# Patient Record
Sex: Female | Born: 1985 | Race: White | Hispanic: No | Marital: Single | Smoking: Current some day smoker
Health system: Southern US, Community
[De-identification: ages and names within clinical notes are randomized; demographics above are authoritative.]

## PROBLEM LIST (undated history)

## (undated) DIAGNOSIS — O09299 Supervision of pregnancy with other poor reproductive or obstetric history, unspecified trimester: Secondary | ICD-10-CM

## (undated) DIAGNOSIS — Z5189 Encounter for other specified aftercare: Secondary | ICD-10-CM

## (undated) DIAGNOSIS — F32A Depression, unspecified: Secondary | ICD-10-CM

## (undated) DIAGNOSIS — F319 Bipolar disorder, unspecified: Secondary | ICD-10-CM

## (undated) DIAGNOSIS — N200 Calculus of kidney: Secondary | ICD-10-CM

## (undated) DIAGNOSIS — T8859XA Other complications of anesthesia, initial encounter: Secondary | ICD-10-CM

## (undated) DIAGNOSIS — A609 Anogenital herpesviral infection, unspecified: Secondary | ICD-10-CM

## (undated) DIAGNOSIS — J45909 Unspecified asthma, uncomplicated: Secondary | ICD-10-CM

## (undated) DIAGNOSIS — F419 Anxiety disorder, unspecified: Secondary | ICD-10-CM

## (undated) DIAGNOSIS — Z87442 Personal history of urinary calculi: Secondary | ICD-10-CM

## (undated) HISTORY — PX: MULTIPLE TOOTH EXTRACTIONS: SHX2053

## (undated) HISTORY — PX: LITHOTRIPSY: SUR834

## (undated) HISTORY — PX: BREAST BIOPSY: SHX20

---

## 2007-06-13 DIAGNOSIS — O139 Gestational [pregnancy-induced] hypertension without significant proteinuria, unspecified trimester: Secondary | ICD-10-CM

## 2008-03-19 ENCOUNTER — Ambulatory Visit: Payer: Self-pay | Admitting: Internal Medicine

## 2008-03-24 ENCOUNTER — Ambulatory Visit (HOSPITAL_COMMUNITY): Admission: RE | Admit: 2008-03-24 | Discharge: 2008-03-24 | Payer: Self-pay | Admitting: Internal Medicine

## 2008-04-05 ENCOUNTER — Ambulatory Visit (HOSPITAL_COMMUNITY): Admission: RE | Admit: 2008-04-05 | Discharge: 2008-04-05 | Payer: Self-pay | Admitting: Internal Medicine

## 2008-04-05 ENCOUNTER — Ambulatory Visit: Payer: Self-pay | Admitting: Internal Medicine

## 2008-04-05 ENCOUNTER — Encounter: Payer: Self-pay | Admitting: Internal Medicine

## 2008-04-15 ENCOUNTER — Encounter (HOSPITAL_COMMUNITY): Admission: RE | Admit: 2008-04-15 | Discharge: 2008-05-15 | Payer: Self-pay | Admitting: Internal Medicine

## 2008-07-07 ENCOUNTER — Ambulatory Visit (HOSPITAL_COMMUNITY): Admission: RE | Admit: 2008-07-07 | Discharge: 2008-07-07 | Payer: Self-pay | Admitting: Obstetrics & Gynecology

## 2009-03-07 ENCOUNTER — Other Ambulatory Visit: Admission: RE | Admit: 2009-03-07 | Discharge: 2009-03-07 | Payer: Self-pay | Admitting: Obstetrics and Gynecology

## 2009-03-24 ENCOUNTER — Emergency Department (HOSPITAL_COMMUNITY): Admission: EM | Admit: 2009-03-24 | Discharge: 2009-03-25 | Payer: Self-pay | Admitting: Emergency Medicine

## 2009-03-29 ENCOUNTER — Emergency Department (HOSPITAL_COMMUNITY): Admission: EM | Admit: 2009-03-29 | Discharge: 2009-03-29 | Payer: Self-pay | Admitting: Emergency Medicine

## 2010-08-29 ENCOUNTER — Ambulatory Visit (HOSPITAL_COMMUNITY): Admission: RE | Admit: 2010-08-29 | Discharge: 2010-08-29 | Payer: Self-pay | Admitting: Orthopaedic Surgery

## 2010-11-05 ENCOUNTER — Encounter: Payer: Self-pay | Admitting: Internal Medicine

## 2011-01-22 LAB — URINALYSIS, ROUTINE W REFLEX MICROSCOPIC
Bilirubin Urine: NEGATIVE
Bilirubin Urine: NEGATIVE
Glucose, UA: NEGATIVE mg/dL
Hgb urine dipstick: NEGATIVE
Ketones, ur: NEGATIVE mg/dL
Ketones, ur: NEGATIVE mg/dL
Nitrite: NEGATIVE
Nitrite: POSITIVE — AB
Protein, ur: NEGATIVE mg/dL
Specific Gravity, Urine: <1.005 — ABNORMAL LOW (ref 1.005–1.030)
pH: 6 (ref 5.0–8.0)
pH: 6 (ref 5.0–8.0)

## 2011-01-22 LAB — URINE CULTURE
Colony Count: 45000
Colony Count: >=100000

## 2011-01-22 LAB — URINE MICROSCOPIC-ADD ON

## 2011-01-22 LAB — PREGNANCY, URINE: Preg Test, Ur: NEGATIVE

## 2011-02-27 NOTE — Op Note (Signed)
NAMELEAR, CARSTENS              ACCOUNT NO.:  [id number]   MEDICAL RECORD NO.:  [id number]          PATIENT TYPE:  AMB   LOCATION:  DAY                           FACILITY:  [facility]   PHYSICIAN:  Lazaro Arms, M.D.   DATE OF BIRTH:  1986/01/10   DATE OF PROCEDURE:  07/07/2008  DATE OF DISCHARGE:                               OPERATIVE REPORT   PREOPERATIVE DIAGNOSIS:  High-grade dysplasia of the cervix.   POSTOPERATIVE DIAGNOSIS:  High-grade dysplasia of the cervix.   SURGEON:  Lazaro Arms, MD   ANESTHESIA:  General endotracheal.   FINDINGS:  The patient had a colposcopy in the office, which biopsy came  back as being high-grade dysplasia.  There is an adequate colposcopy  lesion easily seen.   DESCRIPTION OF OPERATION:  The patient was taken to the operating room  and placed in the lithotomy position after undergoing general  endotracheal anesthesia.  A  Graves speculum was placed, and the cervix  was evaluated with acetic acid through the microscope and once again the  lesions identified.  I did a complete transformation zone ablation a  depth of 5-7 mm laterally taking it to 9-mm centrally in a conical  fashion.  There was a good hemostasis, Monsel was placed.  The patient  tolerated the procedure well.  There was no blood loss.  She was  awakened from anesthesia, taken to the recovery room in good and stable  condition.  All counts were correct.      Lazaro Arms, M.D.  Electronically Signed     LHE/MEDQ  D:  07/07/2008  T:  07/07/2008  Job:  [id number]

## 2011-02-27 NOTE — H&P (Signed)
Bonnie Weaver, Bonnie Weaver              ACCOUNT NO.:  [id number]   MEDICAL RECORD NO.:  [id number]          PATIENT TYPE:  AMB   LOCATION:  DAY                           FACILITY:  [facility]   PHYSICIAN:  R. Roetta Sessions, M.D. DATE OF BIRTH:  [birth year]   DATE OF ADMISSION:  DATE OF DISCHARGE:  LH                              HISTORY & PHYSICAL   REFERRING PHYSICIAN:  Ms. Toma Deiters, FNP, [facility]  Department.   REASON FOR CONSULTATION:  Abdominal pain, rectal bleeding, 40-pound  weight loss.   HISTORY OF PRESENT ILLNESS:  Bonnie Weaver is a pleasant 25-year-  old Caucasian female from [place], [place], who has bipolar  illness.  Is referred by Ms. Toma Deiters, FNP, at the [facility]  Department to evaluate at least a 40-pound weight loss since August of  last year,  intermittent rectal bleeding, epigastric pain and  postprandial nausea and vomiting.  Ms. Hayne gave birth to a healthy  infant back in August.  Postpartum she and her mother report her  weighing 160 pounds, and she has dropped down to 104 currently.  She has  had the above-mentioned symptoms since she delivered.  She denies  diarrhea, constipation.  She has one bowel movement daily and rarely  wipes blood but sees blood in the toilet water frequently.  Her  abdominal pain is really more epigastric and worsened with meals.  She  may not be able to finish a meal and then she has to vomit.  She really  does not have much in the way of reflux symptoms.  No odynophagia, no  dysphagia.  She took Prilosec for about a month and could not tell any  difference in her symptoms.  Her gallbladder remains in situ.  There is  no family history of inflammatory bowel disease, celiac disease or  colorectal neoplasia in any first or second degree relatives.  She has  not had any imaging studies.  She had some blood work done back in  April, which revealed a normal CBC, white count 9.6, H&H of 14.2 and  41.0.   Sedimentation rate was 1.  Vitamin B12 level of 467.   She has been upset since the death of her brother-in-law several months  ago to a drug overdose.  She does not use nonsteroidal agents.  She does  not drink alcohol, and she smokes a half a pack of cigarettes per day.  There is no history of illicit drug use.   PAST MEDICAL HISTORY:  Bipolar illness, asthma.   PAST SURGERIES:  C-section.  Ovary surgery.   CURRENT MEDICATIONS:  Prozac 20 mg daily, Seroquel 25 mg t.i.d., Zantac  150 mg once daily, albuterol inhaler, Tylenol p.r.n., birth control  pills.   ALLERGIES:  CODEINE AND SULFA.   FAMILY HISTORY:  Positive family history for colon cancer in a great  grandmother but no first or second degree relatives.  Mother is alive  with history hypertension, seizures.  Father is alive, in good health.   SOCIAL HISTORY:  The patient is single.  She has 1 child as stated  above.  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  Smokes 1/2 pack of cigarettes per day.  Rarely consumes  alcohol.   REVIEW OF SYSTEMS:  As in history of present illness.  No fever, chills.  No night sweats.  No urinary tract symptoms.   PHYSICAL EXAMINATION:  GENERAL:  Pleasant anxious 21-year lady  accompanied by her mother.  VITAL SIGNS:  Weight 104, height 5'3.  Temperature 97.7, blood pressure  90/68, pulse 72.  SKIN:  Warm and dry.  She has multiple tattoos.  No jaundice.  No  stigmata of chronic liver disease.  HEENT:  No scleral icterus.  Oral cavity no lesions.  No cervical  adenopathy.  CHEST:  Lungs are clear to auscultation.  CARDIAC:  Regular rate and rhythm without murmur, gallop or rub.  BREASTS:  Exam is deferred.  ABDOMEN:  Nondistended.  She has belly button piercing.  Positive bowel  sounds.  She has minimal epigastric tenderness to palpation.  No  appreciable mass or hepatosplenomegaly.  EXTREMITIES:  No edema.  RECTAL:  Good sphincter tone.  No mass in  the rectal vault.  No stool in  the rectal vault.  Minimal mucus, Hemoccult negative.   IMPRESSION:  Bonnie Weaver is a 25 year old lady with bipolar  illness who relates impressive symptoms with a 40-plus-pound weight  loss,  postprandial nausea, vomiting and hematochezia of 10 months'  duration, which started after the birth of her child.   Her symptoms are not likely due to a single diagnosis.  The differential  is broad.  Occult gallbladder disease, peptic ulcer disease and even  celiac disease remain in the differential.  She really does not have any  complaints of bowel function in the way of constipation or diarrhea with  her rectal bleeding.  She will need extensive further evaluation.   RECOMMENDATIONS:  We will go ahead and get a gallbladder ultrasound  initially.  Ultimately she is going to need an EGD and colonoscopy to  further evaluate her symptoms.  I have discussed this approach with her  and her mother.  The risks, benefits, alternatives, limitations have  been reviewed.  All questions answered.  All parties agreeable.  Given  her relatively young age, her psychiatric illness, and polypharmacy,  endoscopic evaluation will be best carried out in the operating room  under propofol sedation.  We will make further recommendations in the  very near future.   I would thank Ms. Toma Deiters, FNP, at the [facility] Department  for allowing me to see this nice lady today.  Further recommendations to  follow.      Jonathon Bellows, M.D.  Electronically Signed     RMR/MEDQ  D:  03/19/2008  T:  03/19/2008  Job:  [id number]

## 2011-02-27 NOTE — Op Note (Signed)
Bonnie Weaver, Bonnie Weaver              ACCOUNT NO.:  [id number]   MEDICAL RECORD NO.:  [id number]          PATIENT TYPE:  AMB   LOCATION:  DAY                           FACILITY:  [facility]   PHYSICIAN:  R. Roetta Sessions, M.D. DATE OF BIRTH:  April 21, 1986   DATE OF PROCEDURE:  04/05/2008  DATE OF DISCHARGE:                               OPERATIVE REPORT   INDICATIONS FOR PROCEDURE:  A 25 year old lady with bipolar illness.  From the CAT scan, we felt we needed to further evaluate recent 40-pound  weight loss, postprandial nausea, vomiting, intermittent hematochezia.  Since seen in the office, gallbladder ultrasound came back negative.  EGD and colonoscopy now being done.  This approach has been discussed  the patient and the patient's parents at length.  Risks, benefits,  alternatives, and limitations have been reviewed, all questions  answered, all parties agreeable.   PROCEDURE NOTE:  O2 saturation, blood pressure, pulse, respirations were  monitored throughout the entire procedure.  Propofol sedation  administered by Dr. Georgann Housekeeper associates.  Cetacaine spray for topical  pharyngeal anesthesia.   INSTRUMENT:  Pentax video chip system.   EGD FINDINGS:  Examination of tubular esophagus revealed normal mucosa.  EG junction easily traversed.   STOMACH:  Gastric cavity was emptied and insufflated well with air.  Thorough examination of the gastric mucosa including retroflexed view of  the proximal stomach esophagogastric junction demonstrated only a small  hiatal hernia.  Pylorus was patent, easily traversed.  Examination of  the bulb, second and third portion revealed no abnormalities.   THERAPEUTIC/DIAGNOSTIC MANEUVERS:  Biopsies of D2 and D3 were taken for  histologic study.  The patient tolerated the procedure well as planned  and was prepared for colonoscopy.  Digital rectal exam revealed no  abnormalities.   ENDOSCOPIC FINDINGS:  The prep was adequate.  Colon:  Colonic mucosa was  surveyed from the rectosigmoid junction through the left transverse,  right colon, appendiceal orifice, ileocecal valve, and cecum.  These  structures were well seen and photographed for the record.  Terminal  ileum was intubated at 15 cm from this level, scope was withdrawn, all  previous mentioned mucosal surfaces were again seen.  The terminal ileum  mucosa as well as the colonic mucosa appeared entirely normal.  Scope  was pulled down to the rectum with examination of rectal mucosa  including retroflexed view of the anal verge and on fos view of anal  canal demonstrated some minimally friable anal canal tissue.  The  patient tolerated the procedure well and was reacted in Endoscopy.   IMPRESSION:  Esophagogastroduodenoscopy, normal esophagus, small hiatal  hernia, otherwise normal stomach D1 through D3.  Assess with biopsy D2  and D3.   COLONOSCOPY FINDINGS:  Friable anal canal otherwise normal rectum,  colon, terminal ileum.   RECOMMENDATIONS:  1. We will start on a proton pump inhibitor empirically, i.e., Aciphex      20 mg orally daily.  We will proceed with a HIDA with fatty meal      challenge to evaluate further her biliary dyskinesia.  2. Suspect trivial anorectal bleeding.  A  10-day course of Anusol-HC      Suppository one per rectum at bedtime.  Further recommendations to      follow.      Jonathon Bellows, M.D.  Electronically Signed     RMR/MEDQ  D:  04/05/2008  T:  04/06/2008  Job:  [id number]

## 2011-04-03 ENCOUNTER — Ambulatory Visit (HOSPITAL_COMMUNITY)
Admission: RE | Admit: 2011-04-03 | Discharge: 2011-04-03 | Disposition: A | Discharge status: Home / Self Care | Payer: Medicaid Other | Source: Ambulatory Visit | Attending: Anesthesiology | Admitting: Anesthesiology

## 2011-04-03 DIAGNOSIS — M545 Low back pain, unspecified: Secondary | ICD-10-CM | POA: Insufficient documentation

## 2011-04-03 DIAGNOSIS — M6281 Muscle weakness (generalized): Secondary | ICD-10-CM | POA: Insufficient documentation

## 2011-04-03 DIAGNOSIS — IMO0001 Reserved for inherently not codable concepts without codable children: Secondary | ICD-10-CM | POA: Insufficient documentation

## 2011-04-17 ENCOUNTER — Ambulatory Visit (HOSPITAL_COMMUNITY): Payer: Medicaid Other | Admitting: Physical Therapy

## 2011-04-19 ENCOUNTER — Ambulatory Visit (HOSPITAL_COMMUNITY): Payer: Medicaid Other | Admitting: *Deleted

## 2011-04-24 ENCOUNTER — Ambulatory Visit (HOSPITAL_COMMUNITY): Payer: Medicaid Other | Admitting: *Deleted

## 2011-04-24 ENCOUNTER — Telehealth (HOSPITAL_COMMUNITY): Payer: Self-pay | Admitting: *Deleted

## 2011-04-26 ENCOUNTER — Inpatient Hospital Stay (HOSPITAL_COMMUNITY)
Admission: RE | Admit: 2011-04-26 | Discharge: 2011-04-26 | Payer: Medicaid Other | Source: Ambulatory Visit | Attending: Physical Therapy | Admitting: Physical Therapy

## 2011-07-12 LAB — HEMOGLOBIN AND HEMATOCRIT, BLOOD
HCT: 40
Hemoglobin: 13.9

## 2011-07-16 LAB — COMPREHENSIVE METABOLIC PANEL
ALT: 10
Alkaline Phosphatase: 76
BUN: 7
CO2: 26
Chloride: 109
GFR calc non Af Amer: >60
Glucose, Bld: 95
Potassium: 3.7
Sodium: 141
Total Bilirubin: 0.4

## 2011-07-16 LAB — URINALYSIS, ROUTINE W REFLEX MICROSCOPIC
Bilirubin Urine: NEGATIVE
Ketones, ur: NEGATIVE
Nitrite: NEGATIVE
Protein, ur: NEGATIVE
Urobilinogen, UA: 0.2
pH: 6

## 2011-07-16 LAB — HCG, QUANTITATIVE, PREGNANCY: hCG, Beta Chain, Quant, S: <2

## 2011-07-16 LAB — CBC
HCT: 40
Hemoglobin: 13.6
RBC: 4.53

## 2011-07-16 LAB — URINE MICROSCOPIC-ADD ON

## 2011-12-23 DIAGNOSIS — R300 Dysuria: Secondary | ICD-10-CM | POA: Insufficient documentation

## 2011-12-23 DIAGNOSIS — Z87442 Personal history of urinary calculi: Secondary | ICD-10-CM | POA: Insufficient documentation

## 2011-12-23 DIAGNOSIS — F319 Bipolar disorder, unspecified: Secondary | ICD-10-CM | POA: Insufficient documentation

## 2011-12-23 DIAGNOSIS — K089 Disorder of teeth and supporting structures, unspecified: Secondary | ICD-10-CM | POA: Insufficient documentation

## 2011-12-23 DIAGNOSIS — R109 Unspecified abdominal pain: Secondary | ICD-10-CM | POA: Insufficient documentation

## 2011-12-23 DIAGNOSIS — K047 Periapical abscess without sinus: Secondary | ICD-10-CM | POA: Insufficient documentation

## 2011-12-23 DIAGNOSIS — F17200 Nicotine dependence, unspecified, uncomplicated: Secondary | ICD-10-CM | POA: Insufficient documentation

## 2011-12-24 ENCOUNTER — Emergency Department (HOSPITAL_COMMUNITY)
Admission: EM | Admit: 2011-12-24 | Discharge: 2011-12-24 | Disposition: A | Discharge status: Home / Self Care | Payer: Medicaid Other | Attending: Emergency Medicine | Admitting: Emergency Medicine

## 2011-12-24 ENCOUNTER — Encounter (HOSPITAL_COMMUNITY): Payer: Self-pay

## 2011-12-24 DIAGNOSIS — K047 Periapical abscess without sinus: Secondary | ICD-10-CM

## 2011-12-24 DIAGNOSIS — R109 Unspecified abdominal pain: Secondary | ICD-10-CM

## 2011-12-24 HISTORY — DX: Calculus of kidney: N20.0

## 2011-12-24 HISTORY — DX: Bipolar disorder, unspecified: F31.9

## 2011-12-24 HISTORY — DX: Anxiety disorder, unspecified: F41.9

## 2011-12-24 LAB — PREGNANCY, URINE: Preg Test, Ur: NEGATIVE

## 2011-12-24 LAB — URINALYSIS, ROUTINE W REFLEX MICROSCOPIC
Hgb urine dipstick: NEGATIVE
Specific Gravity, Urine: 1.02 (ref 1.005–1.030)
Urobilinogen, UA: 1 mg/dL (ref 0.0–1.0)
pH: 7 (ref 5.0–8.0)

## 2011-12-24 MED ORDER — ACETAMINOPHEN 325 MG PO TABS
650.0000 mg | ORAL_TABLET | Freq: Once | ORAL | Status: AC
  Administered 2011-12-24: 650 mg via ORAL
  Filled 2011-12-24: qty 2

## 2011-12-24 MED ORDER — HYDROCODONE-ACETAMINOPHEN 5-325 MG PO TABS
1.0000 | ORAL_TABLET | Freq: Once | ORAL | Status: AC
  Administered 2011-12-24: 1 via ORAL
  Filled 2011-12-24: qty 1

## 2011-12-24 MED ORDER — PENICILLIN V POTASSIUM 500 MG PO TABS: 500.0000 mg | ORAL_TABLET | Freq: Four times a day (QID) | ORAL | Status: AC

## 2011-12-24 MED ORDER — KETOROLAC TROMETHAMINE 60 MG/2ML IM SOLN
60.0000 mg | Freq: Once | INTRAMUSCULAR | Status: AC
  Administered 2011-12-24: 60 mg via INTRAMUSCULAR
  Filled 2011-12-24: qty 2

## 2011-12-24 MED ORDER — IBUPROFEN 800 MG PO TABS: 800.0000 mg | ORAL_TABLET | Freq: Three times a day (TID) | ORAL | Status: AC

## 2011-12-24 MED ORDER — HYDROCODONE-ACETAMINOPHEN 7.5-500 MG/15ML PO SOLN: 7.5000 mL | Freq: Four times a day (QID) | ORAL | Status: AC | PRN

## 2011-12-24 NOTE — ED Notes (Signed)
Left flank pain with hx of kidney stones, also painful urination.  C/o right upper dental pain x 1 week.

## 2011-12-24 NOTE — ED Provider Notes (Signed)
History     CSN: [id number]  Arrival date & time 12/23/11  2349   First MD Initiated Contact with Patient 12/24/11 0054      Chief Complaint  Patient presents with  . Flank Pain  . Dental Pain    (Consider location/radiation/quality/duration/timing/severity/associated sxs/prior treatment) HPI Left flank pain started today with history of kidney stones. Patient feels like she has another kidney stone. She has not noticed any hematuria. She has had some dysuria but no urgency or frequency. No fevers or chills. No nausea vomiting or diarrhea. Patient also complaining of right upper dental pain for last week and unable to see her dentist until next week. Again no fevers. No difficulty swallowing and no difficulty breathing. It hurts to chew and she has no alleviating factors for this pain. Moderate in severity. Pain is sharp in quality and not radiating.  Past Medical History  Diagnosis Date  . Kidney stone   . Bipolar 1 disorder   . Anxiety     Past Surgical History  Procedure Date  . Cesarean section   . Lithotripsy     No family history on file.  History  Substance Use Topics  . Smoking status: Current Everyday Smoker -- 1.0 packs/day  . Smokeless tobacco: Not on file  . Alcohol Use: No    OB History    Grav Para Term Preterm Abortions TAB SAB Ect Mult Living                  Review of Systems  Constitutional: Negative for fever and chills.  HENT: Positive for dental problem. Negative for neck pain and neck stiffness.   Eyes: Negative for pain.  Respiratory: Negative for shortness of breath.   Cardiovascular: Negative for chest pain.  Gastrointestinal: Negative for abdominal pain.  Genitourinary: Positive for flank pain. Negative for dysuria.  Musculoskeletal: Negative for back pain.  Skin: Negative for rash.  Neurological: Negative for headaches.  All other systems reviewed and are negative.    Allergies  Sulfa antibiotics  Home Medications   Current  Outpatient Rx  Name Route Sig Dispense Refill  . CLONAZEPAM 0.5 MG PO TABS Oral Take 0.5 mg by mouth 4 (four) times daily as needed.    Marland Kitchen FLUOXETINE HCL 20 MG PO CAPS Oral Take 20 mg by mouth daily.    Marland Kitchen LAMOTRIGINE 200 MG PO TABS Oral Take 200 mg by mouth daily.      BP 126/74  Pulse 88  Temp(Src) 98.2 F (36.8 C) (Oral)  Resp 16  Ht 5\' 2"  (1.575 m)  Wt 115 lb (52.164 kg)  BMI 21.03 kg/m2  SpO2 100%  LMP 12/06/2011  Physical Exam  Constitutional: She is oriented to person, place, and time. She appears well-developed and well-nourished.  HENT:  Head: Normocephalic and atraumatic.       very poor dentition. Multiple caries. Right upper first molar tender to palpation with no surrounding fluctuance, swelling. No trismus. No facial swelling or erythema. Uvula midline.  Eyes: Conjunctivae and EOM are normal. Pupils are equal, round, and reactive to light.  Neck: Trachea normal. Neck supple. No thyromegaly present.  Cardiovascular: Normal rate, regular rhythm, S1 normal, S2 normal and normal pulses.     No systolic murmur is present   No diastolic murmur is present  Pulses:      Radial pulses are 2+ on the right side, and 2+ on the left side.  Pulmonary/Chest: Effort normal and breath sounds normal. She has no  wheezes. She has no rhonchi. She has no rales. She exhibits no tenderness.  Abdominal: Soft. Normal appearance and bowel sounds are normal. There is no tenderness. There is no CVA tenderness and negative Murphy's sign.       Localizes discomfort to left flank with no reproducible tenderness. No CVA tenderness. No peritonitis.  Musculoskeletal:       BLE:s Calves nontender, no cords or erythema, negative Homans sign  Neurological: She is alert and oriented to person, place, and time. She has normal strength. No cranial nerve deficit or sensory deficit. GCS eye subscore is 4. GCS verbal subscore is 5. GCS motor subscore is 6.  Skin: Skin is warm and dry. No rash noted. She is not  diaphoretic.  Psychiatric: Her speech is normal.       Cooperative and appropriate    ED Course  Dental Date/Time: 12/24/2011 2:50 AM Performed by: Sunnie Nielsen Authorized by: Sunnie Nielsen Consent: Verbal consent not obtained. Risks and benefits: risks, benefits and alternatives were discussed Consent given by: patient Patient understanding: patient states understanding of the procedure being performed Patient consent: the patient's understanding of the procedure matches consent given Procedure consent: procedure consent matches procedure scheduled Patient identity confirmed: verbally with patient Time out: Immediately prior to procedure a "time out" was called to verify the correct patient, procedure, equipment, support staff and site/side marked as required. Local anesthesia used: yes Local anesthetic: bupivacaine 0.5% without epinephrine Anesthetic total: 2 ml Patient tolerance: Patient tolerated the procedure well with no immediate complications. Comments: Pain improved.   (including critical care time)   Labs Reviewed  URINALYSIS, ROUTINE W REFLEX MICROSCOPIC  PREGNANCY, URINE      MDM   Patient here with 2 complaints. She has left flank pain and dysuria. No exam findings to suggest acute intra-abdominal process. No hematuria and no UTI. Plan close urology followup with referral provided. Pain medications provided. Her second complaint is dental pain. Presentation suggests possible dental abscess. Dental block as above improve symptoms. Prescription for antibiotics provided and plan dental followup - she agrees to keep her appointment a week with her dentist Dr. Bonnielee Haff, MD 12/24/11 [phone]

## 2011-12-29 ENCOUNTER — Emergency Department (HOSPITAL_COMMUNITY): Payer: Medicaid Other

## 2011-12-29 ENCOUNTER — Emergency Department (HOSPITAL_COMMUNITY)
Admission: EM | Admit: 2011-12-29 | Discharge: 2011-12-29 | Disposition: A | Discharge status: Home / Self Care | Payer: Medicaid Other | Attending: Emergency Medicine | Admitting: Emergency Medicine

## 2011-12-29 ENCOUNTER — Encounter (HOSPITAL_COMMUNITY): Payer: Self-pay | Admitting: *Deleted

## 2011-12-29 DIAGNOSIS — Z79899 Other long term (current) drug therapy: Secondary | ICD-10-CM | POA: Insufficient documentation

## 2011-12-29 DIAGNOSIS — S93409A Sprain of unspecified ligament of unspecified ankle, initial encounter: Secondary | ICD-10-CM | POA: Insufficient documentation

## 2011-12-29 DIAGNOSIS — F411 Generalized anxiety disorder: Secondary | ICD-10-CM | POA: Insufficient documentation

## 2011-12-29 DIAGNOSIS — S93402A Sprain of unspecified ligament of left ankle, initial encounter: Secondary | ICD-10-CM

## 2011-12-29 DIAGNOSIS — M25579 Pain in unspecified ankle and joints of unspecified foot: Secondary | ICD-10-CM | POA: Insufficient documentation

## 2011-12-29 DIAGNOSIS — W1789XA Other fall from one level to another, initial encounter: Secondary | ICD-10-CM | POA: Insufficient documentation

## 2011-12-29 DIAGNOSIS — F319 Bipolar disorder, unspecified: Secondary | ICD-10-CM | POA: Insufficient documentation

## 2011-12-29 MED ORDER — IBUPROFEN 800 MG PO TABS
800.0000 mg | ORAL_TABLET | Freq: Once | ORAL | Status: AC
  Administered 2011-12-29: 800 mg via ORAL
  Filled 2011-12-29: qty 1

## 2011-12-29 MED ORDER — METHOCARBAMOL 500 MG PO TABS: ORAL_TABLET | ORAL | Status: DC

## 2011-12-29 MED ORDER — IBUPROFEN 800 MG PO TABS: ORAL_TABLET | ORAL | Status: DC

## 2011-12-29 MED ORDER — METHOCARBAMOL 500 MG PO TABS
1000.0000 mg | ORAL_TABLET | Freq: Once | ORAL | Status: AC
  Administered 2011-12-29: 1000 mg via ORAL
  Filled 2011-12-29: qty 2

## 2011-12-29 NOTE — ED Notes (Signed)
Left ankle pain after jumping off porch to catch cat this morning. NAD.

## 2011-12-29 NOTE — ED Provider Notes (Signed)
History     CSN: [id number]  Arrival date & time 12/29/11  1416   First MD Initiated Contact with Patient 12/29/11 1606      Chief Complaint  Patient presents with  . Ankle Pain    (Consider location/radiation/quality/duration/timing/severity/associated sxs/prior treatment) Patient is a 26 y.o. female presenting with ankle pain. The history is provided by the patient.  Ankle Pain  The incident occurred yesterday. The incident occurred at home. The injury mechanism was a fall. The pain is present in the left ankle. The quality of the pain is described as aching and throbbing. The pain is severe. The pain has been constant since onset. Pertinent negatives include no numbness, no loss of motion and no tingling. She reports no foreign bodies present. The symptoms are aggravated by bearing weight. She has tried nothing for the symptoms. The treatment provided no relief.    Past Medical History  Diagnosis Date  . Kidney stone   . Bipolar 1 disorder   . Anxiety     Past Surgical History  Procedure Date  . Cesarean section   . Lithotripsy     No family history on file.  History  Substance Use Topics  . Smoking status: Current Everyday Smoker -- 1.0 packs/day  . Smokeless tobacco: Not on file  . Alcohol Use: No    OB History    Grav Para Term Preterm Abortions TAB SAB Ect Mult Living                  Review of Systems  Musculoskeletal:       Ankle pain  Neurological: Negative for tingling and numbness.    Allergies  Sulfa antibiotics  Home Medications   Current Outpatient Rx  Name Route Sig Dispense Refill  . CLONAZEPAM 0.5 MG PO TABS Oral Take 0.5 mg by mouth 4 (four) times daily as needed.    Marland Kitchen FLUOXETINE HCL 20 MG PO CAPS Oral Take 20 mg by mouth daily.    Marland Kitchen HYDROCODONE-ACETAMINOPHEN 7.5-500 MG/15ML PO SOLN Oral Take 7.5 mLs by mouth every 6 (six) hours as needed for pain (Take at bedtime as needed severe pain.. No driving while medicated.). 30 mL 0  .  IBUPROFEN 800 MG PO TABS Oral Take 1 tablet (800 mg total) by mouth 3 (three) times daily. 21 tablet 0  . LAMOTRIGINE 200 MG PO TABS Oral Take 200 mg by mouth daily.    Marland Kitchen PENICILLIN V POTASSIUM 500 MG PO TABS Oral Take 1 tablet (500 mg total) by mouth 4 (four) times daily. 40 tablet 0    BP 119/77  Pulse 111  Temp(Src) 98.1 F (36.7 C) (Oral)  Resp 16  Ht 5\' 2"  (1.575 m)  Wt 120 lb (54.432 kg)  BMI 21.95 kg/m2  SpO2 98%  LMP 12/06/2011  Physical Exam  Nursing note and vitals reviewed. Constitutional: She is oriented to person, place, and time. She appears well-developed and well-nourished.  Non-toxic appearance.  HENT:  Head: Normocephalic.  Right Ear: Tympanic membrane and external ear normal.  Left Ear: Tympanic membrane and external ear normal.  Eyes: EOM and lids are normal. Pupils are equal, round, and reactive to light.  Neck: Normal range of motion. Neck supple. Carotid bruit is not present.  Cardiovascular: Normal rate, regular rhythm, normal heart sounds, intact distal pulses and normal pulses.   Pulmonary/Chest: Breath sounds normal. No respiratory distress.  Abdominal: Soft. Bowel sounds are normal. There is no tenderness. There is no guarding.  Musculoskeletal:  Normal range of motion.       Pain with ROM of the left ankle. Pain with movement of the achilles. Soreness of the calf muscle area.  Lymphadenopathy:       Head (right side): No submandibular adenopathy present.       Head (left side): No submandibular adenopathy present.    She has no cervical adenopathy.  Neurological: She is alert and oriented to person, place, and time. She has normal strength. No cranial nerve deficit or sensory deficit. She exhibits normal muscle tone. Coordination normal.  Skin: Skin is warm and dry.  Psychiatric: Her speech is normal. Her mood appears anxious.    ED Course  Procedures (including critical care time)  Labs Reviewed - No data to display Dg Ankle Complete  Left  12/29/2011  *RADIOLOGY REPORT*  Clinical Data: Ankle pain post fall  LEFT ANKLE COMPLETE - 3+ VIEW  Comparison: None.  Findings: Three views of the left ankle submitted.  No acute fracture or subluxation.  Ankle mortise is preserved.  IMPRESSION: No acute fracture or subluxation.  Original Report Authenticated By: Natasha Mead, M.D.     Dx: Left ankle sprain   MDM  I have reviewed nursing notes, vital signs, and all appropriate lab and imaging results for this patient. Pt jumped off a porch last night. "landed wrong" on the left foot/ankle. Xray negative for fracture or dislocation. Sore at the gastroc.  Sore at the achilles, but intact. The plan at this time is for ASO splint, ice, and crutches. Rx for ibuprofen 800mg  and robaxin 500mg  given.       Kathie Dike, [place] 12/29/11 [phone]

## 2011-12-29 NOTE — Discharge Instructions (Signed)
Please keep the left ankle iced and elevated today and tomorrow 3/17. Use crutches until able to safely apply weight. Please use the ankle splint for the next 10 to 14 days. Ibuprofen three times daily after a meal. Robaxin three times daily for spasm/pain. Thanks.Ankle Sprain You have a sprained ankle. When you twist or sprain your ankle, the ligaments that hold the joint together are injured. This usually causes a lot of swelling and pain. Although these injuries can be quite severe, proper treatment will reduce your pain, shorten the period of disability, and help prevent re-injury. To treat a sprained ankle you should:  Elevate your ankle for the next 2 to 4 days.   During this period apply ice packs to the injury for 20 to 30 minutes every 2 to 3 hours.   Keep the ankle wrapped in a compression bandage or splint as long as it is painful or swollen.   Do not walk on your ankle if it still hurts. This can slow the healing. Gentle range of motion exercises, however, can help decrease disability.   Use crutches if necessary until weight bearing is painless.   Prescription pain medicine may be needed to relieve discomfort.  A plaster or fiberglass splint may be applied initially. As your sprain improves, air, foam or gel-lined braces can be used to protect the ankle from further injury until the joint is completely healed. Ankle rehabilitation exercises may also be used to speed your recovery and make the joint more stable. Most moderate ankle sprains will heal completely in 6 weeks. However, if the sprain is severe, a cast or even surgery may be needed. Restrict your activities and see your doctor for follow-up as advised. If you have persistent pain, further evaluation and x-rays may be needed. Document Released: 11/08/2004 Document Revised: 09/20/2011 Document Reviewed: 10/02/2008 [facility] Patient Information 2012 [place], [place].

## 2011-12-30 NOTE — ED Provider Notes (Signed)
Medical screening examination/treatment/procedure(s) were performed by non-physician practitioner and as supervising physician I was immediately available for consultation/collaboration.  Salinda Snedeker, MD 12/30/11 0050 

## 2012-03-17 ENCOUNTER — Emergency Department (HOSPITAL_COMMUNITY): Payer: Medicaid Other

## 2012-03-17 ENCOUNTER — Encounter (HOSPITAL_COMMUNITY): Payer: Self-pay

## 2012-03-17 ENCOUNTER — Emergency Department (HOSPITAL_COMMUNITY)
Admission: EM | Admit: 2012-03-17 | Discharge: 2012-03-17 | Disposition: A | Discharge status: Home / Self Care | Payer: Medicaid Other | Attending: Emergency Medicine | Admitting: Emergency Medicine

## 2012-03-17 DIAGNOSIS — M25531 Pain in right wrist: Secondary | ICD-10-CM

## 2012-03-17 DIAGNOSIS — F319 Bipolar disorder, unspecified: Secondary | ICD-10-CM | POA: Insufficient documentation

## 2012-03-17 DIAGNOSIS — F411 Generalized anxiety disorder: Secondary | ICD-10-CM | POA: Insufficient documentation

## 2012-03-17 DIAGNOSIS — M25519 Pain in unspecified shoulder: Secondary | ICD-10-CM | POA: Insufficient documentation

## 2012-03-17 DIAGNOSIS — F17200 Nicotine dependence, unspecified, uncomplicated: Secondary | ICD-10-CM | POA: Insufficient documentation

## 2012-03-17 DIAGNOSIS — M542 Cervicalgia: Secondary | ICD-10-CM | POA: Insufficient documentation

## 2012-03-17 DIAGNOSIS — M25539 Pain in unspecified wrist: Secondary | ICD-10-CM | POA: Insufficient documentation

## 2012-03-17 DIAGNOSIS — M25511 Pain in right shoulder: Secondary | ICD-10-CM

## 2012-03-17 DIAGNOSIS — Z79899 Other long term (current) drug therapy: Secondary | ICD-10-CM | POA: Insufficient documentation

## 2012-03-17 MED ORDER — IBUPROFEN 800 MG PO TABS
800.0000 mg | ORAL_TABLET | Freq: Once | ORAL | Status: AC
  Administered 2012-03-17: 800 mg via ORAL

## 2012-03-17 MED ORDER — IBUPROFEN 800 MG PO TABS
ORAL_TABLET | ORAL | Status: AC
  Administered 2012-03-17: 800 mg via ORAL
  Filled 2012-03-17: qty 1

## 2012-03-17 MED ORDER — IBUPROFEN 800 MG PO TABS: 800.0000 mg | ORAL_TABLET | Freq: Three times a day (TID) | ORAL | Status: AC

## 2012-03-17 MED ORDER — CYCLOBENZAPRINE HCL 10 MG PO TABS: 10.0000 mg | ORAL_TABLET | Freq: Two times a day (BID) | ORAL | Status: AC | PRN
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
## 2012-03-17 NOTE — ED Notes (Signed)
Patient request something more for pain, MD notified. No new orders given. Patient states that she should have went to [place] instead.

## 2012-03-17 NOTE — Discharge Instructions (Signed)
X-rays are normal. Will be sore for several days. Medications for pain and muscle relaxers. Ice pack
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
## 2018-06-02 ENCOUNTER — Encounter (INDEPENDENT_AMBULATORY_CARE_PROVIDER_SITE_OTHER): Payer: Self-pay

## 2018-06-02 ENCOUNTER — Encounter: Payer: Self-pay | Admitting: Obstetrics and Gynecology

## 2018-06-02 ENCOUNTER — Ambulatory Visit: Payer: Medicaid Other | Admitting: Obstetrics and Gynecology

## 2018-06-02 VITALS — BP 109/73 | HR 96 | Ht 62.0 in | Wt 182.2 lb

## 2018-06-02 DIAGNOSIS — Z3046 Encounter for surveillance of implantable subdermal contraceptive: Secondary | ICD-10-CM

## 2018-06-02 DIAGNOSIS — Z3049 Encounter for surveillance of other contraceptives: Secondary | ICD-10-CM | POA: Diagnosis not present

## 2018-06-02 NOTE — Progress Notes (Signed)
Patient ID: Bonnie Obershley K Frink, female   DOB: 21-Jul-1986, 32 y.o.   MRN: [id number]  [facility]   GYNECOLOGY CLINIC PROCEDURE NOTE  Bonnie Weaver is a 32 y.o. No obstetric history on file. here for Nexplanon removal. Bonnie Weaver gained a lot of weight on nexplanon. And would like to get off to see if she will lose weight. She is not sexually active and has just recently started having periods 2-3 months ago, but has no other gynecologic concerns.  Nexplanon Removal Patient identified, informed consent performed, consent signed.   Appropriate time out taken. Nexplanon site identified.  Area prepped in usual sterile fashon. One ml of 1% lidocaine was used to anesthetize the area at the distal end of the implant. A small stab incision was made right beside the implant on the distal portion.  The Nexplanon rod was grasped using hemostats and removed without difficulty.  There was minimal blood loss. There were no complications.  3 ml of 1% lidocaine was injected around the incision for post-procedure analgesia.  Steri-strips were applied over the small incision.  A pressure bandage was applied to reduce any bruising.  The patient tolerated the procedure well and was given post procedure instructions.  Patient is planning to use condom for contraception but is not sexually active at this time.   By signing my name below, I, Arnette NorrisMari Johnson, attest that this documentation has been prepared under the direction and in the presence of Tilda BurrowFerguson, Denelle Capurro V, MD. Electronically Signed: Arnette NorrisMari Johnson Medical Scribe. 06/02/18. 3:32 PM.  I personally performed the services described in this documentation, which was SCRIBED in my presence. The recorded information has been reviewed and considered accurate. It has been edited as necessary during review. Tilda BurrowJohn V Alayjah Boehringer, MD

## 2018-11-26 ENCOUNTER — Encounter: Payer: Self-pay | Admitting: Orthopaedic Surgery

## 2018-11-26 ENCOUNTER — Ambulatory Visit: Payer: Medicaid Other | Admitting: Orthopaedic Surgery

## 2018-11-26 VITALS — BP 113/75 | HR 90 | Ht 62.0 in | Wt 187.0 lb

## 2018-11-26 DIAGNOSIS — M79642 Pain in left hand: Secondary | ICD-10-CM | POA: Diagnosis not present

## 2018-11-26 DIAGNOSIS — M25531 Pain in right wrist: Secondary | ICD-10-CM | POA: Diagnosis not present

## 2018-11-26 DIAGNOSIS — M79641 Pain in right hand: Secondary | ICD-10-CM

## 2018-11-26 DIAGNOSIS — M25532 Pain in left wrist: Secondary | ICD-10-CM

## 2018-11-26 MED ORDER — NAPROXEN 500 MG PO TABS: 500.0000 mg | ORAL_TABLET | Freq: Two times a day (BID) | ORAL | 5 refills | Status: DC

## 2018-11-26 NOTE — Progress Notes (Signed)
Subjective:    Patient ID: Bonnie Weaver, female    DOB: 1986-02-01, 33 y.o.   MRN: [id number]  HPI She has had pain and swelling of both hands for about six to eight months.  She has numbness as well. She has no color changes.  Cold weather makes them worse.  She has more pain first thing in the mornings and also numbness.  She puts her hands in warm water to make them better.  She has no trauma.  She has tried Advil two tablets three times a day and they help some but not the numbness.  She has some pains in other joints, the elbows, knees and neck.  She has seen her family doctor at [facility] and is referred here.   Review of Systems  Constitutional: Positive for activity change.  Musculoskeletal: Positive for arthralgias, joint swelling and myalgias.  Psychiatric/Behavioral: The patient is nervous/anxious.   All other systems reviewed and are negative.  For Review of Systems, all other systems reviewed and are negative.  The following is a summary of the past history medically, past history surgically, known current medicines, social history and family history.  This information is gathered electronically by the computer from prior information and documentation.  I review this each visit and have found including this information at this point in the chart is beneficial and informative.   Past Medical History:  Diagnosis Date  . Anxiety   . Bipolar 1 disorder (HCC)   . Kidney stone     Past Surgical History:  Procedure Laterality Date  . CESAREAN SECTION    . LITHOTRIPSY      Current Outpatient Medications on File Prior to Visit  Medication Sig Dispense Refill  . clonazePAM (KLONOPIN) 0.5 MG tablet Take 0.5 mg by mouth 4 (four) times daily as needed.    Marland Kitchen FLUoxetine (PROZAC) 20 MG capsule Take 20 mg by mouth daily.    Marland Kitchen ibuprofen (ADVIL,MOTRIN) 800 MG tablet 1 po tid after a meal 21 tablet 0  . lamoTRIgine (LAMICTAL) 200 MG tablet Take 200 mg by mouth daily.      . methocarbamol (ROBAXIN) 500 MG tablet 2 po tid for spasm/pain (Patient not taking: Reported on 06/02/2018) 30 tablet 0   No current facility-administered medications on file prior to visit.     Social History   Socioeconomic History  . Marital status: Single    Spouse name: Not on file  . Number of children: Not on file  . Years of education: Not on file  . Highest education level: Not on file  Occupational History  . Not on file  Social Needs  . Financial resource strain: Not on file  . Food insecurity:    Worry: Not on file    Inability: Not on file  . Transportation needs:    Medical: Not on file    Non-medical: Not on file  Tobacco Use  . Smoking status: Current Every Day Smoker    Packs/day: 1.00    Types: Cigarettes  Substance and Sexual Activity  . Alcohol use: No  . Drug use: No  . Sexual activity: Not on file  Lifestyle  . Physical activity:    Days per week: Not on file    Minutes per session: Not on file  . Stress: Not on file  Relationships  . Social connections:    Talks on phone: Not on file    Gets together: Not on file    Attends  religious service: Not on file    Active member of club or organization: Not on file    Attends meetings of clubs or organizations: Not on file    Relationship status: Not on file  . Intimate partner violence:    Fear of current or ex partner: Not on file    Emotionally abused: Not on file    Physically abused: Not on file    Forced sexual activity: Not on file  Other Topics Concern  . Not on file  Social History Narrative  . Not on file    Family History  Problem Relation Age of Onset  . Diabetes Maternal Grandmother   . Lung cancer Maternal Grandfather   . Hypertension Father   . Other Mother        sepsis  . Asthma Sister     BP 113/75   Pulse 90   Ht 5\' 2"  (1.575 m)   Wt 187 lb (84.8 kg)   BMI 34.20 kg/m   Body mass index is 34.2 kg/m.      Objective:   Physical Exam Constitutional:       Appearance: She is well-developed.  HENT:     Head: Normocephalic and atraumatic.  Eyes:     Conjunctiva/sclera: Conjunctivae normal.     Pupils: Pupils are equal, round, and reactive to light.  Neck:     Musculoskeletal: Normal range of motion and neck supple.  Cardiovascular:     Rate and Rhythm: Normal rate and regular rhythm.  Pulmonary:     Effort: Pulmonary effort is normal.  Abdominal:     Palpations: Abdomen is soft.  Musculoskeletal:       Hands:  Skin:    General: Skin is warm and dry.  Neurological:     Mental Status: She is alert and oriented to person, place, and time.     Cranial Nerves: No cranial nerve deficit.     Motor: No abnormal muscle tone.     Coordination: Coordination normal.     Deep Tendon Reflexes: Reflexes are normal and symmetric. Reflexes normal.  Psychiatric:        Behavior: Behavior normal.        Thought Content: Thought content normal.        Judgment: Judgment normal.           Assessment & Plan:   Encounter Diagnoses  Name Primary?  . Pain in both wrists Yes  . Pain in both hands    I am concerned about arthritis of the hands and fingers.  I will begin Naprosyn 500 po bid pc.   I am also concerned about possible carpal tunnel, more by her history.  I will get EMGs.  Return after EMGs.  Call if any problem.  Precautions discussed.   Electronically Signed Darreld McleanWayne Rashidi Loh, MD 2/12/20202:51 PM

## 2018-12-23 ENCOUNTER — Encounter: Payer: Self-pay | Admitting: Orthopaedic Surgery

## 2018-12-23 ENCOUNTER — Ambulatory Visit (INDEPENDENT_AMBULATORY_CARE_PROVIDER_SITE_OTHER): Payer: Medicaid Other | Admitting: Orthopaedic Surgery

## 2018-12-23 VITALS — BP 115/72 | HR 72 | Ht 62.0 in | Wt 187.0 lb

## 2018-12-23 DIAGNOSIS — M79641 Pain in right hand: Secondary | ICD-10-CM | POA: Diagnosis not present

## 2018-12-23 DIAGNOSIS — M25532 Pain in left wrist: Secondary | ICD-10-CM

## 2018-12-23 DIAGNOSIS — M79642 Pain in left hand: Secondary | ICD-10-CM

## 2018-12-23 DIAGNOSIS — M25531 Pain in right wrist: Secondary | ICD-10-CM | POA: Diagnosis not present

## 2018-12-23 DIAGNOSIS — F17210 Nicotine dependence, cigarettes, uncomplicated: Secondary | ICD-10-CM

## 2018-12-23 NOTE — Patient Instructions (Signed)
Steps to Quit Smoking    Smoking tobacco can be bad for your health. It can also affect almost every organ in your body. Smoking puts you and people around you at risk for many serious long-lasting (chronic) diseases. Quitting smoking is hard, but it is one of the best things that you can do for your health. It is never too late to quit.  What are the benefits of quitting smoking?  When you quit smoking, you lower your risk for getting serious diseases and conditions. They can include:  · Lung cancer or lung disease.  · Heart disease.  · Stroke.  · Heart attack.  · Not being able to have children (infertility).  · Weak bones (osteoporosis) and broken bones (fractures).  If you have coughing, wheezing, and shortness of breath, those symptoms may get better when you quit. You may also get sick less often. If you are pregnant, quitting smoking can help to lower your chances of having a baby of low birth weight.  What can I do to help me quit smoking?  Talk with your doctor about what can help you quit smoking. Some things you can do (strategies) include:  · Quitting smoking totally, instead of slowly cutting back how much you smoke over a period of time.  · Going to in-person counseling. You are more likely to quit if you go to many counseling sessions.  · Using resources and support systems, such as:  ? Online chats with a counselor.  ? Phone quitlines.  ? Printed self-help materials.  ? Support groups or group counseling.  ? Text messaging programs.  ? Mobile phone apps or applications.  · Taking medicines. Some of these medicines may have nicotine in them. If you are pregnant or breastfeeding, do not take any medicines to quit smoking unless your doctor says it is okay. Talk with your doctor about counseling or other things that can help you.  Talk with your doctor about using more than one strategy at the same time, such as taking medicines while you are also going to in-person counseling. This can help make  quitting easier.  What things can I do to make it easier to quit?  Quitting smoking might feel very hard at first, but there is a lot that you can do to make it easier. Take these steps:  · Talk to your family and friends. Ask them to support and encourage you.  · Call phone quitlines, reach out to support groups, or work with a counselor.  · Ask people who smoke to not smoke around you.  · Avoid places that make you want (trigger) to smoke, such as:  ? Bars.  ? Parties.  ? Smoke-break areas at work.  · Spend time with people who do not smoke.  · Lower the stress in your life. Stress can make you want to smoke. Try these things to help your stress:  ? Getting regular exercise.  ? Deep-breathing exercises.  ? Yoga.  ? Meditating.  ? Doing a body scan. To do this, close your eyes, focus on one area of your body at a time from head to toe, and notice which parts of your body are tense. Try to relax the muscles in those areas.  · Download or buy apps on your mobile phone or tablet that can help you stick to your quit plan. There are many free apps, such as QuitGuide from the CDC (Centers for Disease Control and Prevention). You can find more   support from smokefree.gov and other websites.  This information is not intended to replace advice given to you by your health care provider. Make sure you discuss any questions you have with your health care provider.  Document Released: 07/28/2009 Document Revised: 05/29/2016 Document Reviewed: 02/15/2015  Elsevier Interactive Patient Education © 2019 [employer]vier Inc.

## 2018-12-23 NOTE — Progress Notes (Signed)
[name], female DOB:06/20/1986, 33 y.o. TMA:[id number]  Chief Complaint  Patient presents with  . Hand Pain    bilateral     HPI  JAQUESHA Weaver is a 33 y.o. female who has bilateral hand pain.  She had EMGs and these were negative.  I have informed her of the findings. She still has hand pain more early in the morning and it gets better. Rheumatoid arthritis runs in the family but she might have early signs of this.  She has no new trauma, no redness.   Body mass index is 34.2 kg/m.  ROS  Review of Systems  Constitutional: Positive for activity change.  Musculoskeletal: Positive for arthralgias, joint swelling and myalgias.  Psychiatric/Behavioral: The patient is nervous/anxious.   All other systems reviewed and are negative.   All other systems reviewed and are negative.  The following is a summary of the past history medically, past history surgically, known current medicines, social history and family history.  This information is gathered electronically by the computer from prior information and documentation.  I review this each visit and have found including this information at this point in the chart is beneficial and informative.    Past Medical History:  Diagnosis Date  . Anxiety   . Bipolar 1 disorder (HCC)   . Kidney stone     Past Surgical History:  Procedure Laterality Date  . CESAREAN SECTION    . LITHOTRIPSY      Family History  Problem Relation Age of Onset  . Diabetes Maternal Grandmother   . Lung cancer Maternal Grandfather   . Hypertension Father   . Other Mother        sepsis  . Asthma Sister     Social History Social History   Tobacco Use  . Smoking status: Current Every Day Smoker    Packs/day: 1.00    Types: Cigarettes  . Smokeless tobacco: Never Used  Substance Use Topics  . Alcohol use: No  . Drug use: No    Allergies  Allergen Reactions  . Sulfa Antibiotics     Current Outpatient Medications   Medication Sig Dispense Refill  . amphetamine-dextroamphetamine (ADDERALL) 20 MG tablet     . ARIPiprazole (ABILIFY) 15 MG tablet     . clonazePAM (KLONOPIN) 0.5 MG tablet Take 0.5 mg by mouth 4 (four) times daily as needed.    Marland Kitchen FLUoxetine (PROZAC) 20 MG capsule Take 20 mg by mouth daily.    Marland Kitchen gabapentin (NEURONTIN) 100 MG capsule     . ibuprofen (ADVIL,MOTRIN) 800 MG tablet 1 po tid after a meal 21 tablet 0  . lamoTRIgine (LAMICTAL) 100 MG tablet     . lamoTRIgine (LAMICTAL) 200 MG tablet Take 200 mg by mouth daily.    . naproxen (NAPROSYN) 500 MG tablet Take 1 tablet (500 mg total) by mouth 2 (two) times daily with a meal. 60 tablet 5  . methocarbamol (ROBAXIN) 500 MG tablet 2 po tid for spasm/pain (Patient not taking: Reported on 06/02/2018) 30 tablet 0   No current facility-administered medications for this visit.      Physical Exam  Blood pressure 115/72, pulse 72, height 5\' 2"  (1.575 m), weight 187 lb (84.8 kg).  Constitutional: overall normal hygiene, normal nutrition, well developed, normal grooming, normal body habitus. Assistive device:none  Musculoskeletal: gait and station Limp none, muscle tone and strength are normal, no tremors or atrophy is present.  .  Neurological: coordination overall normal.  Deep tendon  reflex/nerve stretch intact.  Sensation normal.  Cranial nerves II-XII intact.   Skin:   Normal overall no scars, lesions, ulcers or rashes. No psoriasis.  Psychiatric: Alert and oriented x 3.  Recent memory intact, remote memory unclear.  Normal mood and affect. Well groomed.  Good eye contact.  Cardiovascular: overall no swelling, no varicosities, no edema bilaterally, normal temperatures of the legs and arms, no clubbing, cyanosis and good capillary refill.  Lymphatic: palpation is normal.  Both hands have some diffuse tenderness.  She has no swelling.  NV intact. She has no redness.  All other systems reviewed and are negative   The patient has been  educated about the nature of the problem(s) and counseled on treatment options.  The patient appeared to understand what I have discussed and is in agreement with it.  Encounter Diagnoses  Name Primary?  . Pain in both wrists Yes  . Pain in both hands   . Cigarette nicotine dependence without complication     PLAN Call if any problems.  Precautions discussed.  Continue current medications.   Return to clinic 1 month   Electronically Signed Darreld Mclean, MD 3/10/20203:31 PM

## 2019-01-21 ENCOUNTER — Ambulatory Visit: Payer: Medicaid Other | Admitting: Orthopaedic Surgery

## 2019-02-17 ENCOUNTER — Ambulatory Visit: Payer: Self-pay | Admitting: Orthopaedic Surgery

## 2019-02-19 ENCOUNTER — Other Ambulatory Visit: Payer: Self-pay

## 2019-02-19 ENCOUNTER — Encounter: Payer: Self-pay | Admitting: Orthopaedic Surgery

## 2019-02-19 ENCOUNTER — Ambulatory Visit: Payer: Medicaid Other | Admitting: Orthopaedic Surgery

## 2019-02-19 ENCOUNTER — Ambulatory Visit (INDEPENDENT_AMBULATORY_CARE_PROVIDER_SITE_OTHER): Payer: Medicaid Other

## 2019-02-19 VITALS — BP 138/86 | HR 100 | Temp 98.2°F | Ht 61.0 in | Wt 187.0 lb

## 2019-02-19 DIAGNOSIS — M79641 Pain in right hand: Secondary | ICD-10-CM

## 2019-02-19 DIAGNOSIS — M79642 Pain in left hand: Secondary | ICD-10-CM

## 2019-02-19 DIAGNOSIS — F17210 Nicotine dependence, cigarettes, uncomplicated: Secondary | ICD-10-CM

## 2019-02-19 DIAGNOSIS — M79671 Pain in right foot: Secondary | ICD-10-CM

## 2019-02-19 DIAGNOSIS — M79672 Pain in left foot: Secondary | ICD-10-CM

## 2019-02-19 MED ORDER — PREDNISONE 5 MG (21) PO TBPK: ORAL_TABLET | ORAL | 0 refills | Status: DC

## 2019-02-19 NOTE — Progress Notes (Signed)
[name]:EXHBZJ K January, female DOB:15-Dec-1985, 33 y.o. IRC:[id number]  Chief Complaint  Patient presents with  . Hand Pain    bilateral   . Foot Pain    bilateral     HPI  Bonnie Weaver is a 33 y.o. female who has chronic pain of both hands with morning pain, swelling and stiffness.  She has no trauma. She has been on Naprosyn and in past, ibuprofen.  She has more swelling recently.  She has no redness, no numbness. She had negative EMGs in the past.  Rheumatoid arthritis runs in the family.  She has not had blood work for this.  She has also developed pain in both feet, more on the left that is diffuse.  She has no trauma, no redness. She has some swelling at times.  It hurts more in the early morning and later if she does a lot of walking. She has not changed shoe wear.  She has no numbness, no spasm, no redness.   Body mass index is 35.33 kg/m.  ROS  Review of Systems  Constitutional: Positive for activity change.  Musculoskeletal: Positive for arthralgias, joint swelling and myalgias.  Psychiatric/Behavioral: The patient is nervous/anxious.   All other systems reviewed and are negative.   All other systems reviewed and are negative.  The following is a summary of the past history medically, past history surgically, known current medicines, social history and family history.  This information is gathered electronically by the computer from prior information and documentation.  I review this each visit and have found including this information at this point in the chart is beneficial and informative.    Past Medical History:  Diagnosis Date  . Anxiety   . Bipolar 1 disorder (HCC)   . Kidney stone     Past Surgical History:  Procedure Laterality Date  . CESAREAN SECTION    . LITHOTRIPSY      Family History  Problem Relation Age of Onset  . Diabetes Maternal Grandmother   . Lung cancer Maternal Grandfather   . Hypertension Father   . Other Mother    sepsis  . Asthma Sister     Social History Social History   Tobacco Use  . Smoking status: Current Every Day Smoker    Packs/day: 1.00    Types: Cigarettes  . Smokeless tobacco: Never Used  Substance Use Topics  . Alcohol use: No  . Drug use: No    Allergies  Allergen Reactions  . Sulfa Antibiotics     Current Outpatient Medications  Medication Sig Dispense Refill  . amphetamine-dextroamphetamine (ADDERALL) 20 MG tablet     . ARIPiprazole (ABILIFY) 15 MG tablet     . clonazePAM (KLONOPIN) 0.5 MG tablet Take 0.5 mg by mouth 4 (four) times daily as needed.    . desvenlafaxine (PRISTIQ) 100 MG 24 hr tablet     . FLUoxetine (PROZAC) 20 MG capsule Take 20 mg by mouth daily.    Marland Kitchen gabapentin (NEURONTIN) 100 MG capsule     . ibuprofen (ADVIL,MOTRIN) 800 MG tablet 1 po tid after a meal 21 tablet 0  . lamoTRIgine (LAMICTAL) 100 MG tablet     . lamoTRIgine (LAMICTAL) 200 MG tablet Take 200 mg by mouth daily.    . methocarbamol (ROBAXIN) 500 MG tablet 2 po tid for spasm/pain 30 tablet 0  . naproxen (NAPROSYN) 500 MG tablet Take 1 tablet (500 mg total) by mouth 2 (two) times daily with a meal. 60 tablet 5  No current facility-administered medications for this visit.      Physical Exam  Blood pressure 138/86, pulse 100, temperature 98.2 F (36.8 C), height 5\' 1"  (1.549 m), weight 187 lb (84.8 kg), last menstrual period 02/03/2019.  Constitutional: overall normal hygiene, normal nutrition, well developed, normal grooming, normal body habitus. Assistive device:none  Musculoskeletal: gait and station Limp none, muscle tone and strength are normal, no tremors or atrophy is present.  .  Neurological: coordination overall normal.  Deep tendon reflex/nerve stretch intact.  Sensation normal.  Cranial nerves II-XII intact.   Skin:   Normal overall no scars, lesions, ulcers or rashes. No psoriasis.  Psychiatric: Alert and oriented x 3.  Recent memory intact, remote memory unclear.   Normal mood and affect. Well groomed.  Good eye contact.  Cardiovascular: overall no swelling, no varicosities, no edema bilaterally, normal temperatures of the legs and arms, no clubbing, cyanosis and good capillary refill.  Lymphatic: palpation is normal.  Both hands have some tenderness of the metacarpal phalangeal joints and of the PIP joints with slight swelling.  There is no redness. ROM is full but tender.  NV intact.  The left foot is more tender diffusely than the right foot.  It has very slight swelling.  She has normal gait.  NV intact.  ROM is full bilaterally.  All other systems reviewed and are negative   The patient has been educated about the nature of the problem(s) and counseled on treatment options.  The patient appeared to understand what I have discussed and is in agreement with it.  Encounter Diagnoses  Name Primary?  . Right foot pain Yes  . Left foot pain   . Bilateral hand pain   . Cigarette nicotine dependence without complication    X-rays were done of both feet, reported separately.  I am concerned about rheumatoid arthritis.  I will get blood work done.  I will call in prednisone dose pack.  PLAN Call if any problems.  Precautions discussed.  Continue current medications. (adding prednisone as stated above)  Return to clinic 3 weeks   Get the labs.  Electronically Signed Darreld McleanWayne Clearnce Leja, MD 5/7/202011:03 AM

## 2019-02-19 NOTE — Patient Instructions (Signed)
Steps to Quit Smoking    Smoking tobacco can be bad for your health. It can also affect almost every organ in your body. Smoking puts you and people around you at risk for many serious long-lasting (chronic) diseases. Quitting smoking is hard, but it is one of the best things that you can do for your health. It is never too late to quit.  What are the benefits of quitting smoking?  When you quit smoking, you lower your risk for getting serious diseases and conditions. They can include:  · Lung cancer or lung disease.  · Heart disease.  · Stroke.  · Heart attack.  · Not being able to have children (infertility).  · Weak bones (osteoporosis) and broken bones (fractures).  If you have coughing, wheezing, and shortness of breath, those symptoms may get better when you quit. You may also get sick less often. If you are pregnant, quitting smoking can help to lower your chances of having a baby of low birth weight.  What can I do to help me quit smoking?  Talk with your doctor about what can help you quit smoking. Some things you can do (strategies) include:  · Quitting smoking totally, instead of slowly cutting back how much you smoke over a period of time.  · Going to in-person counseling. You are more likely to quit if you go to many counseling sessions.  · Using resources and support systems, such as:  ? Online chats with a counselor.  ? Phone quitlines.  ? Printed self-help materials.  ? Support groups or group counseling.  ? Text messaging programs.  ? Mobile phone apps or applications.  · Taking medicines. Some of these medicines may have nicotine in them. If you are pregnant or breastfeeding, do not take any medicines to quit smoking unless your doctor says it is okay. Talk with your doctor about counseling or other things that can help you.  Talk with your doctor about using more than one strategy at the same time, such as taking medicines while you are also going to in-person counseling. This can help make  quitting easier.  What things can I do to make it easier to quit?  Quitting smoking might feel very hard at first, but there is a lot that you can do to make it easier. Take these steps:  · Talk to your family and friends. Ask them to support and encourage you.  · Call phone quitlines, reach out to support groups, or work with a counselor.  · Ask people who smoke to not smoke around you.  · Avoid places that make you want (trigger) to smoke, such as:  ? Bars.  ? Parties.  ? Smoke-break areas at work.  · Spend time with people who do not smoke.  · Lower the stress in your life. Stress can make you want to smoke. Try these things to help your stress:  ? Getting regular exercise.  ? Deep-breathing exercises.  ? Yoga.  ? Meditating.  ? Doing a body scan. To do this, close your eyes, focus on one area of your body at a time from head to toe, and notice which parts of your body are tense. Try to relax the muscles in those areas.  · Download or buy apps on your mobile phone or tablet that can help you stick to your quit plan. There are many free apps, such as QuitGuide from the CDC (Centers for Disease Control and Prevention). You can find more   support from smokefree.gov and other websites.  This information is not intended to replace advice given to you by your health care provider. Make sure you discuss any questions you have with your health care provider.  Document Released: 07/28/2009 Document Revised: 05/29/2016 Document Reviewed: 02/15/2015  Elsevier Interactive Patient Education © 2019 [employer]vier Inc.

## 2019-02-20 LAB — CBC WITH DIFFERENTIAL/PLATELET
Absolute Monocytes: 744 cells/uL (ref 200–950)
Basophils Absolute: 49 cells/uL (ref 0–200)
Basophils Relative: 0.4 %
Eosinophils Absolute: 586 cells/uL — ABNORMAL HIGH (ref 15–500)
Eosinophils Relative: 4.8 %
HCT: 37.9 % (ref 35.0–45.0)
Hemoglobin: 13.2 g/dL (ref 11.7–15.5)
Lymphs Abs: 5539 cells/uL — ABNORMAL HIGH (ref 850–3900)
MCH: 30.3 pg (ref 27.0–33.0)
MCHC: 34.8 g/dL (ref 32.0–36.0)
MCV: 86.9 fL (ref 80.0–100.0)
MPV: 11.2 fL (ref 7.5–12.5)
Monocytes Relative: 6.1 %
Neutro Abs: 5283 cells/uL (ref 1500–7800)
Neutrophils Relative %: 43.3 %
Platelets: 258 10*3/uL (ref 140–400)
RBC: 4.36 10*6/uL (ref 3.80–5.10)
RDW: 12.4 % (ref 11.0–15.0)
Total Lymphocyte: 45.4 %
WBC: 12.2 10*3/uL — ABNORMAL HIGH (ref 3.8–10.8)

## 2019-02-20 LAB — ANA: Anti Nuclear Antibody (ANA): NEGATIVE

## 2019-02-20 LAB — RHEUMATOID FACTOR: Rhuematoid fact SerPl-aCnc: <14 IU/mL (ref <14)

## 2019-02-20 LAB — SEDIMENTATION RATE: Sed Rate: 28 mm/h — ABNORMAL HIGH (ref 0–20)

## 2019-03-12 ENCOUNTER — Encounter: Payer: Self-pay | Admitting: Orthopaedic Surgery

## 2019-03-12 ENCOUNTER — Other Ambulatory Visit: Payer: Self-pay

## 2019-03-12 ENCOUNTER — Ambulatory Visit: Payer: Medicaid Other | Admitting: Orthopaedic Surgery

## 2019-03-12 VITALS — BP 103/67 | HR 96 | Temp 97.5°F | Ht 61.0 in | Wt 179.0 lb

## 2019-03-12 DIAGNOSIS — M79641 Pain in right hand: Secondary | ICD-10-CM | POA: Diagnosis not present

## 2019-03-12 DIAGNOSIS — F17210 Nicotine dependence, cigarettes, uncomplicated: Secondary | ICD-10-CM

## 2019-03-12 DIAGNOSIS — M79642 Pain in left hand: Secondary | ICD-10-CM | POA: Diagnosis not present

## 2019-03-12 DIAGNOSIS — M25531 Pain in right wrist: Secondary | ICD-10-CM | POA: Diagnosis not present

## 2019-03-12 DIAGNOSIS — M25532 Pain in left wrist: Secondary | ICD-10-CM

## 2019-03-12 MED ORDER — HYDROCODONE-ACETAMINOPHEN 5-325 MG PO TABS: ORAL_TABLET | ORAL | 0 refills | Status: DC

## 2019-03-12 MED ORDER — DICLOFENAC SODIUM 75 MG PO TBEC: 75.0000 mg | DELAYED_RELEASE_TABLET | Freq: Two times a day (BID) | ORAL | 2 refills | Status: DC

## 2019-03-12 NOTE — Progress Notes (Signed)
Patient ZO:XWRUEA:Bonnie Weaver, female DOB:05/22/1986, 33 y.o. VWU:[id number]  Chief Complaint  Patient presents with  . Hand Pain    Bilat    HPI  Bonnie Weaver is a 33 y.o. female who has hand pain and swelling in the mornings.  Her lab work was negative for RA latex and ANA but she had elevated sed rate and WBC.  She has more swelling in the mornings but no redness.  She has tried ibuprofen and Naprosyn.  I will change to diclofenac.  She is using Aspercreme.  I will have her see rheumatologist but they are not seeing patients in the office yet secondary to COVID-19 .     Body mass index is 33.82 kg/m.  ROS  Review of Systems  Constitutional: Positive for activity change.  Musculoskeletal: Positive for arthralgias, joint swelling and myalgias.  Psychiatric/Behavioral: The patient is nervous/anxious.   All other systems reviewed and are negative.   All other systems reviewed and are negative.  The following is a summary of the past history medically, past history surgically, known current medicines, social history and family history.  This information is gathered electronically by the computer from prior information and documentation.  I review this each visit and have found including this information at this point in the chart is beneficial and informative.    Past Medical History:  Diagnosis Date  . Anxiety   . Bipolar 1 disorder (HCC)   . Kidney stone     Past Surgical History:  Procedure Laterality Date  . CESAREAN SECTION    . LITHOTRIPSY      Family History  Problem Relation Age of Onset  . Diabetes Maternal Grandmother   . Lung cancer Maternal Grandfather   . Hypertension Father   . Other Mother        sepsis  . Asthma Sister     Social History Social History   Tobacco Use  . Smoking status: Current Every Day Smoker    Packs/day: 1.00    Types: Cigarettes  . Smokeless tobacco: Never Used  Substance Use Topics  . Alcohol use: No  . Drug use: No     Allergies  Allergen Reactions  . Sulfa Antibiotics     Current Outpatient Medications  Medication Sig Dispense Refill  . amphetamine-dextroamphetamine (ADDERALL) 20 MG tablet     . ARIPiprazole (ABILIFY) 15 MG tablet     . clonazePAM (KLONOPIN) 0.5 MG tablet Take 0.5 mg by mouth 4 (four) times daily as needed.    . desvenlafaxine (PRISTIQ) 100 MG 24 hr tablet     . diclofenac (VOLTAREN) 75 MG EC tablet Take 1 tablet (75 mg total) by mouth 2 (two) times daily with a meal. 60 tablet 2  . FLUoxetine (PROZAC) 20 MG capsule Take 20 mg by mouth daily.    Marland Kitchen. gabapentin (NEURONTIN) 100 MG capsule     . HYDROcodone-acetaminophen (NORCO/VICODIN) 5-325 MG tablet One tablet every four hours as needed for acute pain.  Limit of five days per [place]. 30 tablet 0  . lamoTRIgine (LAMICTAL) 100 MG tablet     . lamoTRIgine (LAMICTAL) 200 MG tablet Take 200 mg by mouth daily.    . methocarbamol (ROBAXIN) 500 MG tablet 2 po tid for spasm/pain 30 tablet 0  . predniSONE (STERAPRED UNI-PAK 21 TAB) 5 MG (21) TBPK tablet Take 6 pills first day; 5 pills second day; 4 pills third day; 3 pills fourth day; 2 pills next day and 1  pill last day. 21 tablet 0   No current facility-administered medications for this visit.      Physical Exam  Blood pressure 103/67, pulse 96, temperature (!) 97.5 F (36.4 C), height 5\' 1"  (1.549 m), weight 179 lb (81.2 kg).  Constitutional: overall normal hygiene, normal nutrition, well developed, normal grooming, normal body habitus. Assistive device:none  Musculoskeletal: gait and station Limp none, muscle tone and strength are normal, no tremors or atrophy is present.  .  Neurological: coordination overall normal.  Deep tendon reflex/nerve stretch intact.  Sensation normal.  Cranial nerves II-XII intact.   Skin:   Normal overall no scars, lesions, ulcers or rashes. No psoriasis.  Psychiatric: Alert and oriented x 3.  Recent memory intact, remote memory unclear.   Normal mood and affect. Well groomed.  Good eye contact.  Cardiovascular: overall no swelling, no varicosities, no edema bilaterally, normal temperatures of the legs and arms, no clubbing, cyanosis and good capillary refill.  Lymphatic: palpation is normal.  Both hand have swelling of the MCP and PIP joints but no redness.  Grips are decreased.  She has no deformity. She has normal NV status.  All other systems reviewed and are negative   The patient has been educated about the nature of the problem(s) and counseled on treatment options.  The patient appeared to understand what I have discussed and is in agreement with it.  Encounter Diagnoses  Name Primary?  . Bilateral hand pain Yes  . Cigarette nicotine dependence without complication   . Pain in both wrists     PLAN Call if any problems.  Precautions discussed.  Continue current medications and stop Naprosyn and begin diclofenac.  Return to clinic 1 month   Electronically Signed Darreld Mclean, MD 5/28/202010:27 AM

## 2019-04-02 ENCOUNTER — Encounter: Payer: Self-pay | Admitting: Orthopaedic Surgery

## 2019-04-02 ENCOUNTER — Ambulatory Visit: Payer: Medicaid Other | Admitting: Orthopaedic Surgery

## 2019-04-02 ENCOUNTER — Other Ambulatory Visit: Payer: Self-pay

## 2019-04-02 VITALS — BP 104/68 | HR 91 | Temp 98.1°F | Ht 61.0 in | Wt 180.5 lb

## 2019-04-02 DIAGNOSIS — M25531 Pain in right wrist: Secondary | ICD-10-CM

## 2019-04-02 DIAGNOSIS — M79641 Pain in right hand: Secondary | ICD-10-CM

## 2019-04-02 DIAGNOSIS — M79671 Pain in right foot: Secondary | ICD-10-CM | POA: Diagnosis not present

## 2019-04-02 DIAGNOSIS — F17210 Nicotine dependence, cigarettes, uncomplicated: Secondary | ICD-10-CM

## 2019-04-02 DIAGNOSIS — M79672 Pain in left foot: Secondary | ICD-10-CM

## 2019-04-02 DIAGNOSIS — M79642 Pain in left hand: Secondary | ICD-10-CM

## 2019-04-02 DIAGNOSIS — M25532 Pain in left wrist: Secondary | ICD-10-CM

## 2019-04-02 MED ORDER — HYDROCODONE-ACETAMINOPHEN 5-325 MG PO TABS: ORAL_TABLET | ORAL | 0 refills | Status: DC

## 2019-04-02 NOTE — Progress Notes (Signed)
Patient WG:NFAOZH:Bonnie Weaver, female DOB:09-Dec-1985, 33 y.o. YQM:[id number]  Chief Complaint  Patient presents with  . Hand Pain    Bilateral    HPI  Bonnie Weaver is a 33 y.o. female who continues to have pain in both hands and wrists, more in the early morning. She has bilateral foot pain as well. She has no new trauma. We have been unable to get appointment to rheumatology so far.  We will try again.   Body mass index is 34.11 kg/m.  ROS  Review of Systems  Constitutional: Positive for activity change.  Musculoskeletal: Positive for arthralgias, joint swelling and myalgias.  Psychiatric/Behavioral: The patient is nervous/anxious.   All other systems reviewed and are negative.   All other systems reviewed and are negative.  The following is a summary of the past history medically, past history surgically, known current medicines, social history and family history.  This information is gathered electronically by the computer from prior information and documentation.  I review this each visit and have found including this information at this point in the chart is beneficial and informative.    Past Medical History:  Diagnosis Date  . Anxiety   . Bipolar 1 disorder (HCC)   . Kidney stone     Past Surgical History:  Procedure Laterality Date  . CESAREAN SECTION    . LITHOTRIPSY      Family History  Problem Relation Age of Onset  . Diabetes Maternal Grandmother   . Lung cancer Maternal Grandfather   . Hypertension Father   . Other Mother        sepsis  . Asthma Sister     Social History Social History   Tobacco Use  . Smoking status: Current Every Day Smoker    Packs/day: 1.00    Types: Cigarettes  . Smokeless tobacco: Never Used  Substance Use Topics  . Alcohol use: No  . Drug use: No    Allergies  Allergen Reactions  . Sulfa Antibiotics     Current Outpatient Medications  Medication Sig Dispense Refill  . amphetamine-dextroamphetamine  (ADDERALL) 20 MG tablet     . ARIPiprazole (ABILIFY) 15 MG tablet     . clonazePAM (KLONOPIN) 0.5 MG tablet Take 0.5 mg by mouth 4 (four) times daily as needed.    . desvenlafaxine (PRISTIQ) 100 MG 24 hr tablet     . diclofenac (VOLTAREN) 75 MG EC tablet Take 1 tablet (75 mg total) by mouth 2 (two) times daily with a meal. 60 tablet 2  . FLUoxetine (PROZAC) 20 MG capsule Take 20 mg by mouth daily.    Marland Kitchen. gabapentin (NEURONTIN) 100 MG capsule     . HYDROcodone-acetaminophen (NORCO/VICODIN) 5-325 MG tablet One tablet every six hours for pain.  Limit 7 days. 28 tablet 0  . lamoTRIgine (LAMICTAL) 100 MG tablet     . lamoTRIgine (LAMICTAL) 200 MG tablet Take 200 mg by mouth daily.    . methocarbamol (ROBAXIN) 500 MG tablet 2 po tid for spasm/pain 30 tablet 0  . predniSONE (STERAPRED UNI-PAK 21 TAB) 5 MG (21) TBPK tablet Take 6 pills first day; 5 pills second day; 4 pills third day; 3 pills fourth day; 2 pills next day and 1 pill last day. 21 tablet 0   No current facility-administered medications for this visit.      Physical Exam  Blood pressure 104/68, pulse 91, temperature 98.1 F (36.7 C), height 5\' 1"  (1.549 m), weight 180 lb 8 oz (81.9 kg).  Constitutional: overall normal hygiene, normal nutrition, well developed, normal grooming, normal body habitus. Assistive device:none  Musculoskeletal: gait and station Limp none, muscle tone and strength are normal, no tremors or atrophy is present.  .  Neurological: coordination overall normal.  Deep tendon reflex/nerve stretch intact.  Sensation normal.  Cranial nerves II-XII intact.   Skin:   Normal overall no scars, lesions, ulcers or rashes. No psoriasis.  Psychiatric: Alert and oriented x 3.  Recent memory intact, remote memory unclear.  Normal mood and affect. Well groomed.  Good eye contact.  Cardiovascular: overall no swelling, no varicosities, no edema bilaterally, normal temperatures of the legs and arms, no clubbing, cyanosis and good  capillary refill.  Both hands are tender over the MCP joints and some over the PIP joints and slight swelling but no redness.  NV intact.  Wrists are tender but have full motion.  Lymphatic: palpation is normal.  All other systems reviewed and are negative   The patient has been educated about the nature of the problem(s) and counseled on treatment options.  The patient appeared to understand what I have discussed and is in agreement with it.  Encounter Diagnoses  Name Primary?  . Bilateral hand pain Yes  . Cigarette nicotine dependence without complication   . Pain in both wrists   . Right foot pain   . Left foot pain     PLAN Call if any problems.  Precautions discussed.  Continue current medications.   Return to clinic 1 month   I have reviewed the [place] web site prior to prescribing narcotic medicine for this patient.   Electronically Signed Sanjuana Kava, MD 6/18/202010:10 AM

## 2019-04-02 NOTE — Patient Instructions (Signed)

## 2019-04-27 ENCOUNTER — Telehealth: Payer: Self-pay

## 2019-04-27 NOTE — Telephone Encounter (Signed)
Hydrocodone-Acetaminophen  5/325 mg  Qty  28 Tablets  PATIENT USES [facility] IN [place]

## 2019-04-28 MED ORDER — HYDROCODONE-ACETAMINOPHEN 5-325 MG PO TABS: ORAL_TABLET | ORAL | 0 refills | Status: DC

## 2019-04-30 ENCOUNTER — Encounter: Payer: Self-pay | Admitting: Orthopaedic Surgery

## 2019-04-30 ENCOUNTER — Other Ambulatory Visit: Payer: Self-pay

## 2019-04-30 ENCOUNTER — Ambulatory Visit: Payer: Medicaid Other | Admitting: Orthopaedic Surgery

## 2019-04-30 VITALS — BP 122/84 | HR 85 | Temp 97.4°F | Ht 62.0 in | Wt 175.0 lb

## 2019-04-30 DIAGNOSIS — M79641 Pain in right hand: Secondary | ICD-10-CM | POA: Diagnosis not present

## 2019-04-30 DIAGNOSIS — F17210 Nicotine dependence, cigarettes, uncomplicated: Secondary | ICD-10-CM

## 2019-04-30 DIAGNOSIS — M79642 Pain in left hand: Secondary | ICD-10-CM

## 2019-04-30 DIAGNOSIS — M25531 Pain in right wrist: Secondary | ICD-10-CM

## 2019-04-30 DIAGNOSIS — M25532 Pain in left wrist: Secondary | ICD-10-CM

## 2019-04-30 NOTE — Progress Notes (Signed)
[name]:EXBMWU K Trauger, female DOB:July 15, 1986, 33 y.o. [id number]  Chief Complaint  Patient presents with  . Hand Pain    Bilateral hand pain.    HPI  Bonnie Weaver is a 33 y.o. female who has bilateral hand pain and wrist pain and swelling.  She has appointment to rheumatology finally but it is December of this year. She continues to have morning pain.  Nothing seems to help. She has no redness, no numbness, no new trauma.  I have recommended she try Voltaren Gel as it is over the counter now.   Body mass index is 32.01 kg/m.  ROS  Review of Systems  Constitutional: Positive for activity change.  Musculoskeletal: Positive for arthralgias, joint swelling and myalgias.  Psychiatric/Behavioral: The patient is nervous/anxious.   All other systems reviewed and are negative.   All other systems reviewed and are negative.  The following is a summary of the past history medically, past history surgically, known current medicines, social history and family history.  This information is gathered electronically by the computer from prior information and documentation.  I review this each visit and have found including this information at this point in the chart is beneficial and informative.    Past Medical History:  Diagnosis Date  . Anxiety   . Bipolar 1 disorder ([facility])   . Kidney stone     Past Surgical History:  Procedure Laterality Date  . CESAREAN SECTION    . LITHOTRIPSY      Family History  Problem Relation Age of Onset  . Diabetes Maternal Grandmother   . Lung cancer Maternal Grandfather   . Hypertension Father   . Other Mother        sepsis  . Asthma Sister     Social History Social History   Tobacco Use  . Smoking status: Current Every Day Smoker    Packs/day: 1.00    Types: Cigarettes  . Smokeless tobacco: Never Used  Substance Use Topics  . Alcohol use: No  . Drug use: No    Allergies  Allergen Reactions  . Sulfa Antibiotics      Current Outpatient Medications  Medication Sig Dispense Refill  . amphetamine-dextroamphetamine (ADDERALL) 20 MG tablet     . ARIPiprazole (ABILIFY) 15 MG tablet     . clonazePAM (KLONOPIN) 0.5 MG tablet Take 0.5 mg by mouth 4 (four) times daily as needed.    . desvenlafaxine (PRISTIQ) 100 MG 24 hr tablet     . diclofenac (VOLTAREN) 75 MG EC tablet Take 1 tablet (75 mg total) by mouth 2 (two) times daily with a meal. 60 tablet 2  . FLUoxetine (PROZAC) 20 MG capsule Take 20 mg by mouth daily.    Marland Kitchen gabapentin (NEURONTIN) 100 MG capsule     . HYDROcodone-acetaminophen (NORCO/VICODIN) 5-325 MG tablet One tablet every six hours for pain.  Limit 7 days. 25 tablet 0  . lamoTRIgine (LAMICTAL) 100 MG tablet     . lamoTRIgine (LAMICTAL) 200 MG tablet Take 200 mg by mouth daily.    . methocarbamol (ROBAXIN) 500 MG tablet 2 po tid for spasm/pain 30 tablet 0  . predniSONE (STERAPRED UNI-PAK 21 TAB) 5 MG (21) TBPK tablet Take 6 pills first day; 5 pills second day; 4 pills third day; 3 pills fourth day; 2 pills next day and 1 pill last day. 21 tablet 0   No current facility-administered medications for this visit.      Physical Exam  Blood pressure 122/84, pulse  85, height 5\' 2"  (1.575 m), weight 175 lb (79.4 kg).  Constitutional: overall normal hygiene, normal nutrition, well developed, normal grooming, normal body habitus. Assistive device:none  Musculoskeletal: gait and station Limp none, muscle tone and strength are normal, no tremors or atrophy is present.  .  Neurological: coordination overall normal.  Deep tendon reflex/nerve stretch intact.  Sensation normal.  Cranial nerves II-XII intact.   Skin:   Normal overall no scars, lesions, ulcers or rashes. No psoriasis.  Psychiatric: Alert and oriented x 3.  Recent memory intact, remote memory unclear.  Normal mood and affect. Well groomed.  Good eye contact.  Cardiovascular: overall no swelling, no varicosities, no edema bilaterally,  normal temperatures of the legs and arms, no clubbing, cyanosis and good capillary refill.  Both hands are tender as are the wrists.  She has very slight swelling of the fingers but full motion.  NV intact.  She has no redness.  Lymphatic: palpation is normal.  All other systems reviewed and are negative   The patient has been educated about the nature of the problem(s) and counseled on treatment options.  The patient appeared to understand what I have discussed and is in agreement with it.  Encounter Diagnoses  Name Primary?  . Bilateral hand pain Yes  . Cigarette nicotine dependence without complication   . Pain in both wrists   . Pain in both hands     PLAN Call if any problems.  Precautions discussed.  Continue current medications.   Return to clinic 3 months   Electronically Signed Darreld McleanWayne Rhandi Despain, MD 7/16/20209:55 AM

## 2019-05-14 ENCOUNTER — Telehealth: Payer: Self-pay | Admitting: Orthopedic Surgery

## 2019-05-14 NOTE — Telephone Encounter (Signed)
Patient called to request.

## 2019-05-26 NOTE — Telephone Encounter (Signed)
(  continued)  patient requests refill: HYDROcodone-acetaminophen (NORCO/VICODIN) 5-325 MG tablet 25 tablet   -[facility], [place], [place]

## 2019-05-26 NOTE — Telephone Encounter (Signed)
No more narcotics.  She can contact her family doctor if desired.

## 2019-05-27 NOTE — Telephone Encounter (Signed)
Called patient to notify - left message to return call. 

## 2019-05-28 ENCOUNTER — Telehealth: Payer: Self-pay

## 2019-05-28 NOTE — Telephone Encounter (Signed)
Patient called to ask about her refill request. I read to her what Dr. Luna Glasgow had replied to [facility]. Patient stated ok.

## 2019-05-28 NOTE — Telephone Encounter (Signed)
No return call from patient.

## 2019-07-30 ENCOUNTER — Ambulatory Visit: Payer: Medicaid Other | Admitting: Orthopaedic Surgery

## 2019-08-04 ENCOUNTER — Encounter: Payer: Self-pay | Admitting: Orthopaedic Surgery

## 2019-08-04 ENCOUNTER — Ambulatory Visit (INDEPENDENT_AMBULATORY_CARE_PROVIDER_SITE_OTHER): Payer: Medicaid Other | Admitting: Orthopaedic Surgery

## 2019-08-04 ENCOUNTER — Other Ambulatory Visit: Payer: Self-pay

## 2019-08-04 VITALS — BP 114/76 | HR 78 | Ht 62.0 in | Wt 172.0 lb

## 2019-08-04 DIAGNOSIS — M79642 Pain in left hand: Secondary | ICD-10-CM | POA: Diagnosis not present

## 2019-08-04 DIAGNOSIS — M25531 Pain in right wrist: Secondary | ICD-10-CM

## 2019-08-04 DIAGNOSIS — M79641 Pain in right hand: Secondary | ICD-10-CM

## 2019-08-04 DIAGNOSIS — F17210 Nicotine dependence, cigarettes, uncomplicated: Secondary | ICD-10-CM | POA: Diagnosis not present

## 2019-08-04 DIAGNOSIS — M25532 Pain in left wrist: Secondary | ICD-10-CM

## 2019-08-04 NOTE — Progress Notes (Signed)
Patient OX:BDZHGD Bonnie Weaver, female DOB:1986/08/01, 33 y.o. JME:[id number]  Chief Complaint  Patient presents with  . Hand Pain    bilateral   . Knee Pain    bilateral     HPI  Bonnie Weaver is a 33 y.o. female who has morning hand and wrist pain and swelling and now having some morning foot pain. She is scheduled to see rheumatologist in December.  She is taking her diclofenac and using warm water on her hands in the morning. She is not getting any better.  She has no trauma.  There is a strong family history of arthritis.   Body mass index is 31.46 kg/m.  ROS  Review of Systems  Constitutional: Positive for activity change.  Musculoskeletal: Positive for arthralgias, joint swelling and myalgias.  Psychiatric/Behavioral: The patient is nervous/anxious.   All other systems reviewed and are negative.   All other systems reviewed and are negative.  The following is a summary of the past history medically, past history surgically, known current medicines, social history and family history.  This information is gathered electronically by the computer from prior information and documentation.  I review this each visit and have found including this information at this point in the chart is beneficial and informative.    Past Medical History:  Diagnosis Date  . Anxiety   . Bipolar 1 disorder ([place])   . Kidney stone     Past Surgical History:  Procedure Laterality Date  . CESAREAN SECTION    . LITHOTRIPSY      Family History  Problem Relation Age of Onset  . Rheum arthritis Paternal Grandmother   . Diabetes Maternal Grandmother   . Lung cancer Maternal Grandfather   . Hypertension Father   . Autoimmune disease Father   . Other Mother        sepsis  . Asthma Sister     Social History Social History   Tobacco Use  . Smoking status: Current Every Day Smoker    Packs/day: 1.00    Types: Cigarettes  . Smokeless tobacco: Never Used  Substance Use Topics  . Alcohol  use: No  . Drug use: No    Allergies  Allergen Reactions  . Sulfa Antibiotics     Current Outpatient Medications  Medication Sig Dispense Refill  . amphetamine-dextroamphetamine (ADDERALL) 20 MG tablet     . ARIPiprazole (ABILIFY) 15 MG tablet     . clonazePAM (KLONOPIN) 0.5 MG tablet Take 0.5 mg by mouth 4 (four) times daily as needed.    . desvenlafaxine (PRISTIQ) 100 MG 24 hr tablet     . diclofenac (VOLTAREN) 75 MG EC tablet Take 1 tablet (75 mg total) by mouth 2 (two) times daily with a meal. 60 tablet 2  . FLUoxetine (PROZAC) 20 MG capsule Take 20 mg by mouth daily.    Marland Kitchen gabapentin (NEURONTIN) 100 MG capsule     . HYDROcodone-acetaminophen (NORCO/VICODIN) 5-325 MG tablet One tablet every six hours for pain.  Limit 7 days. 25 tablet 0  . lamoTRIgine (LAMICTAL) 100 MG tablet     . lamoTRIgine (LAMICTAL) 200 MG tablet Take 200 mg by mouth daily.    . methocarbamol (ROBAXIN) 500 MG tablet 2 po tid for spasm/pain 30 tablet 0  . predniSONE (STERAPRED UNI-PAK 21 TAB) 5 MG (21) TBPK tablet Take 6 pills first day; 5 pills second day; 4 pills third day; 3 pills fourth day; 2 pills next day and 1 pill last day. 21 tablet  0  . acyclovir (ZOVIRAX) 800 MG tablet     . albuterol (VENTOLIN HFA) 108 (90 Base) MCG/ACT inhaler      No current facility-administered medications for this visit.      Physical Exam  Blood pressure 114/76, pulse 78, height 5\' 2"  (1.575 m), weight 172 lb (78 kg).  Constitutional: overall normal hygiene, normal nutrition, well developed, normal grooming, normal body habitus. Assistive device:none  Musculoskeletal: gait and station Limp none, muscle tone and strength are normal, no tremors or atrophy is present.  .  Neurological: coordination overall normal.  Deep tendon reflex/nerve stretch intact.  Sensation normal.  Cranial nerves II-XII intact.   Skin:   Normal overall no scars, lesions, ulcers or rashes. No psoriasis.  Psychiatric: Alert and oriented x 3.   Recent memory intact, remote memory unclear.  Normal mood and affect. Well groomed.  Good eye contact.  Cardiovascular: overall no swelling, no varicosities, no edema bilaterally, normal temperatures of the legs and arms, no clubbing, cyanosis and good capillary refill.  Lymphatic: palpation is normal.  All other systems reviewed and are negative   The patient has been educated about the nature of the problem(s) and counseled on treatment options.  The patient appeared to understand what I have discussed and is in agreement with it.  Encounter Diagnoses  Name Primary?  . Bilateral hand pain Yes  . Cigarette nicotine dependence without complication   . Pain in both wrists     PLAN Call if any problems.  Precautions discussed.  Continue current medications.   Return to clinic 3 months   To see rheumatologist.  Electronically Signed , MD 10/20/202011:46 AM

## 2019-08-04 NOTE — Patient Instructions (Signed)
Steps to Quit Smoking Smoking tobacco is the leading cause of preventable death. It can affect almost every organ in the body. Smoking puts you and people around you at risk for many serious, long-lasting (chronic) diseases. Quitting smoking can be hard, but it is one of the best things that you can do for your health. It is never too late to quit. How do I get ready to quit? When you decide to quit smoking, make a plan to help you succeed. Before you quit:  Pick a date to quit. Set a date within the next 2 weeks to give you time to prepare.  Write down the reasons why you are quitting. Keep this list in places where you will see it often.  Tell your family, friends, and co-workers that you are quitting. Their support is important.  Talk with your doctor about the choices that may help you quit.  Find out if your health insurance will pay for these treatments.  Know the people, places, things, and activities that make you want to smoke (triggers). Avoid them. What first steps can I take to quit smoking?  Throw away all cigarettes at home, at work, and in your car.  Throw away the things that you use when you smoke, such as ashtrays and lighters.  Clean your car. Make sure to empty the ashtray.  Clean your home, including curtains and carpets. What can I do to help me quit smoking? Talk with your doctor about taking medicines and seeing a counselor at the same time. You are more likely to succeed when you do both.  If you are pregnant or breastfeeding, talk with your doctor about counseling or other ways to quit smoking. Do not take medicine to help you quit smoking unless your doctor tells you to do so. To quit smoking: Quit right away  Quit smoking totally, instead of slowly cutting back on how much you smoke over a period of time.  Go to counseling. You are more likely to quit if you go to counseling sessions regularly. Take medicine You may take medicines to help you quit. Some  medicines need a prescription, and some you can buy over-the-counter. Some medicines may contain a drug called nicotine to replace the nicotine in cigarettes. Medicines may:  Help you to stop having the desire to smoke (cravings).  Help to stop the problems that come when you stop smoking (withdrawal symptoms). Your doctor may ask you to use:  Nicotine patches, gum, or lozenges.  Nicotine inhalers or sprays.  Non-nicotine medicine that is taken by mouth. Find resources Find resources and other ways to help you quit smoking and remain smoke-free after you quit. These resources are most helpful when you use them often. They include:  Online chats with a counselor.  Phone quitlines.  Printed self-help materials.  Support groups or group counseling.  Text messaging programs.  Mobile phone apps. Use apps on your mobile phone or tablet that can help you stick to your quit plan. There are many free apps for mobile phones and tablets as well as websites. Examples include Quit Guide from the CDC and smokefree.gov  What things can I do to make it easier to quit?   Talk to your family and friends. Ask them to support and encourage you.  Call a phone quitline (1-800-QUIT-NOW), reach out to support groups, or work with a counselor.  Ask people who smoke to not smoke around you.  Avoid places that make you want to smoke,   such as: ? Bars. ? Parties. ? Smoke-break areas at work.  Spend time with people who do not smoke.  Lower the stress in your life. Stress can make you want to smoke. Try these things to help your stress: ? Getting regular exercise. ? Doing deep-breathing exercises. ? Doing yoga. ? Meditating. ? Doing a body scan. To do this, close your eyes, focus on one area of your body at a time from head to toe. Notice which parts of your body are tense. Try to relax the muscles in those areas. How will I feel when I quit smoking? Day 1 to 3 weeks Within the first 24 hours,  you may start to have some problems that come from quitting tobacco. These problems are very bad 2-3 days after you quit, but they do not often last for more than 2-3 weeks. You may get these symptoms:  Mood swings.  Feeling restless, nervous, angry, or annoyed.  Trouble concentrating.  Dizziness.  Strong desire for high-sugar foods and nicotine.  Weight gain.  Trouble pooping (constipation).  Feeling like you may vomit (nausea).  Coughing or a sore throat.  Changes in how the medicines that you take for other issues work in your body.  Depression.  Trouble sleeping (insomnia). Week 3 and afterward After the first 2-3 weeks of quitting, you may start to notice more positive results, such as:  Better sense of smell and taste.  Less coughing and sore throat.  Slower heart rate.  Lower blood pressure.  Clearer skin.  Better breathing.  Fewer sick days. Quitting smoking can be hard. Do not give up if you fail the first time. Some people need to try a few times before they succeed. Do your best to stick to your quit plan, and talk with your doctor if you have any questions or concerns. Summary  Smoking tobacco is the leading cause of preventable death. Quitting smoking can be hard, but it is one of the best things that you can do for your health.  When you decide to quit smoking, make a plan to help you succeed.  Quit smoking right away, not slowly over a period of time.  When you start quitting, seek help from your doctor, family, or friends. This information is not intended to replace advice given to you by your health care provider. Make sure you discuss any questions you have with your health care provider. Document Released: 07/28/2009 Document Revised: 12/19/2018 Document Reviewed: 12/20/2018 Elsevier Patient Education  2020 [employer]  

## 2019-08-10 ENCOUNTER — Other Ambulatory Visit: Payer: Self-pay | Admitting: Orthopaedic Surgery

## 2019-09-21 ENCOUNTER — Encounter: Payer: Self-pay | Admitting: Rheumatology

## 2019-09-21 ENCOUNTER — Telehealth: Payer: Self-pay | Admitting: Rheumatology

## 2019-09-21 ENCOUNTER — Telehealth (INDEPENDENT_AMBULATORY_CARE_PROVIDER_SITE_OTHER): Payer: Medicaid Other | Admitting: Rheumatology

## 2019-09-21 ENCOUNTER — Other Ambulatory Visit: Payer: Self-pay

## 2019-09-21 DIAGNOSIS — Z8659 Personal history of other mental and behavioral disorders: Secondary | ICD-10-CM | POA: Insufficient documentation

## 2019-09-21 DIAGNOSIS — A6000 Herpesviral infection of urogenital system, unspecified: Secondary | ICD-10-CM

## 2019-09-21 DIAGNOSIS — R5383 Other fatigue: Secondary | ICD-10-CM

## 2019-09-21 DIAGNOSIS — M79671 Pain in right foot: Secondary | ICD-10-CM

## 2019-09-21 DIAGNOSIS — M25562 Pain in left knee: Secondary | ICD-10-CM

## 2019-09-21 DIAGNOSIS — M79641 Pain in right hand: Secondary | ICD-10-CM

## 2019-09-21 DIAGNOSIS — Z8709 Personal history of other diseases of the respiratory system: Secondary | ICD-10-CM

## 2019-09-21 DIAGNOSIS — M25531 Pain in right wrist: Secondary | ICD-10-CM | POA: Diagnosis not present

## 2019-09-21 DIAGNOSIS — F329 Major depressive disorder, single episode, unspecified: Secondary | ICD-10-CM

## 2019-09-21 DIAGNOSIS — F419 Anxiety disorder, unspecified: Secondary | ICD-10-CM | POA: Insufficient documentation

## 2019-09-21 DIAGNOSIS — M25532 Pain in left wrist: Secondary | ICD-10-CM

## 2019-09-21 DIAGNOSIS — M25561 Pain in right knee: Secondary | ICD-10-CM | POA: Diagnosis not present

## 2019-09-21 DIAGNOSIS — F32A Depression, unspecified: Secondary | ICD-10-CM

## 2019-09-21 DIAGNOSIS — Z79899 Other long term (current) drug therapy: Secondary | ICD-10-CM

## 2019-09-21 DIAGNOSIS — I7300 Raynaud's syndrome without gangrene: Secondary | ICD-10-CM

## 2019-09-21 DIAGNOSIS — M79642 Pain in left hand: Secondary | ICD-10-CM

## 2019-09-21 DIAGNOSIS — G8929 Other chronic pain: Secondary | ICD-10-CM

## 2019-09-21 DIAGNOSIS — M79672 Pain in left foot: Secondary | ICD-10-CM

## 2019-09-21 NOTE — Progress Notes (Signed)
Virtual Visit via Video Note  I connected with Bonnie Weaver on 09/21/19 at  9:45 AM EST by a video enabled telemedicine application and verified that I am speaking with the correct person using two identifiers.  Location: Patient: Home  Provider: Clinic  This service was conducted via virtual visit.  Both audio and visual tools were used.  The patient was located at home. I was located in my office.  Consent was obtained prior to the virtual visit and is aware of possible charges through their insurance for this visit.  The patient is an established patient.  Dr. Estanislado Pandy, MD conducted the virtual visit and Hazel Sams, PA-C acted as scribe during the service.  Office staff helped with scheduling follow up visits after the service was conducted.   I discussed the limitations of evaluation and management by telemedicine and the availability of in person appointments. The patient expressed understanding and agreed to proceed.  CC: History of Present Illness: Patient is a 33 year old female with history of joint pain and joint swelling.  She has been seen in consultation per request of her PCP.  According to patient her symptoms started about almost a year ago with pain and stiffness in her hands and swelling.  She states it got to the point that she would started having difficulty lifting objects.  She also had been having pain and swelling in her feet.  She states she has discomfort in her shoulders hips and knees as well.  She has chronic discomfort in her neck.  She was seen by an orthopedic surgeon in May who did blood work which was normal.  Patient states she had some swelling that she was placed on prednisone taper.  She had good response to the prednisone taper.  She has been taking Voltaren tablets since then.  Which helps her to some extent.  She reports swelling in her hands and feet.  She also gives history of autoimmune disease in her mother and paternal grandmother.  Review of Systems   Constitutional: Positive for malaise/fatigue. Negative for fever.  HENT:       Denies oral ulcerations Reports recurrent nasal ulcerations  Denies dry mouth  Eyes: Negative for photophobia, pain, discharge and redness.       +Dry eyes  Respiratory: Negative for cough, shortness of breath and wheezing.   Cardiovascular: Negative for chest pain and palpitations.  Gastrointestinal: Negative for blood in stool, constipation and diarrhea.  Genitourinary: Negative for dysuria.  Musculoskeletal: Positive for joint pain. Negative for back pain, myalgias and neck pain.       +Morning stiffness   Skin: Positive for rash.       Raynaud's  Neurological: Negative for dizziness and headaches.  Psychiatric/Behavioral: Positive for depression. The patient is nervous/anxious and has insomnia.       Observations/Objective: Physical Exam  Constitutional: She is oriented to person, place, and time and well-developed, well-nourished, and in no distress.  HENT:  Head: Normocephalic and atraumatic.  Eyes: Conjunctivae are normal.  Pulmonary/Chest: Effort normal.  Neurological: She is alert and oriented to person, place, and time.  Psychiatric: Mood, memory, affect and judgment normal.    Patient reports morning stiffness for 2  hours.   Patient reports nocturnal pain.  Difficulty dressing/grooming: Denies Difficulty climbing stairs: Reports Difficulty getting out of chair: Denies Difficulty using hands for taps, buttons, cutlery, and/or writing: Reports  Assessment and Plan: Diagnoses and all orders for this visit:  Bilateral hand pain-  Patient complains of pain in bilateral hands with swelling.  She states she has difficulty holding objects.  Her autoimmune work-up in the past was negative.  I will obtain following labs today.  I will schedule an office visit after the lab results are available.  I plan to obtain x-ray of her hands. Comments: 02/19/19: ESR 28, ANA negative, RF<14 Orders: -      RF -     CCP -     14-3-3 eta Protein  Pain in both wrists  Pain in both feet-patient gives history of pain and swelling in her bilateral feet.  She states she has difficulty walking due to discomfort in her feet.  I plan to obtain x-ray of her bilateral feet at the follow-up visit.  Chronic pain of both knees-she complains of pain in her bilateral knee joints.  She states she has difficulty climbing stairs.  She has been taking Voltaren tablet every day.   High risk medication use-in anticipation to treat her with immunosuppressive therapies I will be obtaining following labs today. -     CBC with diff -     CMP with GFR -     G6PD -     Hepatitis B core antibody, IgM -     Hepatitis B surface antigen -     Hepatitis C antibody -     HIV antibody -     QuantiFERON-TB Gold Plus -     Serum protein electrophoresis with reflex -     Immunoglobulins  Other fatigue-she has been experiencing a lot of fatigue.  I will obtain following labs today. -     UA w reflex microscopic -     ESR -     CK (Creatine Kinase) -     TSH  Raynaud's phenomenon without gangrene-she complains of Raynaud's phenomenon.  She also gives history of positive lupus in her mother and paternal grandmother.  I will obtain following panel. -     ANA -     Scl-70 -     RNP Antibody -     Anti-Smith antibody -     Ro -     La -     Ds DNA -     C3 and C4 -     Beta-2 GP1 -     acl -     Lupus Anticoagulant Eval w/Reflex  History of asthma  Anxiety and depression  History of ADHD  Genital herpes simplex, unspecified site    Follow Up Instructions: She will follow up on   I discussed the assessment and treatment plan with the patient. The patient was provided an opportunity to ask questions and all were answered. The patient agreed with the plan and demonstrated an understanding of the instructions.   The patient was advised to call back or seek an in-person evaluation if the symptoms worsen or  if the condition fails to improve as anticipated.  I provided 45 minutes of non-face-to-face time during this encounter.   Bo Merino, MD

## 2019-09-21 NOTE — Telephone Encounter (Signed)
Labs were released this morning during patient's virtual visit.

## 2019-09-21 NOTE — Telephone Encounter (Signed)
Patient going to [facility] in [place] for labs. Please release orders.  

## 2019-09-22 NOTE — Progress Notes (Signed)
I called patient regarding abnormal UA.  Patient states she was on her periods.

## 2019-09-25 LAB — COMPLETE METABOLIC PANEL WITH GFR
AG Ratio: 1.4 (calc) (ref 1.0–2.5)
Albumin: 3.9 g/dL (ref 3.6–5.1)
Glucose, Bld: 85 mg/dL (ref 65–139)
Total Protein: 6.6 g/dL (ref 6.1–8.1)

## 2019-09-25 LAB — URINALYSIS, ROUTINE W REFLEX MICROSCOPIC
Bilirubin Urine: NEGATIVE
Nitrite: NEGATIVE
Specific Gravity, Urine: 1.022 (ref 1.001–1.03)

## 2019-09-25 LAB — CBC WITH DIFFERENTIAL/PLATELET
Basophils Relative: 0.6 %
Eosinophils Relative: 3.5 %
Hemoglobin: 13.6 g/dL (ref 11.7–15.5)
MPV: 10.5 fL (ref 7.5–12.5)
Neutrophils Relative %: 49.5 %
RBC: 4.53 10*6/uL (ref 3.80–5.10)

## 2019-09-29 LAB — PROTEIN ELECTROPHORESIS, SERUM, WITH REFLEX
Albumin ELP: 3.9 g/dL (ref 3.8–4.8)
Alpha 1: 0.3 g/dL (ref 0.2–0.3)
Alpha 2: 0.8 g/dL (ref 0.5–0.9)
Beta 2: 0.4 g/dL (ref 0.2–0.5)
Beta Globulin: 0.4 g/dL (ref 0.4–0.6)
Gamma Globulin: 1 g/dL (ref 0.8–1.7)
Total Protein: 6.8 g/dL (ref 6.1–8.1)

## 2019-09-29 LAB — CBC WITH DIFFERENTIAL/PLATELET
Absolute Monocytes: 610 cells/uL (ref 200–950)
Basophils Absolute: 64 cells/uL (ref 0–200)
Eosinophils Absolute: 375 cells/uL (ref 15–500)
HCT: 40.1 % (ref 35.0–45.0)
Lymphs Abs: 4355 cells/uL — ABNORMAL HIGH (ref 850–3900)
MCH: 30 pg (ref 27.0–33.0)
MCHC: 33.9 g/dL (ref 32.0–36.0)
MCV: 88.5 fL (ref 80.0–100.0)
Monocytes Relative: 5.7 %
Neutro Abs: 5297 cells/uL (ref 1500–7800)
Platelets: 303 10*3/uL (ref 140–400)
RDW: 13.2 % (ref 11.0–15.0)
Total Lymphocyte: 40.7 %
WBC: 10.7 10*3/uL (ref 3.8–10.8)

## 2019-09-29 LAB — URINALYSIS, ROUTINE W REFLEX MICROSCOPIC
Glucose, UA: NEGATIVE
Ketones, ur: NEGATIVE
pH: 6 (ref 5.0–8.0)

## 2019-09-29 LAB — LUPUS ANTICOAGULANT EVAL W/ REFLEX
PTT-LA Screen: 34 s (ref <=40)
dRVVT: 49 s — ABNORMAL HIGH (ref <=45)

## 2019-09-29 LAB — TSH: TSH: 1.05 mIU/L

## 2019-09-29 LAB — HCV RNA,QUANTITATIVE REAL TIME PCR
HCV Quantitative Log: <1.18 Log IU/mL
HCV RNA, PCR, QN: <15 IU/mL

## 2019-09-29 LAB — 14-3-3 ETA PROTEIN: 14-3-3 eta Protein: <0.2 ng/mL (ref <0.2)

## 2019-09-29 LAB — CYCLIC CITRUL PEPTIDE ANTIBODY, IGG: Cyclic Citrullin Peptide Ab: <16 UNITS

## 2019-09-29 LAB — COMPLETE METABOLIC PANEL WITH GFR
ALT: 7 U/L (ref 6–29)
AST: 11 U/L (ref 10–30)
Alkaline phosphatase (APISO): 86 U/L (ref 31–125)
BUN: 12 mg/dL (ref 7–25)
CO2: 23 mmol/L (ref 20–32)
Calcium: 8.9 mg/dL (ref 8.6–10.2)
Chloride: 105 mmol/L (ref 98–110)
Creat: 0.62 mg/dL (ref 0.50–1.10)
GFR, Est African American: 137 mL/min/{1.73_m2} (ref >=60)
GFR, Est Non African American: 118 mL/min/{1.73_m2} (ref >=60)
Globulin: 2.7 g/dL (calc) (ref 1.9–3.7)
Potassium: 4.1 mmol/L (ref 3.5–5.3)
Sodium: 141 mmol/L (ref 135–146)
Total Bilirubin: 0.3 mg/dL (ref 0.2–1.2)

## 2019-09-29 LAB — HIV ANTIBODY (ROUTINE TESTING W REFLEX): HIV 1&2 Ab, 4th Generation: NONREACTIVE

## 2019-09-29 LAB — SEDIMENTATION RATE: Sed Rate: 29 mm/h — ABNORMAL HIGH (ref 0–20)

## 2019-09-29 LAB — QUANTIFERON-TB GOLD PLUS
Mitogen-NIL: >10 IU/mL
NIL: 0.03 IU/mL
QuantiFERON-TB Gold Plus: NEGATIVE
TB1-NIL: <0 IU/mL
TB2-NIL: <0 IU/mL

## 2019-09-29 LAB — BETA-2 GLYCOPROTEIN ANTIBODIES
Beta-2 Glyco 1 IgA: <9 SAU (ref <=20)
Beta-2 Glyco 1 IgM: 9 SMU (ref <=20)
Beta-2 Glyco I IgG: <9 SGU (ref <=20)

## 2019-09-29 LAB — IGG, IGA, IGM
IgG (Immunoglobin G), Serum: 1092 mg/dL (ref 600–1640)
IgM, Serum: 93 mg/dL (ref 50–300)
Immunoglobulin A: 216 mg/dL (ref 47–310)

## 2019-09-29 LAB — GLUCOSE 6 PHOSPHATE DEHYDROGENASE: G-6PDH: 15.9 U/g Hgb (ref 7.0–20.5)

## 2019-09-29 LAB — HEPATITIS B SURFACE ANTIGEN: Hepatitis B Surface Ag: NONREACTIVE

## 2019-09-29 LAB — CARDIOLIPIN ANTIBODIES, IGG, IGM, IGA
Anticardiolipin IgA: <11 [APL'U]
Anticardiolipin IgG: <14 [GPL'U]
Anticardiolipin IgM: <12 [MPL'U]

## 2019-09-29 LAB — ANTI-SMITH ANTIBODY: ENA SM Ab Ser-aCnc: <1 AI

## 2019-09-29 LAB — HEPATITIS B CORE ANTIBODY, IGM: Hep B C IgM: NONREACTIVE

## 2019-09-29 LAB — SJOGRENS SYNDROME-A EXTRACTABLE NUCLEAR ANTIBODY: SSA (Ro) (ENA) Antibody, IgG: <1 AI

## 2019-09-29 LAB — ANTI-DNA ANTIBODY, DOUBLE-STRANDED: ds DNA Ab: 1 IU/mL

## 2019-09-29 LAB — SJOGRENS SYNDROME-B EXTRACTABLE NUCLEAR ANTIBODY: SSB (La) (ENA) Antibody, IgG: <1 AI

## 2019-09-29 LAB — RNP ANTIBODY: Ribonucleic Protein(ENA) Antibody, IgG: <1 AI

## 2019-09-29 LAB — HEPATITIS C ANTIBODY
Hepatitis C Ab: REACTIVE — AB
SIGNAL TO CUT-OFF: 15.5 — ABNORMAL HIGH (ref <1.00)

## 2019-09-29 LAB — CK: Total CK: 58 U/L (ref 29–143)

## 2019-09-29 LAB — RFLX DRVVT CONFRIM: DRVVT CONFIRM: POSITIVE — AB

## 2019-09-29 LAB — RFX DRVVT 1:1 MIX

## 2019-09-29 LAB — C3 AND C4
C3 Complement: 130 mg/dL (ref 83–193)
C4 Complement: 16 mg/dL (ref 15–57)

## 2019-09-29 LAB — RHEUMATOID FACTOR: Rheumatoid fact SerPl-aCnc: <14 IU/mL (ref <14)

## 2019-09-29 LAB — ANA: Anti Nuclear Antibody (ANA): NEGATIVE

## 2019-09-29 LAB — ANTI-SCLERODERMA ANTIBODY: Scleroderma (Scl-70) (ENA) Antibody, IgG: <1 AI

## 2019-09-29 NOTE — Progress Notes (Signed)
I will discuss results at the follow-up visit.

## 2019-10-02 NOTE — Progress Notes (Deleted)
Virtual Visit via Video Note  I connected with Bonnie Weaver on 10/02/19 at  1:30 PM EST by a video enabled telemedicine application and verified that I am speaking with the correct person using two identifiers.  Location: Patient: *** Provider: ***   I discussed the limitations of evaluation and management by telemedicine and the availability of in person appointments. The patient expressed understanding and agreed to proceed.  CC: History of Present Illness: Patient is a 33 year old female with a past medical history of   Review of Systems  Constitutional: Negative for fever and malaise/fatigue.  Eyes: Negative for photophobia, pain, discharge and redness.  Respiratory: Negative for cough, shortness of breath and wheezing.   Cardiovascular: Negative for chest pain and palpitations.  Gastrointestinal: Negative for blood in stool, constipation and diarrhea.  Genitourinary: Negative for dysuria.  Musculoskeletal: Negative for back pain, joint pain, myalgias and neck pain.  Skin: Negative for rash.  Neurological: Negative for dizziness and headaches.  Psychiatric/Behavioral: Negative for depression. The patient is not nervous/anxious and does not have insomnia.       Observations/Objective: Physical Exam  Constitutional: She is oriented to person, place, and time and well-developed, well-nourished, and in no distress.  HENT:  Head: Normocephalic and atraumatic.  Eyes: Conjunctivae are normal.  Pulmonary/Chest: Effort normal.  Neurological: She is alert and oriented to person, place, and time.  Psychiatric: Mood, memory, affect and judgment normal.   Patient reports morning stiffness for  {minute/hour:19697}.   Patient {Actions; denies-reports:120008} nocturnal pain.  Difficulty dressing/grooming: {ACTIONS;DENIES/REPORTS:21021675::"Denies"} Difficulty climbing stairs: {ACTIONS;DENIES/REPORTS:21021675::"Denies"} Difficulty getting out of chair:  {ACTIONS;DENIES/REPORTS:21021675::"Denies"} Difficulty using hands for taps, buttons, cutlery, and/or writing: {ACTIONS;DENIES/REPORTS:21021675::"Denies"}   Assessment and Plan:   Follow Up Instructions: She will follow up in    I discussed the assessment and treatment plan with the patient. The patient was provided an opportunity to ask questions and all were answered. The patient agreed with the plan and demonstrated an understanding of the instructions.   The patient was advised to call back or seek an in-person evaluation if the symptoms worsen or if the condition fails to improve as anticipated.  I provided *** minutes of non-face-to-face time during this encounter.   Ofilia Neas, PA-C

## 2019-10-06 ENCOUNTER — Telehealth: Payer: Medicaid Other | Admitting: Rheumatology

## 2019-10-26 ENCOUNTER — Ambulatory Visit (INDEPENDENT_AMBULATORY_CARE_PROVIDER_SITE_OTHER): Payer: Medicaid Other

## 2019-10-26 ENCOUNTER — Ambulatory Visit: Payer: Medicaid Other | Admitting: Rheumatology

## 2019-10-26 ENCOUNTER — Other Ambulatory Visit: Payer: Self-pay

## 2019-10-26 ENCOUNTER — Ambulatory Visit: Payer: Self-pay

## 2019-10-26 ENCOUNTER — Encounter: Payer: Self-pay | Admitting: Rheumatology

## 2019-10-26 VITALS — BP 116/75 | HR 90 | Resp 15 | Ht 62.0 in | Wt 177.0 lb

## 2019-10-26 DIAGNOSIS — M79671 Pain in right foot: Secondary | ICD-10-CM

## 2019-10-26 DIAGNOSIS — F419 Anxiety disorder, unspecified: Secondary | ICD-10-CM

## 2019-10-26 DIAGNOSIS — M25562 Pain in left knee: Secondary | ICD-10-CM | POA: Diagnosis not present

## 2019-10-26 DIAGNOSIS — M79642 Pain in left hand: Secondary | ICD-10-CM | POA: Diagnosis not present

## 2019-10-26 DIAGNOSIS — M25531 Pain in right wrist: Secondary | ICD-10-CM

## 2019-10-26 DIAGNOSIS — M79641 Pain in right hand: Secondary | ICD-10-CM

## 2019-10-26 DIAGNOSIS — G8929 Other chronic pain: Secondary | ICD-10-CM

## 2019-10-26 DIAGNOSIS — I7300 Raynaud's syndrome without gangrene: Secondary | ICD-10-CM

## 2019-10-26 DIAGNOSIS — M79672 Pain in left foot: Secondary | ICD-10-CM

## 2019-10-26 DIAGNOSIS — M3500 Sicca syndrome, unspecified: Secondary | ICD-10-CM

## 2019-10-26 DIAGNOSIS — M25532 Pain in left wrist: Secondary | ICD-10-CM

## 2019-10-26 DIAGNOSIS — F32A Depression, unspecified: Secondary | ICD-10-CM

## 2019-10-26 DIAGNOSIS — R5383 Other fatigue: Secondary | ICD-10-CM

## 2019-10-26 DIAGNOSIS — Z8709 Personal history of other diseases of the respiratory system: Secondary | ICD-10-CM

## 2019-10-26 DIAGNOSIS — M25561 Pain in right knee: Secondary | ICD-10-CM

## 2019-10-26 DIAGNOSIS — A6000 Herpesviral infection of urogenital system, unspecified: Secondary | ICD-10-CM

## 2019-10-26 DIAGNOSIS — Z8261 Family history of arthritis: Secondary | ICD-10-CM

## 2019-10-26 DIAGNOSIS — F329 Major depressive disorder, single episode, unspecified: Secondary | ICD-10-CM

## 2019-10-26 DIAGNOSIS — M7918 Myalgia, other site: Secondary | ICD-10-CM

## 2019-10-26 DIAGNOSIS — Z8659 Personal history of other mental and behavioral disorders: Secondary | ICD-10-CM

## 2019-10-26 NOTE — Progress Notes (Signed)
Office Visit Note  Patient: Bonnie Weaver             Date of Birth: Sep 08, 1986           MRN: [id number]             PCP: Alfonse Flavors, MD Referring: Alfonse Flavors,* Visit Date: 10/26/2019 Occupation: @GUAROCC @  Subjective:  Pain in all the joints and muscles.   History of Present Illness: Bonnie Weaver is a 34 y.o. female with history of joint pain.  She states she continues to have pain and discomfort in her both hands,, both knees and her feet.  She is also noticed intermittent swelling in her hands and feet.  Patient states that all of her joints and muscles are very painful.  Activities of Daily Living:  Patient reports morning stiffness for 2 hours.   Patient Reports nocturnal pain.  Difficulty dressing/grooming: Reports Difficulty climbing stairs: Reports Difficulty getting out of chair: Reports Difficulty using hands for taps, buttons, cutlery, and/or writing: Reports  Review of Systems  Constitutional: Positive for fatigue. Negative for night sweats, weight gain and weight loss.  HENT: Positive for nose dryness. Negative for mouth sores, trouble swallowing, trouble swallowing and mouth dryness.        Nose sores  Eyes: Positive for pain, itching and dryness. Negative for redness and visual disturbance.  Respiratory: Negative for cough, shortness of breath, wheezing and difficulty breathing.   Cardiovascular: Negative for chest pain, palpitations, hypertension, irregular heartbeat and swelling in legs/feet.  Gastrointestinal: Negative for blood in stool, constipation and diarrhea.  Endocrine: Negative for increased urination.  Genitourinary: Negative for difficulty urinating, painful urination and vaginal dryness.  Musculoskeletal: Positive for arthralgias, joint pain, joint swelling and morning stiffness. Negative for myalgias, muscle weakness, muscle tenderness and myalgias.  Skin: Positive for color change and hair loss. Negative for rash,  skin tightness, ulcers and sensitivity to sunlight.  Allergic/Immunologic: Negative for susceptible to infections.  Neurological: Negative for dizziness, numbness, headaches, memory loss, night sweats and weakness.  Hematological: Negative for bruising/bleeding tendency and swollen glands.  Psychiatric/Behavioral: Negative for depressed mood, confusion and sleep disturbance. The patient is not nervous/anxious.     PMFS History:  Patient Active Problem List   Diagnosis Date Noted  . History of asthma 09/21/2019  . Anxiety and depression 09/21/2019  . History of ADHD 09/21/2019  . Genital herpes simplex 09/21/2019    Past Medical History:  Diagnosis Date  . Anxiety   . Bipolar 1 disorder ([place])   . Kidney stone     Family History  Problem Relation Age of Onset  . Rheum arthritis Paternal Grandmother   . Diabetes Maternal Grandmother   . Lung cancer Maternal Grandfather   . Hypertension Father   . Autoimmune disease Father   . Other Mother        sepsis  . Asthma Sister   . Diabetes Sister   . Anxiety disorder Daughter   . Healthy Daughter    Past Surgical History:  Procedure Laterality Date  . CESAREAN SECTION    . LITHOTRIPSY     Social History   Social History Narrative  . Not on file    There is no immunization history on file for this patient.   Objective: Vital Signs: BP 116/75 (BP Location: Left Arm, Patient Position: Sitting, Cuff Size: Normal)   Pulse 90   Resp 15   Ht 5' 2"  (1.575 m)   Wt 177 lb (  80.3 kg)   BMI 32.37 kg/m    Physical Exam Vitals and nursing note reviewed.  Constitutional:      Appearance: She is well-developed.  HENT:     Head: Normocephalic and atraumatic.  Eyes:     Conjunctiva/sclera: Conjunctivae normal.  Cardiovascular:     Rate and Rhythm: Normal rate and regular rhythm.     Heart sounds: Normal heart sounds.  Pulmonary:     Effort: Pulmonary effort is normal.     Breath sounds: Normal breath sounds.  Abdominal:      General: Bowel sounds are normal.     Palpations: Abdomen is soft.  Musculoskeletal:     Cervical back: Normal range of motion.  Lymphadenopathy:     Cervical: No cervical adenopathy.  Skin:    General: Skin is warm and dry.     Capillary Refill: Capillary refill takes less than 2 seconds.  Neurological:     Mental Status: She is alert and oriented to person, place, and time.  Psychiatric:        Behavior: Behavior normal.      Musculoskeletal Exam: Cervical spine, thoracic and lumbar spine with good range of motion.  She had generalized hyperalgesia and multiple positives noted.  Shoulder joints, elbow joints, wrist joints, MCPs PIPs and DIPs in good range of motion.  She has tenderness on palpation over all of her joints.  No synovitis was noted.  Hip joints, knee joints, ankles, MTPs and PIPs been good range of motion with no synovitis.  CDAI Exam: CDAI Score: -- Patient Global: --; Provider Global: -- Swollen: --; Tender: -- Joint Exam 10/26/2019   No joint exam has been documented for this visit   There is currently no information documented on the homunculus. Go to the Rheumatology activity and complete the homunculus joint exam.  Investigation: No additional findings.  Imaging: No results found.  Recent Labs: Lab Results  Component Value Date   WBC 10.7 09/21/2019   HGB 13.6 09/21/2019   PLT 303 09/21/2019   NA 141 09/21/2019   K 4.1 09/21/2019   CL 105 09/21/2019   CO2 23 09/21/2019   GLUCOSE 85 09/21/2019   BUN 12 09/21/2019   CREATININE 0.62 09/21/2019   BILITOT 0.3 09/21/2019   ALKPHOS 76 07/06/2008   AST 11 09/21/2019   ALT 7 09/21/2019   PROT 6.8 09/21/2019   PROT 6.6 09/21/2019   ALBUMIN 3.3 (L) 07/06/2008   CALCIUM 8.9 09/21/2019   GFRAA 137 09/21/2019   QFTBGOLDPLUS NEGATIVE 09/21/2019  September 21, 2019 TB Gold negative, SPEP negative, hepatitis B negative hepatitis C RNA quant negative, G6PD normal, HIV negative CK 58, TSH 1.05, UA showed 3+  hemoglobin and trace protein RF negative, anti-CCP negative, 14 3 3  eta negative, ANA negative, (double-stranded DNA SSA, SSB, Smith, RNP, SCL 70, anticardiolipin, beta-2, lupus anticoagulant negative), C3-C4 normal Feb 20, 2019 ESR 28  Speciality Comments: No specialty comments available.  Procedures:  No procedures performed Allergies: Sulfa antibiotics   Assessment / Plan:     Visit Diagnoses: Bilateral hand pain - Patient gives history of joint pain and joint swelling.  All autoimmune work-up is negative.  No synovitis was noted on the examination.- Plan: XR Hand 2 View Right, XR Hand 2 View Left.  X-ray of bilateral hands were within normal limits.  Pain in both wrists-she had tenderness on palpation of bilateral wrists with no synovitis was noted.  Pain in both feet - History of joint pain and  joint swelling.  No synovitis was noted.  Chronic pain of both knees -no warmth, swelling or effusion.  Plan: XR KNEE 3 VIEW RIGHT, XR KNEE 3 VIEW LEFT.  X-ray of bilateral knee joints were unremarkable.  Myofascial pain-patient has generalized hyperalgesia and positive tender points. She may have a component of fibromyalgia myofascial pain syndrome.  Other fatigue-she complains of generalized fatigue.  Sicca syndrome, unspecified (HCC)-he complains of dry mouth and dry eyes which could be related to the medications.  All autoimmune work-up is negative.  Raynaud's phenomenon without gangrene - According to patient there is history of autoimmune disease in her mother and paternal grandmother.  She had good capillary refill on examination.  Raynaud's is not unusual to be seen in 15% of females.  Anxiety and depression-patient is on medications.  History of ADHD  History of asthma  Genital herpes simplex, unspecified site  Orders: Orders Placed This Encounter  Procedures  . XR Hand 2 View Right  . XR Hand 2 View Left  . XR KNEE 3 VIEW RIGHT  . XR KNEE 3 VIEW LEFT   No orders of the  defined types were placed in this encounter.   Face-to-face time spent with patient was 30 minutes. Greater than 50% of time was spent in counseling and coordination of care.  Follow-Up Instructions: Return if symptoms worsen or fail to improve, for Myofascial pain.   Bo Merino, MD  Note - This record has been created using Editor, commissioning.  Chart creation errors have been sought, but may not always  have been located. Such creation errors do not reflect on  the standard of medical care.

## 2019-11-05 ENCOUNTER — Encounter: Payer: Self-pay | Admitting: Orthopaedic Surgery

## 2019-11-05 ENCOUNTER — Other Ambulatory Visit: Payer: Self-pay

## 2019-11-05 ENCOUNTER — Ambulatory Visit: Payer: Medicaid Other | Admitting: Orthopaedic Surgery

## 2019-11-05 VITALS — BP 108/70 | HR 80 | Temp 98.4°F | Ht 62.0 in | Wt 177.0 lb

## 2019-11-05 DIAGNOSIS — M79641 Pain in right hand: Secondary | ICD-10-CM

## 2019-11-05 DIAGNOSIS — M79642 Pain in left hand: Secondary | ICD-10-CM

## 2019-11-05 DIAGNOSIS — F17210 Nicotine dependence, cigarettes, uncomplicated: Secondary | ICD-10-CM

## 2019-11-05 MED ORDER — DICLOFENAC SODIUM 75 MG PO TBEC: DELAYED_RELEASE_TABLET | ORAL | 5 refills | Status: DC

## 2019-11-05 NOTE — Patient Instructions (Signed)
Steps to Quit Smoking Smoking tobacco is the leading cause of preventable death. It can affect almost every organ in the body. Smoking puts you and people around you at risk for many serious, long-lasting (chronic) diseases. Quitting smoking can be hard, but it is one of the best things that you can do for your health. It is never too late to quit. How do I get ready to quit? When you decide to quit smoking, make a plan to help you succeed. Before you quit:  Pick a date to quit. Set a date within the next 2 weeks to give you time to prepare.  Write down the reasons why you are quitting. Keep this list in places where you will see it often.  Tell your family, friends, and co-workers that you are quitting. Their support is important.  Talk with your doctor about the choices that may help you quit.  Find out if your health insurance will pay for these treatments.  Know the people, places, things, and activities that make you want to smoke (triggers). Avoid them. What first steps can I take to quit smoking?  Throw away all cigarettes at home, at work, and in your car.  Throw away the things that you use when you smoke, such as ashtrays and lighters.  Clean your car. Make sure to empty the ashtray.  Clean your home, including curtains and carpets. What can I do to help me quit smoking? Talk with your doctor about taking medicines and seeing a counselor at the same time. You are more likely to succeed when you do both.  If you are pregnant or breastfeeding, talk with your doctor about counseling or other ways to quit smoking. Do not take medicine to help you quit smoking unless your doctor tells you to do so. To quit smoking: Quit right away  Quit smoking totally, instead of slowly cutting back on how much you smoke over a period of time.  Go to counseling. You are more likely to quit if you go to counseling sessions regularly. Take medicine You may take medicines to help you quit. Some  medicines need a prescription, and some you can buy over-the-counter. Some medicines may contain a drug called nicotine to replace the nicotine in cigarettes. Medicines may:  Help you to stop having the desire to smoke (cravings).  Help to stop the problems that come when you stop smoking (withdrawal symptoms). Your doctor may ask you to use:  Nicotine patches, gum, or lozenges.  Nicotine inhalers or sprays.  Non-nicotine medicine that is taken by mouth. Find resources Find resources and other ways to help you quit smoking and remain smoke-free after you quit. These resources are most helpful when you use them often. They include:  Online chats with a counselor.  Phone quitlines.  Printed self-help materials.  Support groups or group counseling.  Text messaging programs.  Mobile phone apps. Use apps on your mobile phone or tablet that can help you stick to your quit plan. There are many free apps for mobile phones and tablets as well as websites. Examples include Quit Guide from the CDC and smokefree.gov  What things can I do to make it easier to quit?   Talk to your family and friends. Ask them to support and encourage you.  Call a phone quitline (1-800-QUIT-NOW), reach out to support groups, or work with a counselor.  Ask people who smoke to not smoke around you.  Avoid places that make you want to smoke,   such as: ? Bars. ? Parties. ? Smoke-break areas at work.  Spend time with people who do not smoke.  Lower the stress in your life. Stress can make you want to smoke. Try these things to help your stress: ? Getting regular exercise. ? Doing deep-breathing exercises. ? Doing yoga. ? Meditating. ? Doing a body scan. To do this, close your eyes, focus on one area of your body at a time from head to toe. Notice which parts of your body are tense. Try to relax the muscles in those areas. How will I feel when I quit smoking? Day 1 to 3 weeks Within the first 24 hours,  you may start to have some problems that come from quitting tobacco. These problems are very bad 2-3 days after you quit, but they do not often last for more than 2-3 weeks. You may get these symptoms:  Mood swings.  Feeling restless, nervous, angry, or annoyed.  Trouble concentrating.  Dizziness.  Strong desire for high-sugar foods and nicotine.  Weight gain.  Trouble pooping (constipation).  Feeling like you may vomit (nausea).  Coughing or a sore throat.  Changes in how the medicines that you take for other issues work in your body.  Depression.  Trouble sleeping (insomnia). Week 3 and afterward After the first 2-3 weeks of quitting, you may start to notice more positive results, such as:  Better sense of smell and taste.  Less coughing and sore throat.  Slower heart rate.  Lower blood pressure.  Clearer skin.  Better breathing.  Fewer sick days. Quitting smoking can be hard. Do not give up if you fail the first time. Some people need to try a few times before they succeed. Do your best to stick to your quit plan, and talk with your doctor if you have any questions or concerns. Summary  Smoking tobacco is the leading cause of preventable death. Quitting smoking can be hard, but it is one of the best things that you can do for your health.  When you decide to quit smoking, make a plan to help you succeed.  Quit smoking right away, not slowly over a period of time.  When you start quitting, seek help from your doctor, family, or friends. This information is not intended to replace advice given to you by your health care provider. Make sure you discuss any questions you have with your health care provider. Document Revised: 06/26/2019 Document Reviewed: 12/20/2018 Elsevier Patient Education  2020 Elsevier Inc.  

## 2019-11-05 NOTE — Progress Notes (Signed)
[name], female DOB:January 13, 1986, 34 y.o. KDT:[id number]  Chief Complaint  Patient presents with  . Hand Pain    Bilateral hand pain.    HPI  Bonnie Weaver is a 34 y.o. female who has chronic hand pain and swelling.  She was seen by Rheumatology.  I have read the notes.  It was thought the patient has more of a myofascial type pain than arthritis.  The patient is back on her diclofenac which helps. She has more swelling in the morning of the hands.  She has gloves.  She has no new trauma.   Body mass index is 32.37 kg/m.  ROS  Review of Systems  Constitutional: Positive for activity change.  Musculoskeletal: Positive for arthralgias, joint swelling and myalgias.  Psychiatric/Behavioral: The patient is nervous/anxious.   All other systems reviewed and are negative.   All other systems reviewed and are negative.  The following is a summary of the past history medically, past history surgically, known current medicines, social history and family history.  This information is gathered electronically by the computer from prior information and documentation.  I review this each visit and have found including this information at this point in the chart is beneficial and informative.    Past Medical History:  Diagnosis Date  . Anxiety   . Bipolar 1 disorder ([place])   . Kidney stone     Past Surgical History:  Procedure Laterality Date  . CESAREAN SECTION    . LITHOTRIPSY      Family History  Problem Relation Age of Onset  . Rheum arthritis Paternal Grandmother   . Diabetes Maternal Grandmother   . Lung cancer Maternal Grandfather   . Hypertension Father   . Autoimmune disease Father   . Other Mother        sepsis  . Asthma Sister   . Diabetes Sister   . Anxiety disorder Daughter   . Healthy Daughter     Social History Social History   Tobacco Use  . Smoking status: Current Every Day Smoker    Packs/day: 1.00    Types: Cigarettes  . Smokeless  tobacco: Never Used  Substance Use Topics  . Alcohol use: No  . Drug use: No    Allergies  Allergen Reactions  . Sulfa Antibiotics     Current Outpatient Medications  Medication Sig Dispense Refill  . acyclovir (ZOVIRAX) 800 MG tablet as needed.     Marland Kitchen albuterol (VENTOLIN HFA) 108 (90 Base) MCG/ACT inhaler as needed.     Marland Kitchen amphetamine-dextroamphetamine (ADDERALL) 20 MG tablet as needed.     . ARIPiprazole (ABILIFY) 15 MG tablet Take 15 mg by mouth daily.     Marland Kitchen desvenlafaxine (PRISTIQ) 100 MG 24 hr tablet Take 100 mg by mouth daily.     . diclofenac (VOLTAREN) 75 MG EC tablet TAKE 1 TABLET BY MOUTH TWICE A DAY WITH A MEAL. 60 tablet 5  . gabapentin (NEURONTIN) 100 MG capsule     . lamoTRIgine (LAMICTAL) 100 MG tablet     . lamoTRIgine (LAMICTAL) 200 MG tablet Take 200 mg by mouth daily.    . LamoTRIgine 300 MG TB24 24 hour tablet Take by mouth daily.    . predniSONE (STERAPRED UNI-PAK 21 TAB) 5 MG (21) TBPK tablet Take 6 pills first day; 5 pills second day; 4 pills third day; 3 pills fourth day; 2 pills next day and 1 pill last day. (Patient not taking: Reported on 10/26/2019) 21 tablet 0  No current facility-administered medications for this visit.     Physical Exam  Blood pressure 108/70, pulse 80, temperature 98.4 F (36.9 C), height 5\' 2"  (1.575 m), weight 177 lb (80.3 kg).  Constitutional: overall normal hygiene, normal nutrition, well developed, normal grooming, normal body habitus. Assistive device:none  Musculoskeletal: gait and station Limp none, muscle tone and strength are normal, no tremors or atrophy is present.  .  Neurological: coordination overall normal.  Deep tendon reflex/nerve stretch intact.  Sensation normal.  Cranial nerves II-XII intact.   Skin:   Normal overall no scars, lesions, ulcers or rashes. No psoriasis.  Psychiatric: Alert and oriented x 3.  Recent memory intact, remote memory unclear.  Normal mood and affect. Well groomed.  Good eye  contact.  Cardiovascular: overall no swelling, no varicosities, no edema bilaterally, normal temperatures of the legs and arms, no clubbing, cyanosis and good capillary refill.  Lymphatic: palpation is normal.  Her right hand has swelling of the fingers today, not the left.  She has tenderness but no redness.  ROM is decreased and painful.  NV intact.  All other systems reviewed and are negative   The patient has been educated about the nature of the problem(s) and counseled on treatment options.  The patient appeared to understand what I have discussed and is in agreement with it.  Encounter Diagnoses  Name Primary?  . Bilateral hand pain Yes  . Cigarette nicotine dependence without complication     PLAN Call if any problems.  Precautions discussed.  Continue current medications.   Return to clinic 3 months   I refilled her diclofenac. Electronically Signed , MD 1/21/202111:12 AM

## 2019-11-06 ENCOUNTER — Telehealth: Payer: Self-pay | Admitting: Rheumatology

## 2019-11-06 NOTE — Telephone Encounter (Signed)
Labs faxed as requested

## 2019-11-06 NOTE — Telephone Encounter (Signed)
Patient at [facility] in [place] now, and wants to know if you can fax over lab results over for Dr. To review. Fax# [phone] Attn. Illene Bolus

## 2020-02-01 ENCOUNTER — Encounter: Payer: Self-pay | Admitting: Rheumatology

## 2020-02-01 ENCOUNTER — Ambulatory Visit: Payer: Medicaid Other | Admitting: Rheumatology

## 2020-02-01 ENCOUNTER — Other Ambulatory Visit: Payer: Self-pay

## 2020-02-01 VITALS — BP 105/68 | HR 71 | Resp 15 | Ht 62.0 in | Wt 173.2 lb

## 2020-02-01 DIAGNOSIS — M79641 Pain in right hand: Secondary | ICD-10-CM | POA: Diagnosis not present

## 2020-02-01 DIAGNOSIS — I7300 Raynaud's syndrome without gangrene: Secondary | ICD-10-CM

## 2020-02-01 DIAGNOSIS — A6000 Herpesviral infection of urogenital system, unspecified: Secondary | ICD-10-CM

## 2020-02-01 DIAGNOSIS — Z8659 Personal history of other mental and behavioral disorders: Secondary | ICD-10-CM

## 2020-02-01 DIAGNOSIS — R21 Rash and other nonspecific skin eruption: Secondary | ICD-10-CM

## 2020-02-01 DIAGNOSIS — M25561 Pain in right knee: Secondary | ICD-10-CM | POA: Diagnosis not present

## 2020-02-01 DIAGNOSIS — M25562 Pain in left knee: Secondary | ICD-10-CM

## 2020-02-01 DIAGNOSIS — M3500 Sicca syndrome, unspecified: Secondary | ICD-10-CM

## 2020-02-01 DIAGNOSIS — M7918 Myalgia, other site: Secondary | ICD-10-CM

## 2020-02-01 DIAGNOSIS — Z8709 Personal history of other diseases of the respiratory system: Secondary | ICD-10-CM

## 2020-02-01 DIAGNOSIS — M79642 Pain in left hand: Secondary | ICD-10-CM

## 2020-02-01 DIAGNOSIS — F419 Anxiety disorder, unspecified: Secondary | ICD-10-CM

## 2020-02-01 DIAGNOSIS — F329 Major depressive disorder, single episode, unspecified: Secondary | ICD-10-CM

## 2020-02-01 DIAGNOSIS — R5383 Other fatigue: Secondary | ICD-10-CM

## 2020-02-01 DIAGNOSIS — G8929 Other chronic pain: Secondary | ICD-10-CM

## 2020-02-01 NOTE — Progress Notes (Signed)
Office Visit Note  Patient: Bonnie Weaver             Date of Birth: Jul 14, 1986           MRN: [id number]             PCP: Alfonse Flavors, MD Referring: Alfonse Flavors,* Visit Date: 02/01/2020 Occupation: @GUAROCC @  Subjective:  Other (rash on face)   History of Present Illness: Bonnie Weaver is a 34 y.o. female with history of Raynaud's.  She was seen on an urgent basis today.  She states last Wednesday which was about 5 days ago she developed a rash on her face.  She just took some pictures and showed me on her cell phone.  She states she also had rash on her forehead and some on her neck.  She has been out in the sun.  She states she was not using sunscreen.  She has been experiencing pain in her hands and her knees.  She experiences Raynaud's when exposed to the cold weather.  Activities of Daily Living:  Patient reports morning stiffness for 3-4 hours.   Patient Reports nocturnal pain.  Difficulty dressing/grooming: Denies Difficulty climbing stairs: Reports Difficulty getting out of chair: Denies Difficulty using hands for taps, buttons, cutlery, and/or writing: Reports  Review of Systems  Constitutional: Positive for fatigue. Negative for night sweats, weight gain and weight loss.  HENT: Negative for mouth sores, trouble swallowing, trouble swallowing, mouth dryness and nose dryness.   Eyes: Positive for dryness. Negative for pain, redness, itching and visual disturbance.  Respiratory: Negative for cough, shortness of breath and difficulty breathing.   Cardiovascular: Negative for chest pain, palpitations, hypertension, irregular heartbeat and swelling in legs/feet.  Gastrointestinal: Negative for blood in stool, constipation and diarrhea.  Endocrine: Negative for increased urination.  Genitourinary: Negative for difficulty urinating, painful urination and vaginal dryness.  Musculoskeletal: Positive for arthralgias, joint pain, joint swelling and  morning stiffness. Negative for myalgias, muscle weakness, muscle tenderness and myalgias.  Skin: Positive for color change, rash and redness. Negative for hair loss, skin tightness, ulcers and sensitivity to sunlight.  Allergic/Immunologic: Negative for susceptible to infections.  Neurological: Positive for numbness. Negative for dizziness, headaches, memory loss, night sweats and weakness.  Hematological: Negative for bruising/bleeding tendency and swollen glands.  Psychiatric/Behavioral: Negative for depressed mood, confusion and sleep disturbance. The patient is not nervous/anxious.     PMFS History:  Patient Active Problem List   Diagnosis Date Noted  . History of asthma 09/21/2019  . Anxiety and depression 09/21/2019  . History of ADHD 09/21/2019  . Genital herpes simplex 09/21/2019    Past Medical History:  Diagnosis Date  . Anxiety   . Bipolar 1 disorder ([place])   . Kidney stone     Family History  Problem Relation Age of Onset  . Rheum arthritis Paternal Grandmother   . Diabetes Maternal Grandmother   . Lung cancer Maternal Grandfather   . Hypertension Father   . Autoimmune disease Father   . Sleep apnea Father   . Other Mother        sepsis  . Asthma Sister   . Diabetes Sister   . Anxiety disorder Daughter   . Healthy Daughter    Past Surgical History:  Procedure Laterality Date  . CESAREAN SECTION    . LITHOTRIPSY     Social History   Social History Narrative  . Not on file    There is no immunization history  on file for this patient.   Objective: Vital Signs: BP 105/68 (BP Location: Left Arm, Patient Position: Sitting, Cuff Size: Normal)   Pulse 71   Resp 15   Ht 5\' 2"  (1.575 m)   Wt 173 lb 3.2 oz (78.6 kg)   BMI 31.68 kg/m    Physical Exam Vitals and nursing note reviewed.  Constitutional:      Appearance: She is well-developed.  HENT:     Head: Normocephalic and atraumatic.  Eyes:     Conjunctiva/sclera: Conjunctivae normal.    Cardiovascular:     Rate and Rhythm: Normal rate and regular rhythm.     Heart sounds: Normal heart sounds.  Pulmonary:     Effort: Pulmonary effort is normal.     Breath sounds: Normal breath sounds.  Abdominal:     General: Bowel sounds are normal.     Palpations: Abdomen is soft.  Musculoskeletal:     Cervical back: Normal range of motion.  Lymphadenopathy:     Cervical: No cervical adenopathy.  Skin:    General: Skin is warm and dry.     Capillary Refill: Capillary refill takes less than 2 seconds.     Findings: Erythema present.     Comments: Erythema noted on her face, forehead and her neck.  Neurological:     Mental Status: She is alert and oriented to person, place, and time.  Psychiatric:        Behavior: Behavior normal.      Musculoskeletal Exam: C-spine thoracic and lumbar spine with good range of motion.  Shoulder joints, elbow joints, wrist joints, MCPs and PIPs with good range of motion.  She has tenderness across all of her MCPs PIPs and DIPs but no synovitis was noted.  Hip joints in good range of motion.  She has painful range of motion of bilateral knee joints without any warmth swelling or effusion.  There was no tenderness across MTPs.  CDAI Exam: CDAI Score: -- Patient Global: --; Provider Global: -- Swollen: --; Tender: -- Joint Exam 02/01/2020   No joint exam has been documented for this visit   There is currently no information documented on the homunculus. Go to the Rheumatology activity and complete the homunculus joint exam.  Investigation: No additional findings.  Imaging: No results found.  Recent Labs: Lab Results  Component Value Date   WBC 10.7 09/21/2019   HGB 13.6 09/21/2019   PLT 303 09/21/2019   NA 141 09/21/2019   K 4.1 09/21/2019   CL 105 09/21/2019   CO2 23 09/21/2019   GLUCOSE 85 09/21/2019   BUN 12 09/21/2019   CREATININE 0.62 09/21/2019   BILITOT 0.3 09/21/2019   ALKPHOS 76 07/06/2008   AST 11 09/21/2019   ALT 7  09/21/2019   PROT 6.8 09/21/2019   PROT 6.6 09/21/2019   ALBUMIN 3.3 (L) 07/06/2008   CALCIUM 8.9 09/21/2019   GFRAA 137 09/21/2019   QFTBGOLDPLUS NEGATIVE 09/21/2019    Speciality Comments: No specialty comments available.  Procedures:  No procedures performed Allergies: Sulfa antibiotics   Assessment / Plan:     Visit Diagnoses: Rash-patient states the rash started on her face and her chest about 5 days ago.  She brought pictures on her cell phone.  She still has some erythema over her face and her forehead.  She has some redness on her chest as well.  I will obtain AVISE labs today.  All autoimmune work-up done in December was normal.  I will refer  her to dermatology for evaluation as well.  Have advised to use sunscreen on a regular basis.  Bilateral hand pain -she complains of pain and swelling in bilateral hands.  I do not see any visible swelling.  She has tenderness across MCPs PIPs and DIPs but no synovitis was noted.  X-ray of bilateral hands obtained at the last visit were within normal limits.All autoimmune work-up is negative.  Chronic pain of both knees - X-ray of bilateral knee joints were unremarkable at the last visit.  She continues to have pain and discomfort in her knee joints.  Raynaud's phenomenon without gangrene - According to patient there is history of autoimmune disease in her mother and paternal grandmother.  She states her hands turn cold in the colder weather.  She had good capillary refill.  Sicca syndrome, unspecified (HCC)-she complains of dry mouth and dry eyes.  Myofascial pain-she has generalized hyperalgesia and positive tender points.  Other fatigue  Genital herpes simplex, unspecified site  Anxiety and depression  History of asthma  History of ADHD  Orders: Orders Placed This Encounter  Procedures  . Ambulatory referral to Dermatology   No orders of the defined types were placed in this encounter.    Follow-Up Instructions: Return  in about 2 weeks (around 02/15/2020) for Rash and joint pain.   Pollyann Savoy, MD  Note - This record has been created using Animal nutritionist.  Chart creation errors have been sought, but may not always  have been located. Such creation errors do not reflect on  the standard of medical care.

## 2020-02-02 ENCOUNTER — Encounter: Payer: Self-pay | Admitting: Orthopaedic Surgery

## 2020-02-02 ENCOUNTER — Ambulatory Visit: Payer: Medicaid Other | Admitting: Orthopaedic Surgery

## 2020-02-02 ENCOUNTER — Other Ambulatory Visit: Payer: Self-pay

## 2020-02-02 VITALS — BP 111/74 | HR 71 | Ht 62.0 in | Wt 171.0 lb

## 2020-02-02 DIAGNOSIS — M79642 Pain in left hand: Secondary | ICD-10-CM | POA: Diagnosis not present

## 2020-02-02 DIAGNOSIS — M79641 Pain in right hand: Secondary | ICD-10-CM

## 2020-02-02 DIAGNOSIS — M542 Cervicalgia: Secondary | ICD-10-CM

## 2020-02-02 DIAGNOSIS — F17210 Nicotine dependence, cigarettes, uncomplicated: Secondary | ICD-10-CM | POA: Diagnosis not present

## 2020-02-02 NOTE — Progress Notes (Signed)
[name], female DOB:Jun 01, 1986, 34 y.o. ZJI:[id number]  Chief Complaint  Patient presents with  . Hand Pain    HPI  Bonnie Weaver is a 34 y.o. female who has chronic hand pain and has developed neck pain in the mornings.  She has no new trauma. She is taking her diclofenac which helps.  She saw rheumatologist yesterday because her hand pain was worse and she has developed facial rash that will not go away.  She has been referred to dermatology.  She may have arthritis that affects the skin.  We will see.   Body mass index is 31.28 kg/m.  ROS  Review of Systems  Constitutional: Positive for activity change.  Musculoskeletal: Positive for arthralgias, joint swelling and myalgias.  Psychiatric/Behavioral: The patient is nervous/anxious.   All other systems reviewed and are negative.   All other systems reviewed and are negative.  The following is a summary of the past history medically, past history surgically, known current medicines, social history and family history.  This information is gathered electronically by the computer from prior information and documentation.  I review this each visit and have found including this information at this point in the chart is beneficial and informative.    Past Medical History:  Diagnosis Date  . Anxiety   . Bipolar 1 disorder ([place])   . Kidney stone     Past Surgical History:  Procedure Laterality Date  . CESAREAN SECTION    . LITHOTRIPSY      Family History  Problem Relation Age of Onset  . Rheum arthritis Paternal Grandmother   . Diabetes Maternal Grandmother   . Lung cancer Maternal Grandfather   . Hypertension Father   . Autoimmune disease Father   . Sleep apnea Father   . Other Mother        sepsis  . Asthma Sister   . Diabetes Sister   . Anxiety disorder Daughter   . Healthy Daughter     Social History Social History   Tobacco Use  . Smoking status: Current Every Day Smoker   Packs/day: 0.50    Types: Cigarettes  . Smokeless tobacco: Never Used  Substance Use Topics  . Alcohol use: No  . Drug use: No    Allergies  Allergen Reactions  . Sulfa Antibiotics     Current Outpatient Medications  Medication Sig Dispense Refill  . acyclovir (ZOVIRAX) 800 MG tablet as needed.     Marland Kitchen albuterol (VENTOLIN HFA) 108 (90 Base) MCG/ACT inhaler as needed.     Marland Kitchen amphetamine-dextroamphetamine (ADDERALL) 20 MG tablet as needed.     . ARIPiprazole (ABILIFY) 15 MG tablet Take 15 mg by mouth daily.     Marland Kitchen desvenlafaxine (PRISTIQ) 100 MG 24 hr tablet Take 100 mg by mouth daily.     . diclofenac (VOLTAREN) 75 MG EC tablet TAKE 1 TABLET BY MOUTH TWICE A DAY WITH A MEAL. 60 tablet 5  . gabapentin (NEURONTIN) 100 MG capsule     . lamoTRIgine (LAMICTAL) 200 MG tablet Take 200 mg by mouth daily.     No current facility-administered medications for this visit.     Physical Exam  Blood pressure 111/74, pulse 71, height 5\' 2"  (1.575 m), weight 171 lb (77.6 kg).  Constitutional: overall normal hygiene, normal nutrition, well developed, normal grooming, normal body habitus. Assistive device:none  Musculoskeletal: gait and station Limp none, muscle tone and strength are normal, no tremors or atrophy is present.  .  Neurological:  coordination overall normal.  Deep tendon reflex/nerve stretch intact.  Sensation normal.  Cranial nerves II-XII intact.   Skin:   Normal overall no scars, lesions, ulcers or rashes. No psoriasis.  Psychiatric: Alert and oriented x 3.  Recent memory intact, remote memory unclear.  Normal mood and affect. Well groomed.  Good eye contact.  Cardiovascular: overall no swelling, no varicosities, no edema bilaterally, normal temperatures of the legs and arms, no clubbing, cyanosis and good capillary refill.  Lymphatic: palpation is normal.  Neck with full ROM.  NV intact.  Hands have slight swelling of fingers but full motion and NV intact.  All other  systems reviewed and are negative   The patient has been educated about the nature of the problem(s) and counseled on treatment options.  The patient appeared to understand what I have discussed and is in agreement with it.  Encounter Diagnoses  Name Primary?  . Bilateral hand pain Yes  . Cigarette nicotine dependence without complication   . Neck pain     PLAN Call if any problems.  Precautions discussed.  Continue current medications.   Return to clinic 3 months   Electronically Signed Darreld Mclean, MD 4/20/20211:56 PM

## 2020-02-03 ENCOUNTER — Encounter: Payer: Self-pay | Admitting: Rheumatology

## 2020-02-12 NOTE — Progress Notes (Signed)
Office Visit Note  Patient: Bonnie Weaver             Date of Birth: 05-02-1986           MRN: [id number]             PCP: Alfonse Flavors, MD Referring: Alfonse Flavors,* Visit Date: 02/17/2020 Occupation: @GUAROCC @  Subjective:  Pain in multiple joints   History of Present Illness: Bonnie Weaver is a 34 y.o. female initially evaluated for rash and polyarthralgia.  She states she continues to have intermittent rash on her face and her arms.  She also continues to have discomfort in her hands and knee joints.  She denies any joint swelling.  She complains of dry mouth and dry eyes.  Activities of Daily Living:  Patient reports morning stiffness for 3 hours.   Patient Reports nocturnal pain.  Difficulty dressing/grooming: Denies Difficulty climbing stairs: Reports Difficulty getting out of chair: Denies Difficulty using hands for taps, buttons, cutlery, and/or writing: Reports  Review of Systems  Constitutional: Positive for fatigue. Negative for night sweats, weight gain and weight loss.  HENT: Positive for mouth dryness. Negative for mouth sores, trouble swallowing, trouble swallowing and nose dryness.   Eyes: Positive for dryness. Negative for pain, redness and visual disturbance.  Respiratory: Negative for cough, shortness of breath and difficulty breathing.   Cardiovascular: Negative for chest pain, palpitations, hypertension, irregular heartbeat and swelling in legs/feet.  Gastrointestinal: Negative for blood in stool, constipation and diarrhea.  Endocrine: Negative for increased urination.  Genitourinary: Negative for vaginal dryness.  Musculoskeletal: Positive for arthralgias and joint pain. Negative for joint swelling, myalgias, muscle weakness, morning stiffness, muscle tenderness and myalgias.  Skin: Positive for rash. Negative for color change, hair loss, skin tightness, ulcers and sensitivity to sunlight.  Allergic/Immunologic: Negative for  susceptible to infections.  Neurological: Negative for dizziness, memory loss, night sweats and weakness.  Hematological: Negative for swollen glands.  Psychiatric/Behavioral: Positive for depressed mood and sleep disturbance. The patient is nervous/anxious.     PMFS History:  Patient Active Problem List   Diagnosis Date Noted  . History of asthma 09/21/2019  . Anxiety and depression 09/21/2019  . History of ADHD 09/21/2019  . Genital herpes simplex 09/21/2019    Past Medical History:  Diagnosis Date  . Anxiety   . Bipolar 1 disorder ([place])   . Kidney stone     Family History  Problem Relation Age of Onset  . Rheum arthritis Paternal Grandmother   . Diabetes Maternal Grandmother   . Lung cancer Maternal Grandfather   . Hypertension Father   . Autoimmune disease Father   . Sleep apnea Father   . Other Mother        sepsis  . Asthma Sister   . Diabetes Sister   . Anxiety disorder Daughter   . Healthy Daughter    Past Surgical History:  Procedure Laterality Date  . CESAREAN SECTION    . LITHOTRIPSY     Social History   Social History Narrative  . Not on file    There is no immunization history on file for this patient.   Objective: Vital Signs: BP 105/72 (BP Location: Right Arm, Patient Position: Sitting, Cuff Size: Normal)   Pulse 76   Resp 14   Ht 5\' 2"  (1.575 m)   Wt 173 lb 6.4 oz (78.7 kg)   BMI 31.72 kg/m    Physical Exam Vitals and nursing note reviewed.  Constitutional:  Appearance: She is well-developed.  HENT:     Head: Normocephalic and atraumatic.  Eyes:     Conjunctiva/sclera: Conjunctivae normal.  Cardiovascular:     Rate and Rhythm: Normal rate and regular rhythm.     Heart sounds: Normal heart sounds.  Pulmonary:     Effort: Pulmonary effort is normal.     Breath sounds: Normal breath sounds.  Abdominal:     General: Bowel sounds are normal.     Palpations: Abdomen is soft.  Musculoskeletal:     Cervical back: Normal range of  motion.  Lymphadenopathy:     Cervical: No cervical adenopathy.  Skin:    General: Skin is warm and dry.     Capillary Refill: Capillary refill takes less than 2 seconds.  Neurological:     Mental Status: She is alert and oriented to person, place, and time.  Psychiatric:        Behavior: Behavior normal.      Musculoskeletal Exam: C-spine thoracic and lumbar spine were in good range of motion.  Shoulder joints, elbow joints, wrist joints, MCPs, PIPs and DIPs with good range of motion with no synovitis.  Hip joints, knee joints, ankles, MTPs and PIPs with good range of motion with no synovitis.  She has generalized hyperalgesia and positive tender points.  CDAI Exam: CDAI Score: -- Patient Global: --; Provider Global: -- Swollen: --; Tender: -- Joint Exam 02/17/2020   No joint exam has been documented for this visit   There is currently no information documented on the homunculus. Go to the Rheumatology activity and complete the homunculus joint exam.  Investigation: No additional findings.  Imaging: No results found.  Recent Labs: Lab Results  Component Value Date   WBC 10.7 09/21/2019   HGB 13.6 09/21/2019   PLT 303 09/21/2019   NA 141 09/21/2019   K 4.1 09/21/2019   CL 105 09/21/2019   CO2 23 09/21/2019   GLUCOSE 85 09/21/2019   BUN 12 09/21/2019   CREATININE 0.62 09/21/2019   BILITOT 0.3 09/21/2019   ALKPHOS 76 07/06/2008   AST 11 09/21/2019   ALT 7 09/21/2019   PROT 6.8 09/21/2019   PROT 6.6 09/21/2019   ALBUMIN 3.3 (L) 07/06/2008   CALCIUM 8.9 09/21/2019   GFRAA 137 09/21/2019   QFTBGOLDPLUS NEGATIVE 09/21/2019   February 03, 2020 AVISE lupus index -1.7, ANA negative, ENA negative, CB CAP negative, Jo 1 antibody negative, anticardiolipin negative, beta-2 GP 1 -, phosphatidylserine antibodies negative.  RF negative, anti-CCP negative, anti car-P negative.  Anti-TPO negative, antithyroglobulin negative, antihistone negative  Speciality Comments: No specialty  comments available.  Procedures:  No procedures performed Allergies: Sulfa antibiotics   Assessment / Plan:     Visit Diagnoses: Rash -patient has intermittent rash on her face and extremities.  She has some redness on her face but no other rash was noted today.  We obtained AVISE at the last visit which is completely negative.  All the labs were discussed at length.  Rashes not related to autoimmune disease.  Bilateral hand pain - XR unremarkable.  Autoimmune workup negative.   Chronic pain of both knees-she had x-rays obtained last visit which were unremarkable.  I believe her arthralgias are related to myofascial pain.  Raynaud's phenomenon without gangrene-she states she has Raynaud's symptoms intermittently with the weather change but has been doing well currently.  Sicca syndrome (HCC)-most likely related to medications.  Myofascial pain-she continues to have some generalized pain and discomfort.  She also  has hyperalgesia.  Other fatigue-she continues to have fatigue which most likely is related to myofascial pain.  Other medical problems are listed as follows:  Genital herpes simplex, unspecified site  Anxiety and depression  History of asthma  History of ADHD  Family history of rheumatoid arthritis  Orders: No orders of the defined types were placed in this encounter.  No orders of the defined types were placed in this encounter.     Follow-Up Instructions: Return if symptoms worsen or fail to improve, for Polyarthralgia.   Pollyann Savoy, MD  Note - This record has been created using Animal nutritionist.  Chart creation errors have been sought, but may not always  have been located. Such creation errors do not reflect on  the standard of medical care.

## 2020-02-17 ENCOUNTER — Ambulatory Visit: Payer: Medicaid Other | Admitting: Rheumatology

## 2020-02-17 ENCOUNTER — Other Ambulatory Visit: Payer: Self-pay

## 2020-02-17 ENCOUNTER — Encounter: Payer: Self-pay | Admitting: Rheumatology

## 2020-02-17 VITALS — BP 105/72 | HR 76 | Resp 14 | Ht 62.0 in | Wt 173.4 lb

## 2020-02-17 DIAGNOSIS — A6000 Herpesviral infection of urogenital system, unspecified: Secondary | ICD-10-CM

## 2020-02-17 DIAGNOSIS — M79641 Pain in right hand: Secondary | ICD-10-CM

## 2020-02-17 DIAGNOSIS — M79642 Pain in left hand: Secondary | ICD-10-CM

## 2020-02-17 DIAGNOSIS — M25562 Pain in left knee: Secondary | ICD-10-CM

## 2020-02-17 DIAGNOSIS — R21 Rash and other nonspecific skin eruption: Secondary | ICD-10-CM | POA: Diagnosis not present

## 2020-02-17 DIAGNOSIS — M25561 Pain in right knee: Secondary | ICD-10-CM | POA: Diagnosis not present

## 2020-02-17 DIAGNOSIS — R5383 Other fatigue: Secondary | ICD-10-CM

## 2020-02-17 DIAGNOSIS — F329 Major depressive disorder, single episode, unspecified: Secondary | ICD-10-CM

## 2020-02-17 DIAGNOSIS — I7300 Raynaud's syndrome without gangrene: Secondary | ICD-10-CM | POA: Diagnosis not present

## 2020-02-17 DIAGNOSIS — M3500 Sicca syndrome, unspecified: Secondary | ICD-10-CM

## 2020-02-17 DIAGNOSIS — Z8261 Family history of arthritis: Secondary | ICD-10-CM

## 2020-02-17 DIAGNOSIS — Z8709 Personal history of other diseases of the respiratory system: Secondary | ICD-10-CM

## 2020-02-17 DIAGNOSIS — F419 Anxiety disorder, unspecified: Secondary | ICD-10-CM

## 2020-02-17 DIAGNOSIS — F32A Depression, unspecified: Secondary | ICD-10-CM

## 2020-02-17 DIAGNOSIS — Z8659 Personal history of other mental and behavioral disorders: Secondary | ICD-10-CM

## 2020-02-17 DIAGNOSIS — G8929 Other chronic pain: Secondary | ICD-10-CM

## 2020-02-17 DIAGNOSIS — M7918 Myalgia, other site: Secondary | ICD-10-CM

## 2020-03-15 DIAGNOSIS — L858 Other specified epidermal thickening: Secondary | ICD-10-CM | POA: Insufficient documentation

## 2020-03-15 DIAGNOSIS — L821 Other seborrheic keratosis: Secondary | ICD-10-CM | POA: Insufficient documentation

## 2020-03-15 DIAGNOSIS — L219 Seborrheic dermatitis, unspecified: Secondary | ICD-10-CM | POA: Insufficient documentation

## 2020-03-15 DIAGNOSIS — L301 Dyshidrosis [pompholyx]: Secondary | ICD-10-CM | POA: Insufficient documentation

## 2020-04-21 DIAGNOSIS — L299 Pruritus, unspecified: Secondary | ICD-10-CM | POA: Insufficient documentation

## 2020-05-03 ENCOUNTER — Ambulatory Visit: Payer: Medicaid Other | Admitting: Orthopaedic Surgery

## 2020-06-23 ENCOUNTER — Other Ambulatory Visit (HOSPITAL_COMMUNITY): Payer: Self-pay | Admitting: Family Medicine

## 2020-06-23 DIAGNOSIS — N6452 Nipple discharge: Secondary | ICD-10-CM

## 2020-06-23 DIAGNOSIS — R928 Other abnormal and inconclusive findings on diagnostic imaging of breast: Secondary | ICD-10-CM

## 2020-06-29 ENCOUNTER — Ambulatory Visit (HOSPITAL_COMMUNITY)
Admission: RE | Admit: 2020-06-29 | Discharge: 2020-06-29 | Disposition: A | Discharge status: Home / Self Care | Payer: Medicaid Other | Source: Ambulatory Visit | Attending: Family Medicine | Admitting: Family Medicine

## 2020-06-29 ENCOUNTER — Other Ambulatory Visit: Payer: Self-pay

## 2020-06-29 DIAGNOSIS — R928 Other abnormal and inconclusive findings on diagnostic imaging of breast: Secondary | ICD-10-CM | POA: Insufficient documentation

## 2020-06-29 DIAGNOSIS — N6452 Nipple discharge: Secondary | ICD-10-CM

## 2020-07-26 ENCOUNTER — Other Ambulatory Visit: Payer: Self-pay

## 2020-07-26 ENCOUNTER — Encounter: Payer: Self-pay | Admitting: Orthopaedic Surgery

## 2020-07-26 ENCOUNTER — Ambulatory Visit (INDEPENDENT_AMBULATORY_CARE_PROVIDER_SITE_OTHER): Payer: Medicaid Other | Admitting: Orthopaedic Surgery

## 2020-07-26 VITALS — BP 123/80 | HR 83 | Ht 62.0 in | Wt 166.0 lb

## 2020-07-26 DIAGNOSIS — M79642 Pain in left hand: Secondary | ICD-10-CM | POA: Diagnosis not present

## 2020-07-26 DIAGNOSIS — M79641 Pain in right hand: Secondary | ICD-10-CM | POA: Diagnosis not present

## 2020-07-26 DIAGNOSIS — M542 Cervicalgia: Secondary | ICD-10-CM | POA: Diagnosis not present

## 2020-07-26 DIAGNOSIS — F17210 Nicotine dependence, cigarettes, uncomplicated: Secondary | ICD-10-CM

## 2020-07-26 NOTE — Patient Instructions (Signed)
Steps to Quit Smoking Smoking tobacco is the leading cause of preventable death. It can affect almost every organ in the body. Smoking puts you and people around you at risk for many serious, long-lasting (chronic) diseases. Quitting smoking can be hard, but it is one of the best things that you can do for your health. It is never too late to quit. How do I get ready to quit? When you decide to quit smoking, make a plan to help you succeed. Before you quit:  Pick a date to quit. Set a date within the next 2 weeks to give you time to prepare.  Write down the reasons why you are quitting. Keep this list in places where you will see it often.  Tell your family, friends, and co-workers that you are quitting. Their support is important.  Talk with your doctor about the choices that may help you quit.  Find out if your health insurance will pay for these treatments.  Know the people, places, things, and activities that make you want to smoke (triggers). Avoid them. What first steps can I take to quit smoking?  Throw away all cigarettes at home, at work, and in your car.  Throw away the things that you use when you smoke, such as ashtrays and lighters.  Clean your car. Make sure to empty the ashtray.  Clean your home, including curtains and carpets. What can I do to help me quit smoking? Talk with your doctor about taking medicines and seeing a counselor at the same time. You are more likely to succeed when you do both.  If you are pregnant or breastfeeding, talk with your doctor about counseling or other ways to quit smoking. Do not take medicine to help you quit smoking unless your doctor tells you to do so. To quit smoking: Quit right away  Quit smoking totally, instead of slowly cutting back on how much you smoke over a period of time.  Go to counseling. You are more likely to quit if you go to counseling sessions regularly. Take medicine You may take medicines to help you quit. Some  medicines need a prescription, and some you can buy over-the-counter. Some medicines may contain a drug called nicotine to replace the nicotine in cigarettes. Medicines may:  Help you to stop having the desire to smoke (cravings).  Help to stop the problems that come when you stop smoking (withdrawal symptoms). Your doctor may ask you to use:  Nicotine patches, gum, or lozenges.  Nicotine inhalers or sprays.  Non-nicotine medicine that is taken by mouth. Find resources Find resources and other ways to help you quit smoking and remain smoke-free after you quit. These resources are most helpful when you use them often. They include:  Online chats with a counselor.  Phone quitlines.  Printed self-help materials.  Support groups or group counseling.  Text messaging programs.  Mobile phone apps. Use apps on your mobile phone or tablet that can help you stick to your quit plan. There are many free apps for mobile phones and tablets as well as websites. Examples include Quit Guide from the CDC and smokefree.gov  What things can I do to make it easier to quit?   Talk to your family and friends. Ask them to support and encourage you.  Call a phone quitline (1-800-QUIT-NOW), reach out to support groups, or work with a counselor.  Ask people who smoke to not smoke around you.  Avoid places that make you want to smoke,   such as: ? Bars. ? Parties. ? Smoke-break areas at work.  Spend time with people who do not smoke.  Lower the stress in your life. Stress can make you want to smoke. Try these things to help your stress: ? Getting regular exercise. ? Doing deep-breathing exercises. ? Doing yoga. ? Meditating. ? Doing a body scan. To do this, close your eyes, focus on one area of your body at a time from head to toe. Notice which parts of your body are tense. Try to relax the muscles in those areas. How will I feel when I quit smoking? Day 1 to 3 weeks Within the first 24 hours,  you may start to have some problems that come from quitting tobacco. These problems are very bad 2-3 days after you quit, but they do not often last for more than 2-3 weeks. You may get these symptoms:  Mood swings.  Feeling restless, nervous, angry, or annoyed.  Trouble concentrating.  Dizziness.  Strong desire for high-sugar foods and nicotine.  Weight gain.  Trouble pooping (constipation).  Feeling like you may vomit (nausea).  Coughing or a sore throat.  Changes in how the medicines that you take for other issues work in your body.  Depression.  Trouble sleeping (insomnia). Week 3 and afterward After the first 2-3 weeks of quitting, you may start to notice more positive results, such as:  Better sense of smell and taste.  Less coughing and sore throat.  Slower heart rate.  Lower blood pressure.  Clearer skin.  Better breathing.  Fewer sick days. Quitting smoking can be hard. Do not give up if you fail the first time. Some people need to try a few times before they succeed. Do your best to stick to your quit plan, and talk with your doctor if you have any questions or concerns. Summary  Smoking tobacco is the leading cause of preventable death. Quitting smoking can be hard, but it is one of the best things that you can do for your health.  When you decide to quit smoking, make a plan to help you succeed.  Quit smoking right away, not slowly over a period of time.  When you start quitting, seek help from your doctor, family, or friends. This information is not intended to replace advice given to you by your health care provider. Make sure you discuss any questions you have with your health care provider. Document Revised: 06/26/2019 Document Reviewed: 12/20/2018 Elsevier Patient Education  2020 Elsevier Inc.  

## 2020-07-26 NOTE — Progress Notes (Signed)
Patient IO:[id number] Bonnie Weaver, female DOB:02-20-1986, 34 y.o. KXF:[id number]  Chief Complaint  Patient presents with  . Hand Pain    bilateral   . Neck Pain    HPI  Bonnie Weaver is a 34 y.o. female who has multiple joint pains, mainly the hands in the morning and the neck.  She is followed by rheumatology.  She saw the dermatologist and was told she does not have psoriasis.  She has no new trauma.  She is doing well with the warm weather.   Body mass index is 30.36 kg/m.  ROS  Review of Systems  Constitutional: Positive for activity change.  Musculoskeletal: Positive for arthralgias, joint swelling and myalgias.  Psychiatric/Behavioral: The patient is nervous/anxious.   All other systems reviewed and are negative.   All other systems reviewed and are negative.  The following is a summary of the past history medically, past history surgically, known current medicines, social history and family history.  This information is gathered electronically by the computer from prior information and documentation.  I review this each visit and have found including this information at this point in the chart is beneficial and informative.    Past Medical History:  Diagnosis Date  . Anxiety   . Bipolar 1 disorder (HCC)   . Kidney stone     Past Surgical History:  Procedure Laterality Date  . CESAREAN SECTION    . LITHOTRIPSY      Family History  Problem Relation Age of Onset  . Rheum arthritis Paternal Grandmother   . Diabetes Maternal Grandmother   . Lung cancer Maternal Grandfather   . Hypertension Father   . Autoimmune disease Father   . Sleep apnea Father   . Other Mother        sepsis  . Asthma Sister   . Diabetes Sister   . Anxiety disorder Daughter   . Healthy Daughter     Social History Social History   Tobacco Use  . Smoking status: Current Every Day Smoker    Packs/day: 0.50    Years: 15.00    Pack years: 7.50    Types: Cigarettes  . Smokeless  tobacco: Never Used  Vaping Use  . Vaping Use: Never used  Substance Use Topics  . Alcohol use: No  . Drug use: No    Allergies  Allergen Reactions  . Codeine Rash  . Sulfa Antibiotics     Current Outpatient Medications  Medication Sig Dispense Refill  . acyclovir (ZOVIRAX) 800 MG tablet as needed.     Marland Kitchen albuterol (VENTOLIN HFA) 108 (90 Base) MCG/ACT inhaler as needed.     Marland Kitchen amphetamine-dextroamphetamine (ADDERALL) 20 MG tablet as needed.     . ARIPiprazole (ABILIFY) 15 MG tablet Take 15 mg by mouth daily.     Marland Kitchen desvenlafaxine (PRISTIQ) 100 MG 24 hr tablet Take 100 mg by mouth daily.     . diclofenac (VOLTAREN) 75 MG EC tablet TAKE 1 TABLET BY MOUTH TWICE A DAY WITH A MEAL. 60 tablet 5  . gabapentin (NEURONTIN) 100 MG capsule     . lamoTRIgine (LAMICTAL) 200 MG tablet Take 200 mg by mouth daily.     No current facility-administered medications for this visit.     Physical Exam  Blood pressure 123/80, pulse 83, height 5\' 2"  (1.575 m), weight 166 lb (75.3 kg), last menstrual period 06/27/2020.  Constitutional: overall normal hygiene, normal nutrition, well developed, normal grooming, normal body habitus. Assistive device:none  Musculoskeletal: gait and  station Limp none, muscle tone and strength are normal, no tremors or atrophy is present.  .  Neurological: coordination overall normal.  Deep tendon reflex/nerve stretch intact.  Sensation normal.  Cranial nerves II-XII intact.   Skin:   Normal overall no scars, lesions, ulcers or rashes. No psoriasis.  Psychiatric: Alert and oriented x 3.  Recent memory intact, remote memory unclear.  Normal mood and affect. Well groomed.  Good eye contact.  Cardiovascular: overall no swelling, no varicosities, no edema bilaterally, normal temperatures of the legs and arms, no clubbing, cyanosis and good capillary refill.  Lymphatic: palpation is normal.  Hands have full ROM.  NV intact.  Neck has full ROM.  NV intact.  All other  systems reviewed and are negative   The patient has been educated about the nature of the problem(s) and counseled on treatment options.  The patient appeared to understand what I have discussed and is in agreement with it.  Encounter Diagnoses  Name Primary?  . Bilateral hand pain Yes  . Cigarette nicotine dependence without complication   . Neck pain     PLAN Call if any problems.  Precautions discussed.  Continue current medications.   Return to clinic 3 months   Electronically Signed Darreld Mclean, MD 10/12/20213:26 PM

## 2020-10-25 ENCOUNTER — Ambulatory Visit: Payer: Medicaid Other | Admitting: Orthopaedic Surgery

## 2021-01-12 IMAGING — MG DIGITAL DIAGNOSTIC BILAT W/ TOMO W/ CAD
8 series · 8 of 24 positions shown · non-contrast
Comparison: None.

CLINICAL DATA: 33-year-old female with spontaneous and non
spontaneous intermittent bilateral brownish nipple discharge.

EXAM:
DIGITAL DIAGNOSTIC BILATERAL MAMMOGRAM WITH TOMO AND CAD; ULTRASOUND
RIGHT BREAST LIMITED; ULTRASOUND LEFT BREAST LIMITED

[L CC synth-2D]
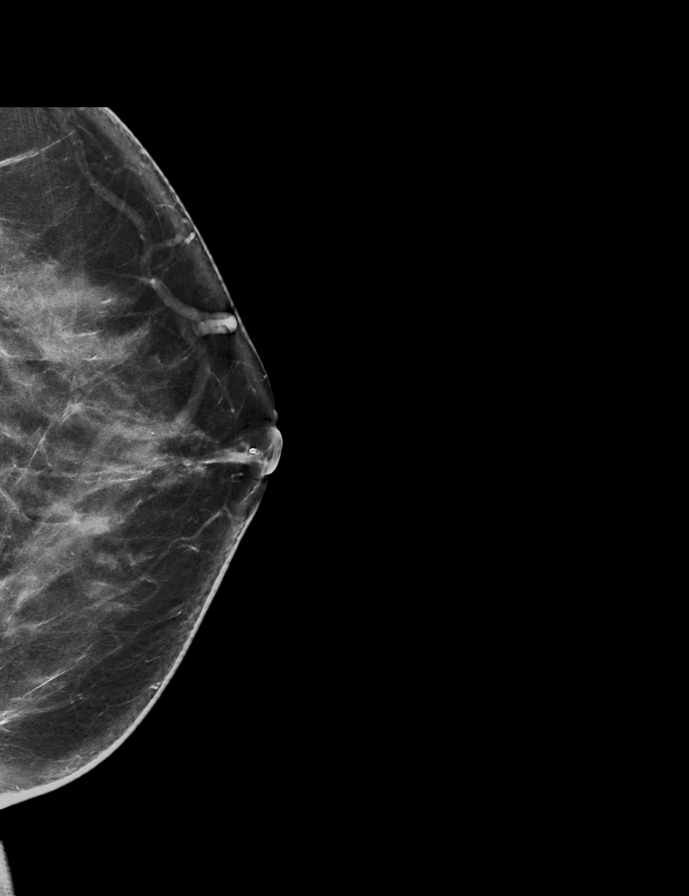

[R CC synth-2D]
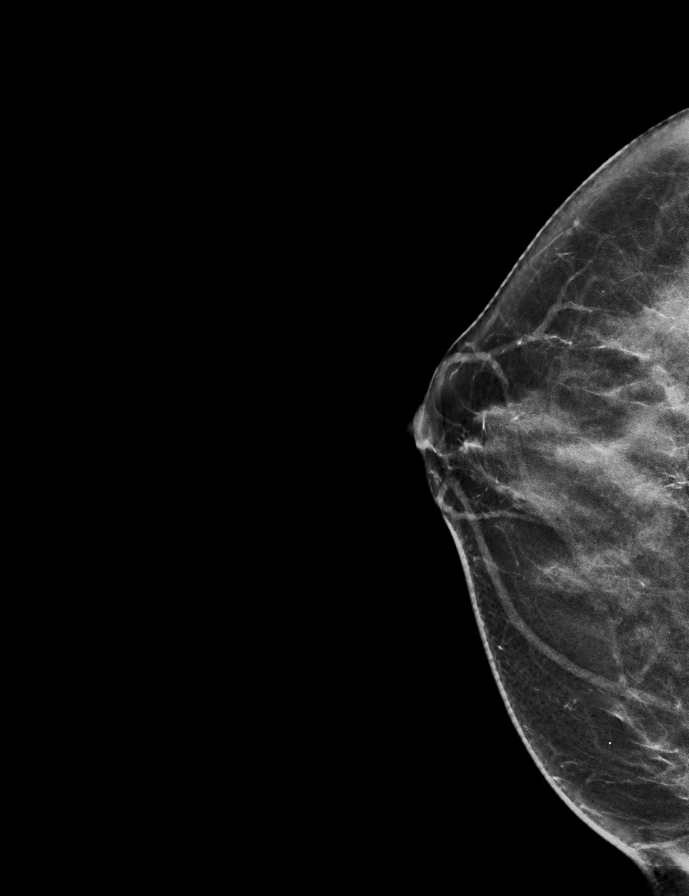

[L MLO synth-2D]
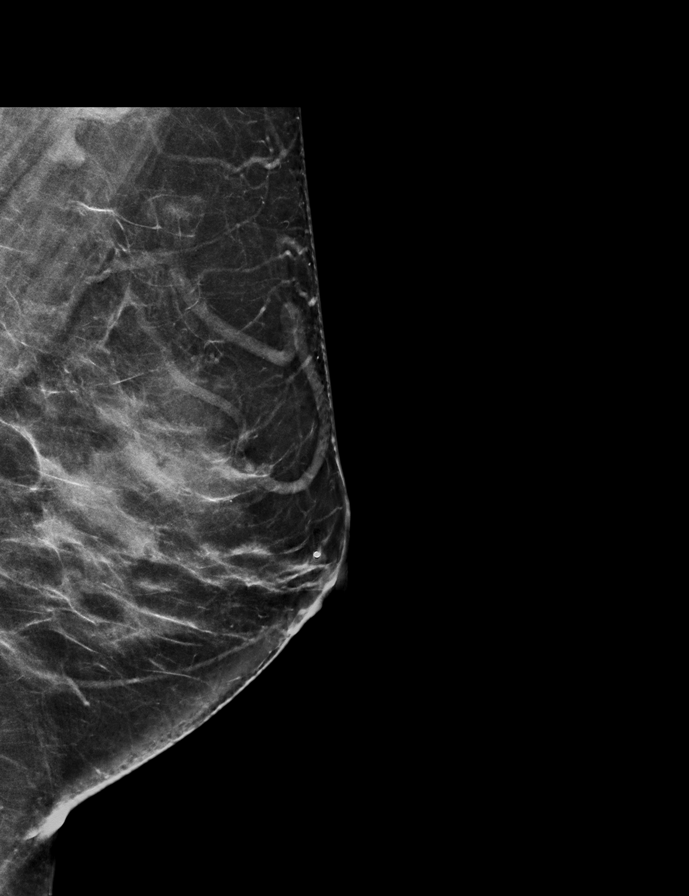

[R MLO synth-2D]
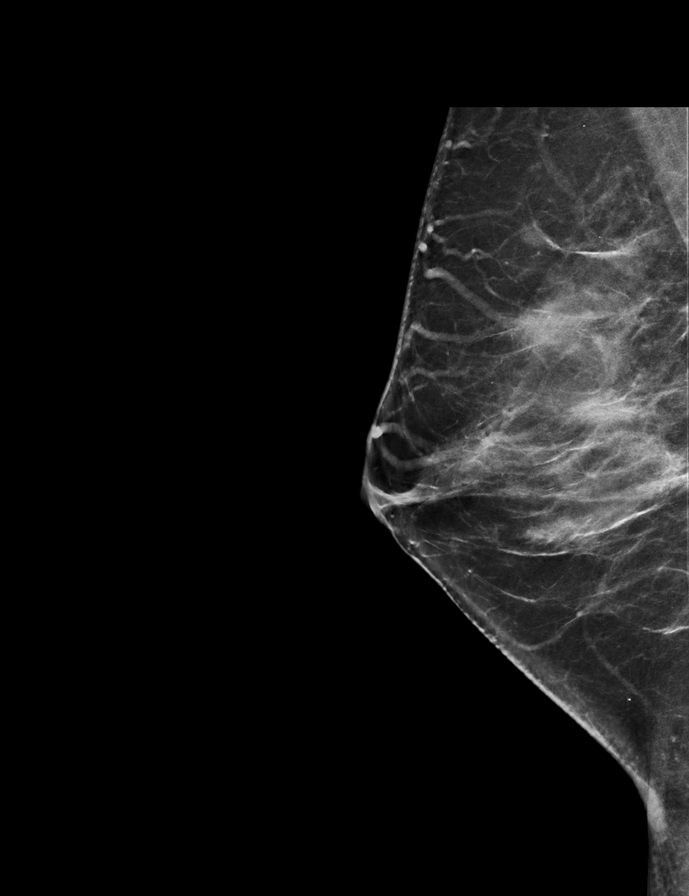

[R MLO tomo · tomo slice 35/70.0]
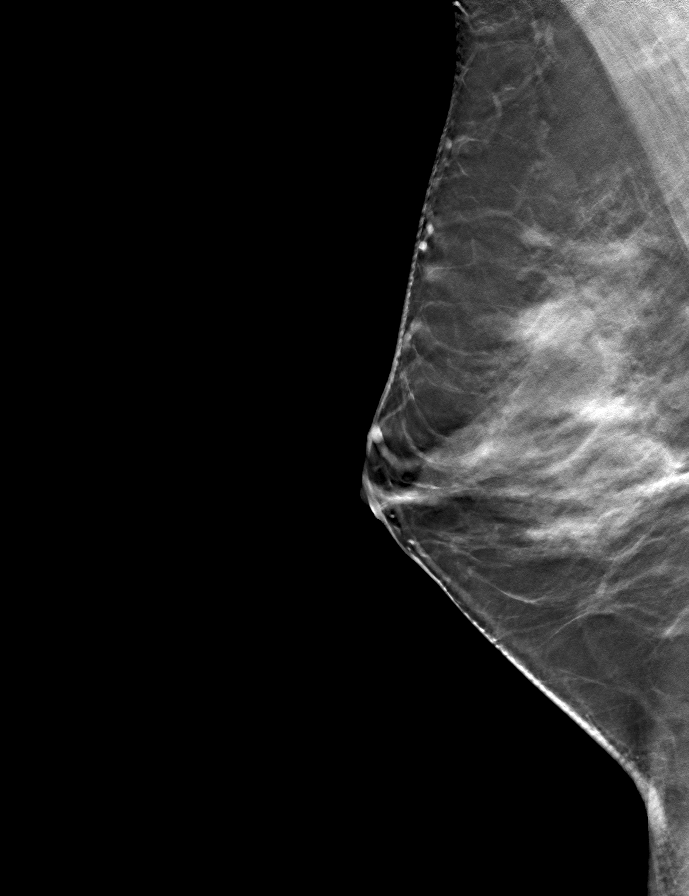

[L MLO tomo · tomo slice 36/71.0]
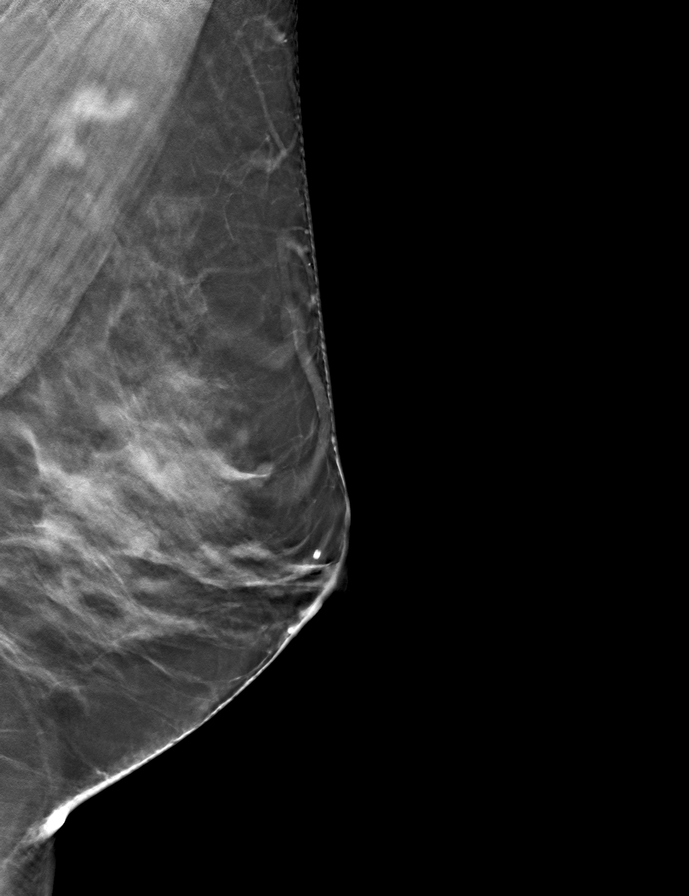

[R CC tomo · tomo slice 31/60.0]
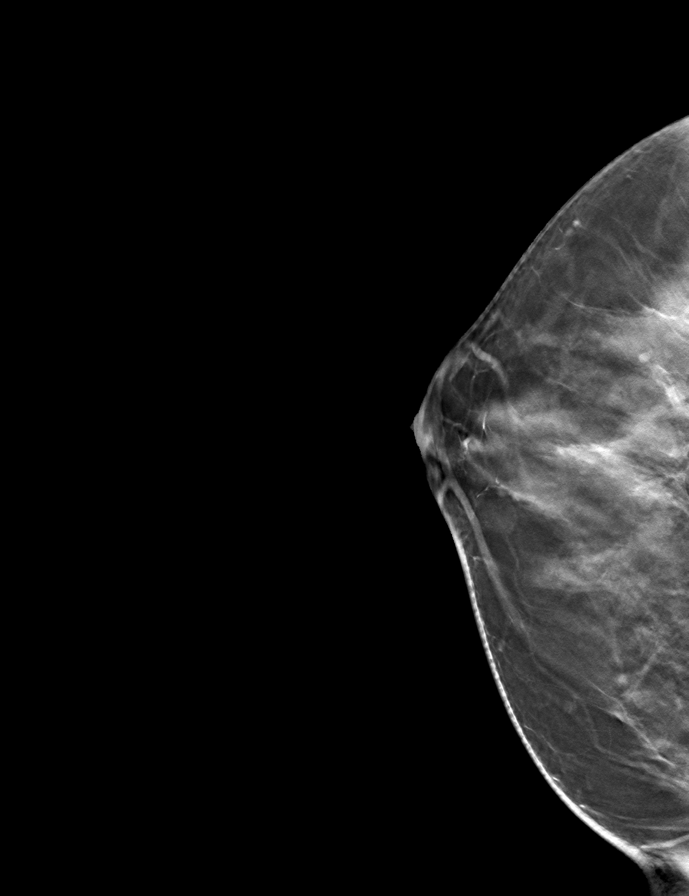

[L CC tomo · tomo slice 37/73.0]
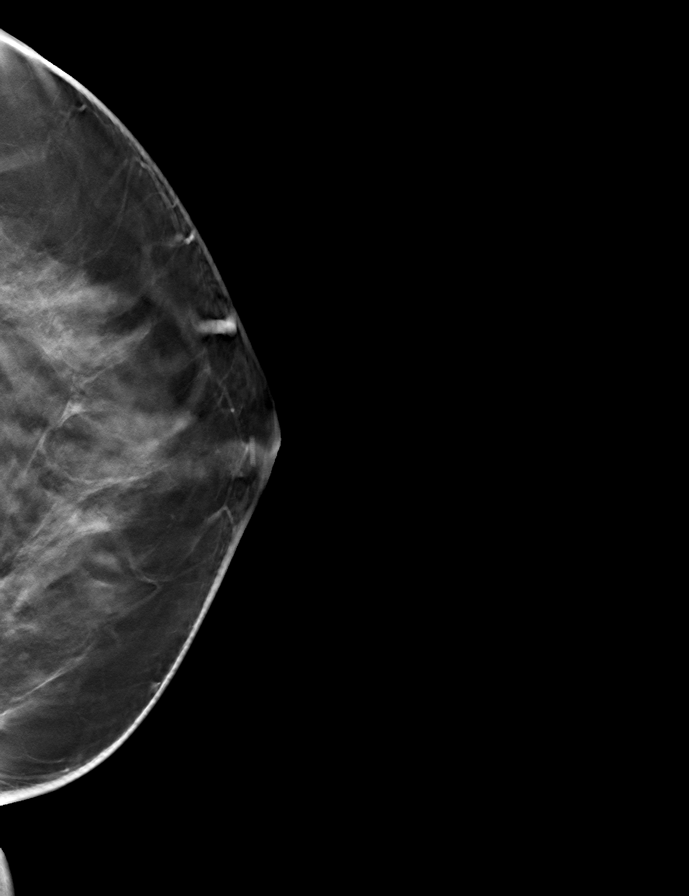

[8 of 24 positions shown; findings below may reference images not displayed]

ACR Breast Density Category c: The breast tissue is heterogeneously
dense, which may obscure small masses.
FINDINGS: There is an oval circumscribed mass in the upper central to slightly
outer right breast measuring approximately 0.6 cm. There are oval
masses with obscured margins within the upper-outer/outer left
breast. No retroareolar abnormalities identified mammographically to
explain bilateral nipple discharge. There is a possible prominent
lymph node in the left axilla.

Mammographic images were processed with CAD.

Targeted ultrasound of the retroareolar right breast was performed
with no suspicious masses or abnormality seen. No intraductal
masses. Numerous cysts and clusters of cysts are seen within the
superior and outer right breast. A cluster of cysts in the right
breast at the 11 to [DATE] position 6 cm from nipple measures 0.6 x
0.3 x 0.4 cm. This corresponds well with the mass seen in the
superior right breast at mammography.

Targeted ultrasound of the retroareolar left breast was performed
with no suspicious masses or abnormality seen. Numerous cysts and
clusters of cysts are seen within the upper and outer left breast.
No intraductal masses. A representative cluster of cysts at the 3
o'clock position 4 cm from nipple measures 0.7 x 0.4 x 0.5 cm. No
abnormal lymph nodes seen in the left axilla.
IMPRESSION: 1. No mammographic or sonographic abnormalities to explain bilateral
nipple discharge.

2.  No mammographic evidence of malignancy in either breast.

RECOMMENDATION:
1. Recommend further management of nipple discharge be based on
clinical assessment. This is felt to be physiologic/related to a
benign process given brownish color and bilaterality with symmetric
onset. If the patient experiences spontaneous unilateral bloody or
clear nipple discharge from a single duct only, then further
evaluation with breast MRI is recommended.

2. Screening mammogram at age 40 unless there are persistent or
intervening clinical concerns. (Code:PY-V-YQX)

I have discussed the findings and recommendations with the patient.
If applicable, a reminder letter will be sent to the patient
regarding the next appointment.

BI-RADS CATEGORY  2: Benign.

## 2021-01-12 IMAGING — US US BREAST*R* LIMITED INC AXILLA
1 series · 13 of 20 positions shown · non-contrast
Comparison: None.

CLINICAL DATA: 33-year-old female with spontaneous and non
spontaneous intermittent bilateral brownish nipple discharge.

EXAM:
DIGITAL DIAGNOSTIC BILATERAL MAMMOGRAM WITH TOMO AND CAD; ULTRASOUND
RIGHT BREAST LIMITED; ULTRASOUND LEFT BREAST LIMITED

[Series 1: us breast ltd uni right inc axilla · 13 of 20 slices shown]
[im 1/20]
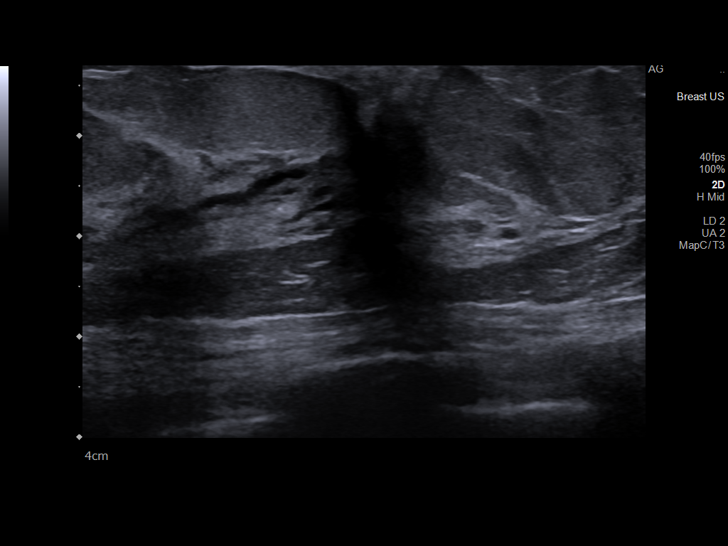
[im 3/20]
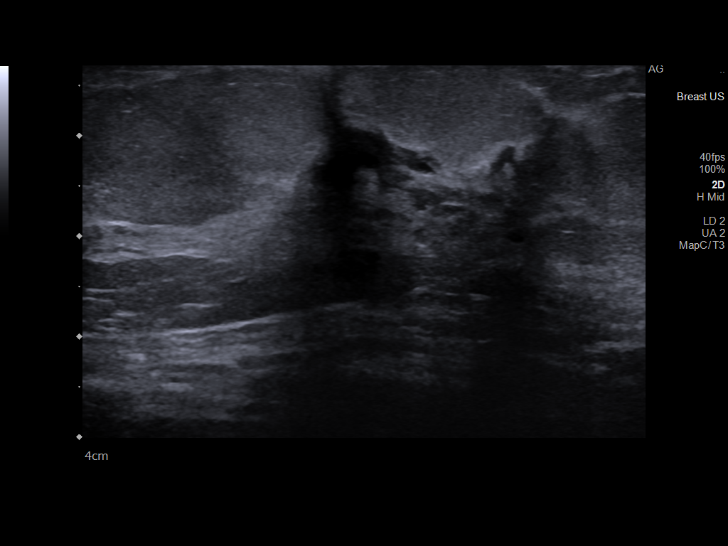
[im 4/20]
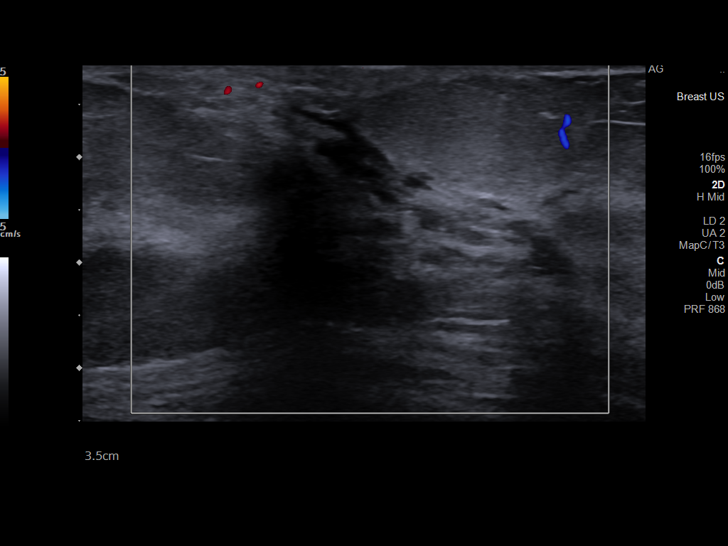
[im 6/20]
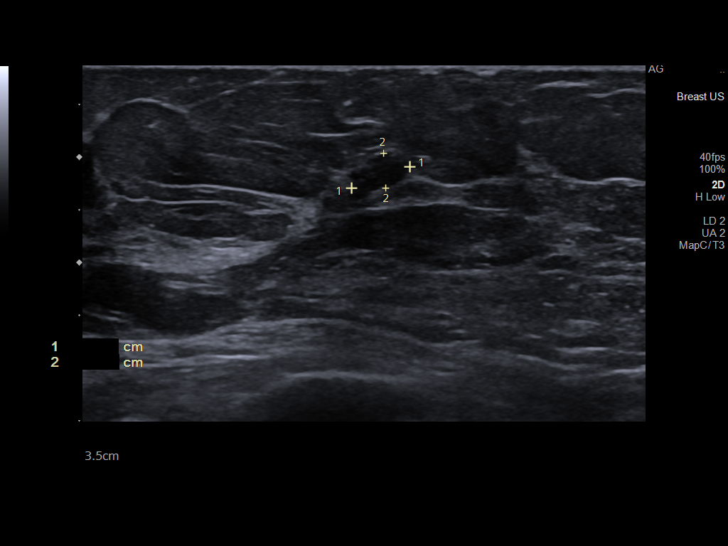
[im 7/20]
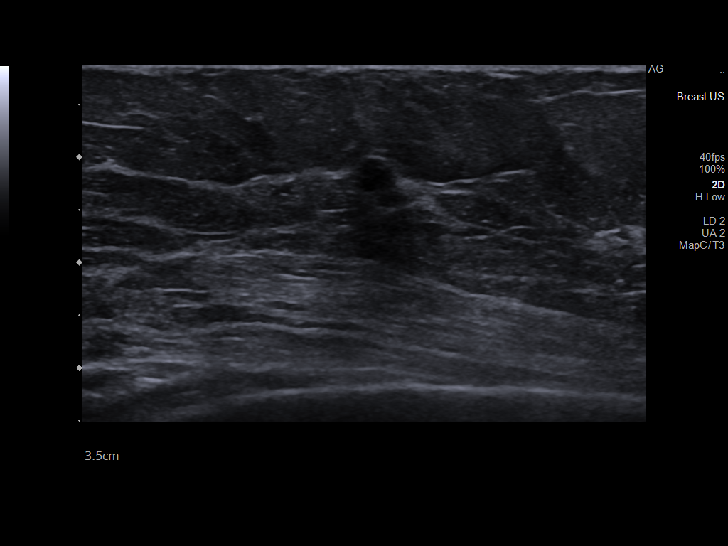
[im 9/20]
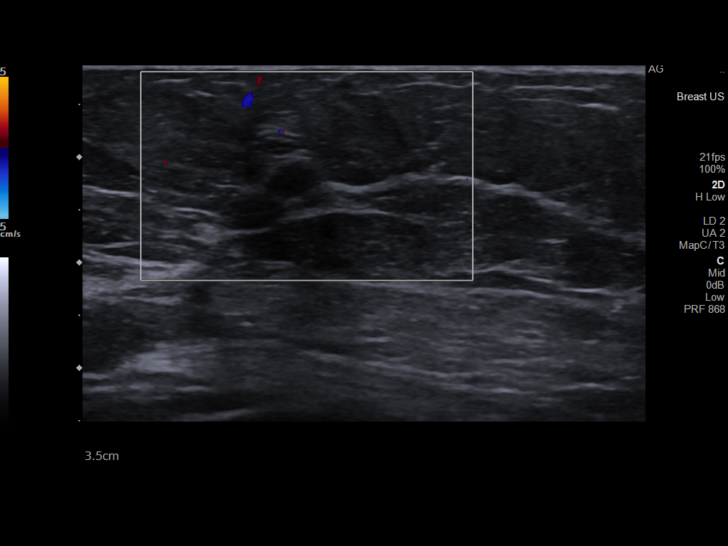
[im 11/20]
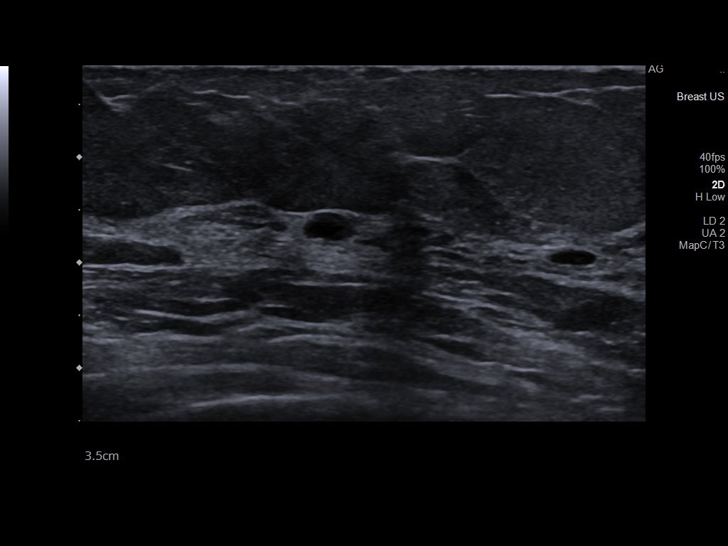
[im 12/20]
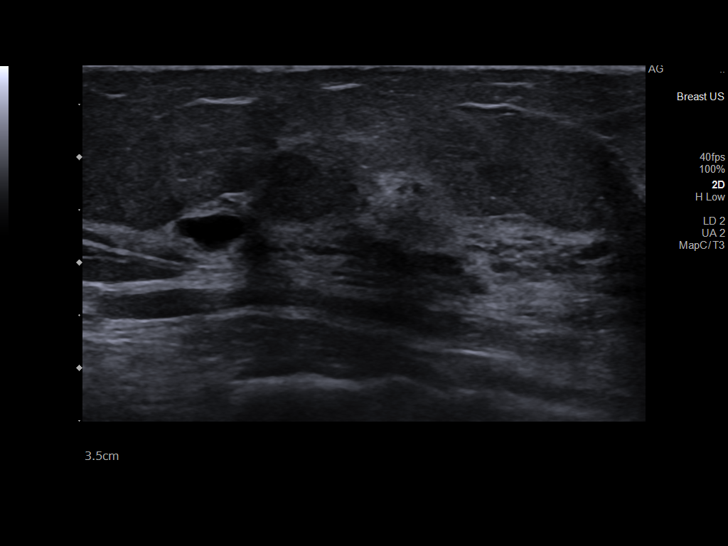
[im 14/20]
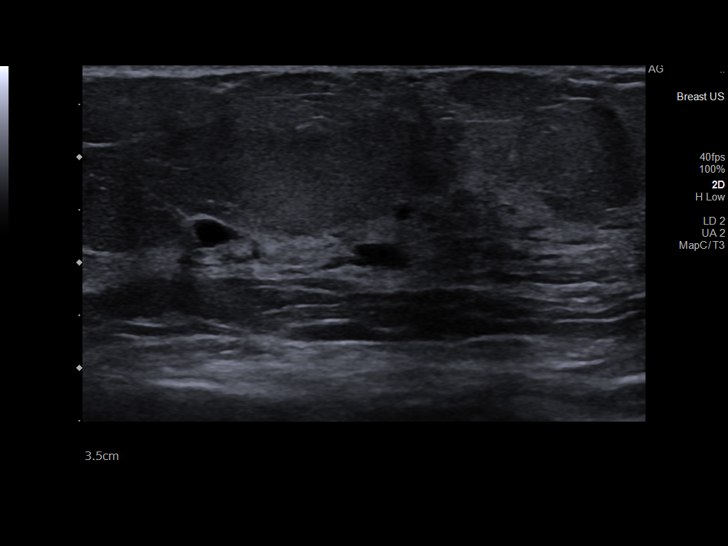
[im 15/20]
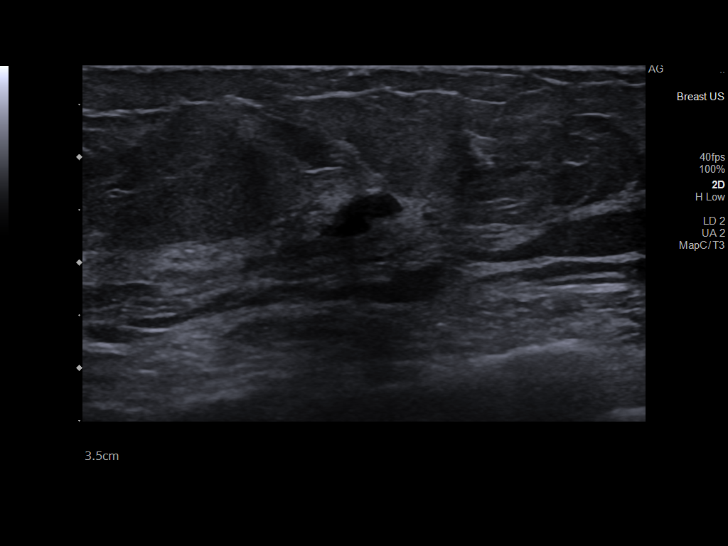
[im 17/20]
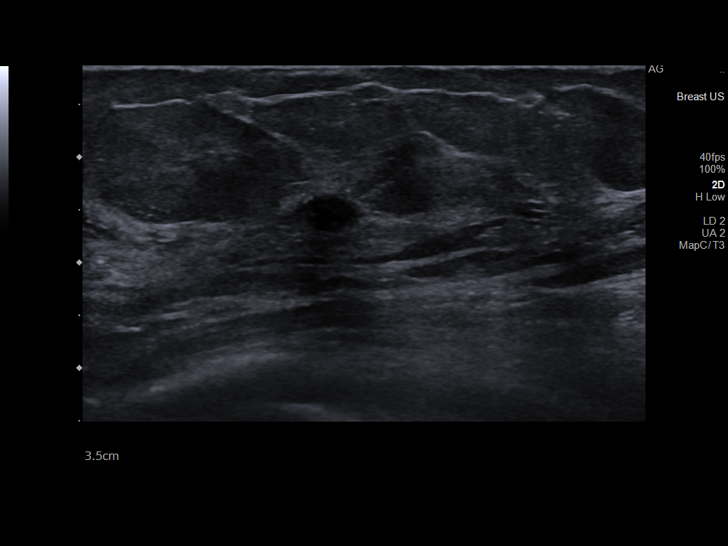
[im 18/20]
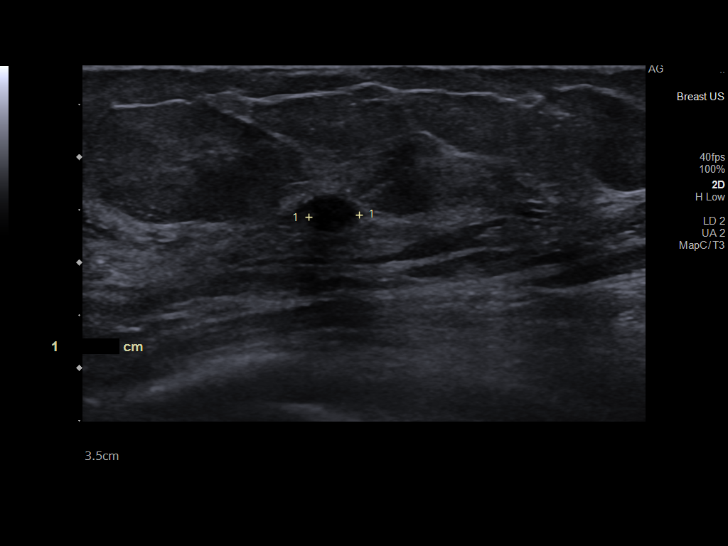
[im 20/20]
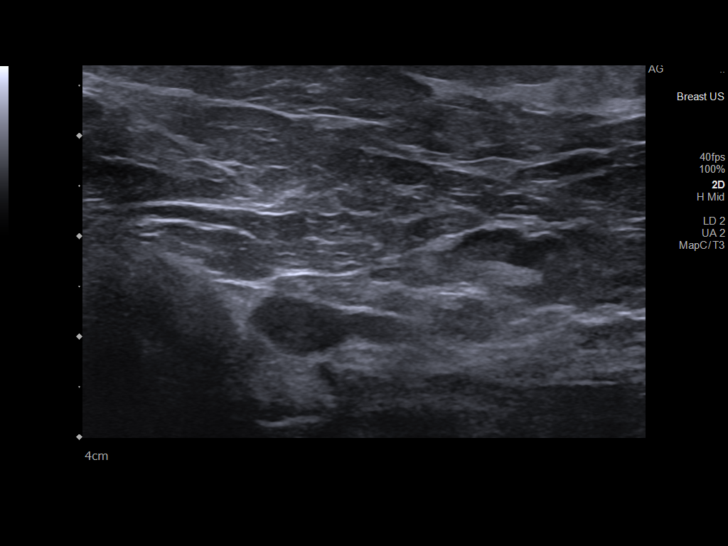

[13 of 20 positions shown; findings below may reference images not displayed]

ACR Breast Density Category c: The breast tissue is heterogeneously
dense, which may obscure small masses.
FINDINGS: There is an oval circumscribed mass in the upper central to slightly
outer right breast measuring approximately 0.6 cm. There are oval
masses with obscured margins within the upper-outer/outer left
breast. No retroareolar abnormalities identified mammographically to
explain bilateral nipple discharge. There is a possible prominent
lymph node in the left axilla.

Mammographic images were processed with CAD.

Targeted ultrasound of the retroareolar right breast was performed
with no suspicious masses or abnormality seen. No intraductal
masses. Numerous cysts and clusters of cysts are seen within the
superior and outer right breast. A cluster of cysts in the right
breast at the 11 to [DATE] position 6 cm from nipple measures 0.6 x
0.3 x 0.4 cm. This corresponds well with the mass seen in the
superior right breast at mammography.

Targeted ultrasound of the retroareolar left breast was performed
with no suspicious masses or abnormality seen. Numerous cysts and
clusters of cysts are seen within the upper and outer left breast.
No intraductal masses. A representative cluster of cysts at the 3
o'clock position 4 cm from nipple measures 0.7 x 0.4 x 0.5 cm. No
abnormal lymph nodes seen in the left axilla.
IMPRESSION: 1. No mammographic or sonographic abnormalities to explain bilateral
nipple discharge.

2.  No mammographic evidence of malignancy in either breast.

RECOMMENDATION:
1. Recommend further management of nipple discharge be based on
clinical assessment. This is felt to be physiologic/related to a
benign process given brownish color and bilaterality with symmetric
onset. If the patient experiences spontaneous unilateral bloody or
clear nipple discharge from a single duct only, then further
evaluation with breast MRI is recommended.

2. Screening mammogram at age 40 unless there are persistent or
intervening clinical concerns. (Code:PY-V-YQX)

I have discussed the findings and recommendations with the patient.
If applicable, a reminder letter will be sent to the patient
regarding the next appointment.

BI-RADS CATEGORY  2: Benign.

## 2021-05-16 ENCOUNTER — Other Ambulatory Visit: Payer: Self-pay

## 2021-05-16 ENCOUNTER — Ambulatory Visit (INDEPENDENT_AMBULATORY_CARE_PROVIDER_SITE_OTHER): Payer: Medicaid Other | Admitting: Adult Health

## 2021-05-16 ENCOUNTER — Other Ambulatory Visit (HOSPITAL_COMMUNITY)
Admission: RE | Admit: 2021-05-16 | Discharge: 2021-05-16 | Disposition: A | Discharge status: Home / Self Care | Payer: Medicaid Other | Source: Ambulatory Visit | Attending: Adult Health | Admitting: Adult Health

## 2021-05-16 ENCOUNTER — Encounter: Payer: Self-pay | Admitting: Adult Health

## 2021-05-16 VITALS — BP 132/83 | HR 97 | Ht 62.0 in | Wt 157.0 lb

## 2021-05-16 DIAGNOSIS — Z3201 Encounter for pregnancy test, result positive: Secondary | ICD-10-CM | POA: Insufficient documentation

## 2021-05-16 DIAGNOSIS — N898 Other specified noninflammatory disorders of vagina: Secondary | ICD-10-CM | POA: Diagnosis not present

## 2021-05-16 DIAGNOSIS — F319 Bipolar disorder, unspecified: Secondary | ICD-10-CM | POA: Insufficient documentation

## 2021-05-16 DIAGNOSIS — O3680X0 Pregnancy with inconclusive fetal viability, not applicable or unspecified: Secondary | ICD-10-CM | POA: Insufficient documentation

## 2021-05-16 DIAGNOSIS — Z3A01 Less than 8 weeks gestation of pregnancy: Secondary | ICD-10-CM | POA: Insufficient documentation

## 2021-05-16 LAB — POCT URINE PREGNANCY: Preg Test, Ur: POSITIVE — AB

## 2021-05-16 NOTE — Progress Notes (Signed)
Patient ID: Bonnie Weaver, female   DOB: 06/07/1986, 35 y.o.   MRN: [id number] History of Present Illness: Bonnie Weaver is a 35 year old white female,single, G3P1 in for UPT has missed a period. PCP is Dr Janeece Riggers, and she sees Theresa Mulligan at [facility],   Current Medications, Allergies, Past Medical History, Past Surgical History, Family History and Social History were reviewed in [employer] record.     Review of Systems:  +missed period +spotted after BM last week +vaginal discharge with some itching.     Physical Exam:BP 132/83 (BP Location: Right Arm, Patient Position: Sitting, Cuff Size: Normal)   Pulse 97   Ht 5\' 2"  (1.575 m)   Wt 157 lb (71.2 kg)   LMP 03/26/2021 (Approximate)   BMI 28.72 kg/m  UPT is +, about 7+2 weeks by LMP with EDD 12/31/21. General:  Well developed, well nourished, no acute distress Skin:  Warm and dry Neck:  Midline trachea, normal thyroid, good ROM, no lymphadenopathy Lungs; Clear to auscultation bilaterally Cardiovascular: Regular rate and rhythm Abdomen:  Soft, non tender, no hepatosplenomegaly Pelvic:  External genitalia is normal in appearance, no lesions.  The vagina is normal in appearance,has white discharge, without odor, CV swab obtained. Urethra has no lesions or masses. The cervix is bulbous.  Uterus is felt to be normal size, shape, and contour.  No adnexal masses or tenderness noted.Bladder is non tender, no masses felt. Extremities/musculoskeletal:  No swelling or varicosities noted, no clubbing or cyanosis Psych:  No mood changes, alert and cooperative,seems happy Fall risk is low Depression screen PHQ 2/9 05/16/2021  Decreased Interest 0  Down, Depressed, Hopeless 0  PHQ - 2 Score 0     Upstream - 05/16/21 1603       Pregnancy Intention Screening   Does the patient want to become pregnant in the next year? Yes    Does the patient's partner want to become pregnant in the next year? Yes     Would the patient like to discuss contraceptive options today? No      Contraception Wrap Up   Current Method Pregnant/Seeking Pregnancy    End Method Pregnant/Seeking Pregnancy    Contraception Counseling Provided No              Impression and Plan: 1. Pregnancy test positive Take PNV and folic acid  2. Vaginal discharge CV swab sent for GC/CHL,trich,BV and yeast  3. Less than [redacted] weeks gestation of pregnancy Review handout by [facility]   4. Encounter to determine fetal viability of pregnancy, single or unspecified fetus Return in about 3 weeks for dating 07/16/21  5. Bipolar 1 disorder (HCC) OK to continue suboxone,Lamictal,Pristiq and stop Abilify but only after seeing [place]. She  is not taking adderal or Neurontin now.  Discussed with Dr Theresa Mulligan, too.

## 2021-05-18 LAB — CERVICOVAGINAL ANCILLARY ONLY
Bacterial Vaginitis (gardnerella): NEGATIVE
Candida Glabrata: NEGATIVE
Candida Vaginitis: NEGATIVE
Chlamydia: NEGATIVE
Comment: NEGATIVE
Comment: NEGATIVE
Comment: NEGATIVE
Comment: NEGATIVE
Comment: NEGATIVE
Comment: NORMAL
Neisseria Gonorrhea: NEGATIVE
Trichomonas: NEGATIVE

## 2021-06-06 ENCOUNTER — Ambulatory Visit (INDEPENDENT_AMBULATORY_CARE_PROVIDER_SITE_OTHER): Payer: Medicaid Other

## 2021-06-06 ENCOUNTER — Other Ambulatory Visit: Payer: Self-pay

## 2021-06-06 DIAGNOSIS — O3680X0 Pregnancy with inconclusive fetal viability, not applicable or unspecified: Secondary | ICD-10-CM

## 2021-06-06 DIAGNOSIS — Z3A10 10 weeks gestation of pregnancy: Secondary | ICD-10-CM

## 2021-06-06 NOTE — Progress Notes (Signed)
[place] 10+2 wks,single IUP,FHR 164 bpm,CRL 32.27 mm,normal left ovary,simple right corpus luteal cyst 2.8 x 2.7 x 2.2 cm

## 2021-06-26 ENCOUNTER — Other Ambulatory Visit: Payer: Self-pay | Admitting: Obstetrics & Gynecology

## 2021-06-26 DIAGNOSIS — Z3682 Encounter for antenatal screening for nuchal translucency: Secondary | ICD-10-CM

## 2021-06-27 ENCOUNTER — Ambulatory Visit: Payer: Medicaid Other | Admitting: *Deleted

## 2021-06-27 ENCOUNTER — Ambulatory Visit (INDEPENDENT_AMBULATORY_CARE_PROVIDER_SITE_OTHER): Payer: Medicaid Other | Admitting: Women's Health

## 2021-06-27 ENCOUNTER — Other Ambulatory Visit: Payer: Self-pay

## 2021-06-27 ENCOUNTER — Encounter: Payer: Self-pay | Admitting: Women's Health

## 2021-06-27 ENCOUNTER — Ambulatory Visit (INDEPENDENT_AMBULATORY_CARE_PROVIDER_SITE_OTHER): Payer: Medicaid Other

## 2021-06-27 VITALS — BP 118/74 | HR 88 | Wt 165.0 lb

## 2021-06-27 DIAGNOSIS — O09299 Supervision of pregnancy with other poor reproductive or obstetric history, unspecified trimester: Secondary | ICD-10-CM

## 2021-06-27 DIAGNOSIS — O99320 Drug use complicating pregnancy, unspecified trimester: Secondary | ICD-10-CM

## 2021-06-27 DIAGNOSIS — F1120 Opioid dependence, uncomplicated: Secondary | ICD-10-CM | POA: Insufficient documentation

## 2021-06-27 DIAGNOSIS — Z98891 History of uterine scar from previous surgery: Secondary | ICD-10-CM | POA: Diagnosis not present

## 2021-06-27 DIAGNOSIS — O0990 Supervision of high risk pregnancy, unspecified, unspecified trimester: Secondary | ICD-10-CM | POA: Insufficient documentation

## 2021-06-27 DIAGNOSIS — Z3481 Encounter for supervision of other normal pregnancy, first trimester: Secondary | ICD-10-CM | POA: Diagnosis not present

## 2021-06-27 DIAGNOSIS — Z3682 Encounter for antenatal screening for nuchal translucency: Secondary | ICD-10-CM

## 2021-06-27 DIAGNOSIS — Z3480 Encounter for supervision of other normal pregnancy, unspecified trimester: Secondary | ICD-10-CM | POA: Diagnosis not present

## 2021-06-27 DIAGNOSIS — Z3A13 13 weeks gestation of pregnancy: Secondary | ICD-10-CM | POA: Diagnosis not present

## 2021-06-27 DIAGNOSIS — Z124 Encounter for screening for malignant neoplasm of cervix: Secondary | ICD-10-CM

## 2021-06-27 DIAGNOSIS — F99 Mental disorder, not otherwise specified: Secondary | ICD-10-CM | POA: Insufficient documentation

## 2021-06-27 DIAGNOSIS — Z3490 Encounter for supervision of normal pregnancy, unspecified, unspecified trimester: Secondary | ICD-10-CM | POA: Insufficient documentation

## 2021-06-27 DIAGNOSIS — F1111 Opioid abuse, in remission: Secondary | ICD-10-CM | POA: Insufficient documentation

## 2021-06-27 DIAGNOSIS — F17200 Nicotine dependence, unspecified, uncomplicated: Secondary | ICD-10-CM | POA: Insufficient documentation

## 2021-06-27 DIAGNOSIS — Z113 Encounter for screening for infections with a predominantly sexual mode of transmission: Secondary | ICD-10-CM

## 2021-06-27 LAB — POCT URINALYSIS DIPSTICK OB
Blood, UA: NEGATIVE
Glucose, UA: NEGATIVE
Ketones, UA: NEGATIVE
Leukocytes, UA: NEGATIVE
Nitrite, UA: NEGATIVE
POC,PROTEIN,UA: NEGATIVE

## 2021-06-27 MED ORDER — BLOOD PRESSURE MONITOR MISC: 0 refills | Status: DC

## 2021-06-27 MED ORDER — ASPIRIN 81 MG PO TBEC: 162.0000 mg | DELAYED_RELEASE_TABLET | Freq: Every day | ORAL | 2 refills | Status: DC

## 2021-06-27 MED ORDER — PROMETHAZINE HCL 25 MG PO TABS: 12.5000 mg | ORAL_TABLET | Freq: Four times a day (QID) | ORAL | 6 refills | Status: DC | PRN

## 2021-06-27 NOTE — Progress Notes (Signed)
INITIAL OBSTETRICAL VISIT Patient name: Bonnie Weaver MRN [id number]  Date of birth: 1986-05-10 Chief Complaint:   Initial Prenatal Visit (nausea)  History of Present Illness:   Bonnie Weaver is a 35 y.o. G77P1011 Caucasian female at [redacted]w[redacted]d by LMP c/w u/s at 10 weeks with an Estimated Date of Delivery: 12/31/21 being seen today for her initial obstetrical visit.   Patient's last menstrual period was 03/26/2021 (approximate). Her obstetrical history is significant for  Ab x 1, C/S for FTP @ 5-6cm at [facility] in 2008, developed pneumonia>ARDS>sepsis, on vent x 14d. Had pre-e . Wants RCS.  H/O opioid abuse, on methadone x 70yrs, switched to suboxone about ago, currently on 4mg  q 4-6hr prn, gets from Dr. , therapy there q 2wk.  H/O bipolar/dep/anx/ADHD- on pristiq, lamictal, adderall, trintellix, abilify- sees Cathey Endow at [facility] in [place] q [place], she has started weaning her meds, doing well w/ it.  Smoker 1ppd prior to pregnancy, now 5-7 cigarettes/day, and trying to quit completely on her own Today she reports fatigue and nausea.  Last pap 2-69yrs ago. Results were: negative per pt report at [facility]  Depression screen [facility] 2/9 06/27/2021 05/16/2021  Decreased Interest 0 0  Down, Depressed, Hopeless 0 0  PHQ - 2 Score 0 0  Altered sleeping 1 -  Tired, decreased energy 2 -  Change in appetite 0 -  Feeling bad or failure about yourself  0 -  Trouble concentrating 1 -  Moving slowly or fidgety/restless 0 -  Suicidal thoughts 0 -  PHQ-9 Score 4 -     GAD 7 : Generalized Anxiety Score 06/27/2021  Nervous, Anxious, on Edge 1  Control/stop worrying 1  Worry too much - different things 0  Trouble relaxing 0  Restless 0  Easily annoyed or irritable 2  Afraid - awful might happen 1  Total GAD 7 Score 5     Review of Systems:   Pertinent items are noted in HPI Denies cramping/contractions, leakage of fluid, vaginal bleeding, abnormal vaginal  discharge w/ itching/odor/irritation, headaches, visual changes, shortness of breath, chest pain, abdominal pain, severe nausea/vomiting, or problems with urination or bowel movements unless otherwise stated above.  Pertinent History Reviewed:  Reviewed past medical,surgical, social, obstetrical and family history.  Reviewed problem list, medications and allergies. OB History  Gravida Para Term Preterm AB Living  3 1 1   1 1   SAB IAB Ectopic Multiple Live Births          1    # Outcome Date GA Lbr Len/2nd Weight Sex Delivery Anes PTL Lv  3 Current           2 Term 06/13/07 [redacted]w[redacted]d  8 lb 1 oz (3.657 kg) F CS-LTranv EPI N LIV     Complications: Failure to Progress in First Stage, Gestational hypertension  1 AB            Physical Assessment:   Vitals:   06/27/21 1342  BP: 118/74  Pulse: 88  Weight: 165 lb (74.8 kg)  Body mass index is 30.18 kg/m.       Physical Examination:  General appearance - well appearing, and in no distress  Mental status - alert, oriented to person, place, and time  Psych:  She has a normal mood and affect  Skin - warm and dry, normal color, no suspicious lesions noted  Chest - effort normal, all lung fields clear to auscultation bilaterally  Heart - normal  rate and regular rhythm  Abdomen - soft, nontender  Extremities:  No swelling or varicosities noted  Thin prep pap is not done   Chaperone: N/A    TODAY'S NT   [place] 13+2 wks,measurements c/w dates,CRL 71.50 mm,FHR 153 bpm,NB present,NT 1.6 mm,posterior placenta,normal left ovary,simple right corpus luteal cyst 2.7 x 2.3 x 1.9 cm  Results for orders placed or performed in visit on 06/27/21 (from the past 24 hour(s))  POC Urinalysis Dipstick OB   Collection Time: 06/27/21  2:43 PM  Result Value Ref Range   Color, UA     Clarity, UA     Glucose, UA Negative Negative   Bilirubin, UA     Ketones, UA neg    Spec Grav, UA     Blood, UA neg    pH, UA     POC,PROTEIN,UA Negative Negative, Trace, Small  (1+), Moderate (2+), Large (3+), 4+   Urobilinogen, UA     Nitrite, UA neg    Leukocytes, UA Negative Negative   Appearance     Odor      Assessment & Plan:  1) Low-Risk Pregnancy G3P1011 at [redacted]w[redacted]d with an Estimated Date of Delivery: 12/31/21   2) Initial OB visit  3) Prev c/s> at [facility] in 2008, [place] @ 5-6cm, developed pneumonia>ARDS>sepsis, on vent x 14d, records requested. Wants RCS  4) H/O pre-e> start ASA 162mg , baseline labs today  5) H/O opioid abuse, now on suboxone> 4mg  q 4-6hr prn, Dr.  6) Bipolar/dep/anx/ADHD> on pristiq, lamictal, adderall, trintellix, abilify. Sees w/ Cathey Endow in [place] q [employer], she is weaning her meds. Doing well  7) Smoker> Smokes 5-7/day (down from 1ppd), counseled x 3-33mins, advised cessation, discussed risks to fetus while pregnant, to infant pp, and to herself. Offered QuitlineNC, declined  8) Nausea> rx phenergan  Meds:  Meds ordered this encounter  Medications   Blood Pressure Monitor MISC    Sig: For regular home bp monitoring during pregnancy    Dispense:  1 each    Refill:  0    Z34.81 Please mail to patient   aspirin 81 MG EC tablet    Sig: Take 2 tablets (162 mg total) by mouth daily. Swallow whole.    Dispense:  180 tablet    Refill:  2    Order Specific Question:   Supervising Provider    Answer:   H [[id number]]   promethazine (PHENERGAN) 25 MG tablet    Sig: Take 0.5-1 tablets (12.5-25 mg total) by mouth every 6 (six) hours as needed for nausea or vomiting.    Dispense:  30 tablet    Refill:  6    Order Specific Question:   Supervising Provider    Answer:   Duane Lope H [[id number]]    Initial labs obtained Continue prenatal vitamins Reviewed n/v relief measures and warning s/s to report Reviewed recommended weight gain based on pre-gravid BMI Encouraged well-balanced diet Genetic & carrier screening discussed: requests Panorama, NT/IT, and Horizon ,  Ultrasound discussed;  fetal survey: requested CCNC completed> form faxed if has or is planning to apply for medicaid The nature of  - [facility] with multiple MDs and other Advanced Practice Providers was explained to patient; also emphasized that fellows, residents, and students are part of our team. Does not have home bp cuff. Office bp cuff given: no. Rx sent: yes. Check bp weekly, let Duane Lope know if consistently >140/90.   Follow-up:  Return in about 3 weeks (around 07/18/2021) for LROB, 2nd IT, CNM, in person.   Orders Placed This Encounter  Procedures   Urine Culture   GC/Chlamydia Probe Amp   Integrated 1   Pain Management Screening Profile (10S)   Genetic Screening   CBC/D/Plt+RPR+Rh+ABO+RubIgG...   Protein / creatinine ratio, urine   Comprehensive metabolic panel   POC Urinalysis Dipstick OB    Cheral Marker CNM, [facility] 06/27/2021 3:14 PM

## 2021-06-27 NOTE — Patient Instructions (Signed)
Bonnie Weaver, thank you for choosing our office today! We appreciate the opportunity to meet your healthcare needs. You may receive a short survey by mail, e-mail, or through MyChart. If you are happy with your care we would appreciate if you could take just a few minutes to complete the survey questions. We read all of your comments and take your feedback very seriously. Thank you again for choosing our office.  [facility] ([address] , [place] [address]) Entrance C, located off of [address] Free 24/7 valet parking   Nausea & Vomiting Have saltine crackers or pretzels by your bed and eat a few bites before you raise your head out of bed in the morning Eat small frequent meals throughout the day instead of large meals Drink plenty of fluids throughout the day to stay hydrated, just don't drink a lot of fluids with your meals.  This can make your stomach fill up faster making you feel sick Do not brush your teeth right after you eat Products with real ginger are good for nausea, like ginger ale and ginger hard candy Make sure it says made with real ginger! Sucking on sour candy like lemon heads is also good for nausea If your prenatal vitamins make you nauseated, take them at night so you will sleep through the nausea Sea Bands If you feel like you need medicine for the nausea & vomiting please let us know If you are unable to keep any fluids or food down please let us know   Constipation Drink plenty of fluid, preferably water, throughout the day Eat foods high in fiber such as fruits, vegetables, and grains Exercise, such as walking, is a good way to keep your bowels regular Drink warm fluids, especially warm prune juice, or decaf coffee Eat a 1/2 cup of real oatmeal (not instant), 1/2 cup applesauce, and 1/2-1 cup warm prune juice every day If needed, you may take Colace (docusate sodium) stool softener  once or twice a day to help keep the stool soft.  If you still are having problems with constipation, you may take Miralax once daily as needed to help keep your bowels regular.   Home Blood Pressure Monitoring for Patients   Your provider has recommended that you check your blood pressure (BP) at least once a week at home. If you do not have a blood pressure cuff at home, one will be provided for you. Contact your provider if you have not received your monitor within 1 week.   Helpful Tips for Accurate Home Blood Pressure Checks  Don't smoke, exercise, or drink caffeine 30 minutes before checking your BP Use the restroom before checking your BP (a full bladder can raise your pressure) Relax in a comfortable upright chair Feet on the ground Left arm resting comfortably on a flat surface at the level of your heart Legs uncrossed Back supported Sit quietly and don't talk Place the cuff on your bare arm Adjust snuggly, so that only two fingertips can fit between your skin and the top of the cuff Check 2 readings separated by at least one minute Keep a log of your BP readings For a visual, please reference this diagram: http://ccnc.care/bpdiagram  Provider Name: [facility]     Phone: [phone]  Zone 1: ALL CLEAR  Continue to monitor your symptoms:  BP reading is less than 140 (top number) or less than 90 (bottom   number)  No right upper stomach pain No headaches or seeing spots No feeling nauseated or throwing up No swelling in face and hands  Zone 2: CAUTION Call your doctor's office for any of the following:  BP reading is greater than 140 (top number) or greater than 90 (bottom number)  Stomach pain under your ribs in the middle or right side Headaches or seeing spots Feeling nauseated or throwing up Swelling in face and hands  Zone 3: EMERGENCY  Seek immediate medical care if you have any of the following:  BP reading is greater than160 (top number) or greater than  110 (bottom number) Severe headaches not improving with Tylenol Serious difficulty catching your breath Any worsening symptoms from Zone 2    First Trimester of Pregnancy The first trimester of pregnancy is from week 1 until the end of week 12 (months 1 through 3). A week after a sperm fertilizes an egg, the egg will implant on the wall of the uterus. This embryo will begin to develop into a baby. Genes from you and your partner are forming the baby. The female genes determine whether the baby is a boy or a girl. At 6-8 weeks, the eyes and face are formed, and the heartbeat can be seen on ultrasound. At the end of 12 weeks, all the baby's organs are formed.  Now that you are pregnant, you will want to do everything you can to have a healthy baby. Two of the most important things are to get good prenatal care and to follow your health care provider's instructions. Prenatal care is all the medical care you receive before the baby's birth. This care will help prevent, find, and treat any problems during the pregnancy and childbirth. BODY CHANGES Your body goes through many changes during pregnancy. The changes vary from woman to woman.  You may gain or lose a couple of pounds at first. You may feel sick to your stomach (nauseous) and throw up (vomit). If the vomiting is uncontrollable, call your health care provider. You may tire easily. You may develop headaches that can be relieved by medicines approved by your health care provider. You may urinate more often. Painful urination may mean you have a bladder infection. You may develop heartburn as a result of your pregnancy. You may develop constipation because certain hormones are causing the muscles that push waste through your intestines to slow down. You may develop hemorrhoids or swollen, bulging veins (varicose veins). Your breasts may begin to grow larger and become tender. Your nipples may stick out more, and the tissue that surrounds them  (areola) may become darker. Your gums may bleed and may be sensitive to brushing and flossing. Dark spots or blotches (chloasma, mask of pregnancy) may develop on your face. This will likely fade after the baby is born. Your menstrual periods will stop. You may have a loss of appetite. You may develop cravings for certain kinds of food. You may have changes in your emotions from day to day, such as being excited to be pregnant or being concerned that something may go wrong with the pregnancy and baby. You may have more vivid and strange dreams. You may have changes in your hair. These can include thickening of your hair, rapid growth, and changes in texture. Some women also have hair loss during or after pregnancy, or hair that feels dry or thin. Your hair will most likely return to normal after your baby is born. WHAT TO EXPECT AT YOUR PRENATAL  VISITS During a routine prenatal visit: You will be weighed to make sure you and the baby are growing normally. Your blood pressure will be taken. Your abdomen will be measured to track your baby's growth. The fetal heartbeat will be listened to starting around week 10 or 12 of your pregnancy. Test results from any previous visits will be discussed. Your health care provider may ask you: How you are feeling. If you are feeling the baby move. If you have had any abnormal symptoms, such as leaking fluid, bleeding, severe headaches, or abdominal cramping. If you have any questions. Other tests that may be performed during your first trimester include: Blood tests to find your blood type and to check for the presence of any previous infections. They will also be used to check for low iron levels (anemia) and Rh antibodies. Later in the pregnancy, blood tests for diabetes will be done along with other tests if problems develop. Urine tests to check for infections, diabetes, or protein in the urine. An ultrasound to confirm the proper growth and development  of the baby. An amniocentesis to check for possible genetic problems. Fetal screens for spina bifida and Down syndrome. You may need other tests to make sure you and the baby are doing well. HOME CARE INSTRUCTIONS  Medicines Follow your health care provider's instructions regarding medicine use. Specific medicines may be either safe or unsafe to take during pregnancy. Take your prenatal vitamins as directed. If you develop constipation, try taking a stool softener if your health care provider approves. Diet Eat regular, well-balanced meals. Choose a variety of foods, such as meat or vegetable-based protein, fish, milk and low-fat dairy products, vegetables, fruits, and whole grain breads and cereals. Your health care provider will help you determine the amount of weight gain that is right for you. Avoid raw meat and uncooked cheese. These carry germs that can cause birth defects in the baby. Eating four or five small meals rather than three large meals a day may help relieve nausea and vomiting. If you start to feel nauseous, eating a few soda crackers can be helpful. Drinking liquids between meals instead of during meals also seems to help nausea and vomiting. If you develop constipation, eat more high-fiber foods, such as fresh vegetables or fruit and whole grains. Drink enough fluids to keep your urine clear or pale yellow. Activity and Exercise Exercise only as directed by your health care provider. Exercising will help you: Control your weight. Stay in shape. Be prepared for labor and delivery. Experiencing pain or cramping in the lower abdomen or low back is a good sign that you should stop exercising. Check with your health care provider before continuing normal exercises. Try to avoid standing for long periods of time. Move your legs often if you must stand in one place for a long time. Avoid heavy lifting. Wear low-heeled shoes, and practice good posture. You may continue to have sex  unless your health care provider directs you otherwise. Relief of Pain or Discomfort Wear a good support bra for breast tenderness.   Take warm sitz baths to soothe any pain or discomfort caused by hemorrhoids. Use hemorrhoid cream if your health care provider approves.   Rest with your legs elevated if you have leg cramps or low back pain. If you develop varicose veins in your legs, wear support hose. Elevate your feet for 15 minutes, 3-4 times a day. Limit salt in your diet. Prenatal Care Schedule your prenatal visits by the  twelfth week of pregnancy. They are usually scheduled monthly at first, then more often in the last 2 months before delivery. Write down your questions. Take them to your prenatal visits. Keep all your prenatal visits as directed by your health care provider. Safety Wear your seat belt at all times when driving. Make a list of emergency phone numbers, including numbers for family, friends, the hospital, and police and fire departments. General Tips Ask your health care provider for a referral to a local prenatal education class. Begin classes no later than at the beginning of month 6 of your pregnancy. Ask for help if you have counseling or nutritional needs during pregnancy. Your health care provider can offer advice or refer you to specialists for help with various needs. Do not use hot tubs, steam rooms, or saunas. Do not douche or use tampons or scented sanitary pads. Do not cross your legs for long periods of time. Avoid cat litter boxes and soil used by cats. These carry germs that can cause birth defects in the baby and possibly loss of the fetus by miscarriage or stillbirth. Avoid all smoking, herbs, alcohol, and medicines not prescribed by your health care provider. Chemicals in these affect the formation and growth of the baby. Schedule a dentist appointment. At home, brush your teeth with a soft toothbrush and be gentle when you floss. SEEK MEDICAL CARE IF:   You have dizziness. You have mild pelvic cramps, pelvic pressure, or nagging pain in the abdominal area. You have persistent nausea, vomiting, or diarrhea. You have a bad smelling vaginal discharge. You have pain with urination. You notice increased swelling in your face, hands, legs, or ankles. SEEK IMMEDIATE MEDICAL CARE IF:  You have a fever. You are leaking fluid from your vagina. You have spotting or bleeding from your vagina. You have severe abdominal cramping or pain. You have rapid weight gain or loss. You vomit blood or material that looks like coffee grounds. You are exposed to [place] measles and have never had them. You are exposed to fifth disease or chickenpox. You develop a severe headache. You have shortness of breath. You have any kind of trauma, such as from a fall or a car accident. Document Released: 09/25/2001 Document Revised: 02/15/2014 Document Reviewed: 08/11/2013 [facility] Patient Information 2015 [place], [place]. This information is not intended to replace advice given to you by your health care provider. Make sure you discuss any questions you have with your health care provider.

## 2021-06-27 NOTE — Progress Notes (Signed)
[place] 13+2 wks,measurements c/w dates,CRL 71.50 mm,FHR 153 bpm,NB present,NT 1.6 mm,posterior placenta,normal left ovary,simple right corpus luteal cyst 2.7 x 2.3 x 1.9 cm

## 2021-06-29 LAB — PMP SCREEN PROFILE (10S), URINE
Amphetamine Scrn, Ur: NEGATIVE ng/mL
BARBITURATE SCREEN URINE: NEGATIVE ng/mL
BENZODIAZEPINE SCREEN, URINE: NEGATIVE ng/mL
CANNABINOIDS UR QL SCN: NEGATIVE ng/mL
Cocaine (Metab) Scrn, Ur: NEGATIVE ng/mL
Creatinine(Crt), U: 144.5 mg/dL (ref 20.0–300.0)
Methadone Screen, Urine: NEGATIVE ng/mL
OXYCODONE+OXYMORPHONE UR QL SCN: NEGATIVE ng/mL
Opiate Scrn, Ur: NEGATIVE ng/mL
Ph of Urine: 7.4 (ref 4.5–8.9)
Phencyclidine Qn, Ur: NEGATIVE ng/mL
Propoxyphene Scrn, Ur: NEGATIVE ng/mL

## 2021-06-30 LAB — HCV RT-PCR, QUANT (NON-GRAPH): Hepatitis C Quantitation: NOT DETECTED IU/mL

## 2021-06-30 LAB — COMPREHENSIVE METABOLIC PANEL WITH GFR
ALT: 7 IU/L (ref 0–32)
AST: 7 IU/L (ref 0–40)
Albumin/Globulin Ratio: 1.7 (ref 1.2–2.2)
Albumin: 4 g/dL (ref 3.8–4.8)
Alkaline Phosphatase: 98 IU/L (ref 44–121)
BUN/Creatinine Ratio: 17 (ref 9–23)
BUN: 10 mg/dL (ref 6–20)
Bilirubin Total: <0.2 mg/dL (ref 0.0–1.2)
CO2: 20 mmol/L (ref 20–29)
Calcium: 9 mg/dL (ref 8.7–10.2)
Chloride: 103 mmol/L (ref 96–106)
Creatinine, Ser: 0.58 mg/dL (ref 0.57–1.00)
Globulin, Total: 2.3 g/dL (ref 1.5–4.5)
Glucose: 76 mg/dL (ref 65–99)
Potassium: 4.3 mmol/L (ref 3.5–5.2)
Sodium: 139 mmol/L (ref 134–144)
Total Protein: 6.3 g/dL (ref 6.0–8.5)
eGFR: 122 mL/min/1.73

## 2021-06-30 LAB — CBC/D/PLT+RPR+RH+ABO+RUBIGG...
Antibody Screen: NEGATIVE
Basophils Absolute: 0 10*3/uL (ref 0.0–0.2)
Basos: 0 %
EOS (ABSOLUTE): 0.2 10*3/uL (ref 0.0–0.4)
Eos: 2 %
HCV Ab: >11 s/co ratio — ABNORMAL HIGH (ref 0.0–0.9)
HIV Screen 4th Generation wRfx: NONREACTIVE
Hematocrit: 36.3 % (ref 34.0–46.6)
Hemoglobin: 12 g/dL (ref 11.1–15.9)
Hepatitis B Surface Ag: NEGATIVE
Immature Grans (Abs): 0 10*3/uL (ref 0.0–0.1)
Immature Granulocytes: 0 %
Lymphocytes Absolute: 3.6 10*3/uL — ABNORMAL HIGH (ref 0.7–3.1)
Lymphs: 32 %
MCH: 29.9 pg (ref 26.6–33.0)
MCHC: 33.1 g/dL (ref 31.5–35.7)
MCV: 90 fL (ref 79–97)
Monocytes Absolute: 0.5 10*3/uL (ref 0.1–0.9)
Monocytes: 5 %
Neutrophils Absolute: 6.8 10*3/uL (ref 1.4–7.0)
Neutrophils: 61 %
Platelets: 278 10*3/uL (ref 150–450)
RBC: 4.02 x10E6/uL (ref 3.77–5.28)
RDW: 13.1 % (ref 11.7–15.4)
RPR Ser Ql: NONREACTIVE
Rh Factor: POSITIVE
Rubella Antibodies, IGG: 11.7 index (ref >0.99)
WBC: 11.2 10*3/uL — ABNORMAL HIGH (ref 3.4–10.8)

## 2021-06-30 LAB — INTEGRATED 1
Crown Rump Length: 71.5 mm
Gest. Age on Collection Date: 13.1 wk
Maternal Age at EDD: 35.4 a
Nuchal Translucency (NT): 1.6 mm
Number of Fetuses: 1
PAPP-A Value: 830.5 ng/mL
Weight: 165 [lb_av]

## 2021-06-30 LAB — GC/CHLAMYDIA PROBE AMP
Chlamydia trachomatis, NAA: NEGATIVE
Neisseria Gonorrhoeae by PCR: NEGATIVE

## 2021-06-30 LAB — PROTEIN / CREATININE RATIO, URINE
Creatinine, Urine: 126.3 mg/dL
Protein, Ur: 10.4 mg/dL
Protein/Creat Ratio: 82 mg/g creat (ref 0–200)

## 2021-07-01 LAB — URINE CULTURE

## 2021-07-18 ENCOUNTER — Encounter: Payer: Self-pay | Admitting: Women's Health

## 2021-07-19 ENCOUNTER — Encounter: Payer: Self-pay | Admitting: Advanced Practice Midwife

## 2021-07-19 ENCOUNTER — Telehealth (INDEPENDENT_AMBULATORY_CARE_PROVIDER_SITE_OTHER): Payer: Medicaid Other | Admitting: Advanced Practice Midwife

## 2021-07-19 ENCOUNTER — Encounter: Payer: Self-pay | Admitting: Women's Health

## 2021-07-19 VITALS — BP 116/75 | HR 101 | Wt 168.0 lb

## 2021-07-19 DIAGNOSIS — Z3480 Encounter for supervision of other normal pregnancy, unspecified trimester: Secondary | ICD-10-CM

## 2021-07-19 DIAGNOSIS — F319 Bipolar disorder, unspecified: Secondary | ICD-10-CM

## 2021-07-19 DIAGNOSIS — O99342 Other mental disorders complicating pregnancy, second trimester: Secondary | ICD-10-CM

## 2021-07-19 DIAGNOSIS — O99612 Diseases of the digestive system complicating pregnancy, second trimester: Secondary | ICD-10-CM

## 2021-07-19 DIAGNOSIS — Z1379 Encounter for other screening for genetic and chromosomal anomalies: Secondary | ICD-10-CM

## 2021-07-19 DIAGNOSIS — Z363 Encounter for antenatal screening for malformations: Secondary | ICD-10-CM

## 2021-07-19 DIAGNOSIS — K5900 Constipation, unspecified: Secondary | ICD-10-CM

## 2021-07-19 DIAGNOSIS — F909 Attention-deficit hyperactivity disorder, unspecified type: Secondary | ICD-10-CM

## 2021-07-19 DIAGNOSIS — O09292 Supervision of pregnancy with other poor reproductive or obstetric history, second trimester: Secondary | ICD-10-CM

## 2021-07-19 DIAGNOSIS — O34219 Maternal care for unspecified type scar from previous cesarean delivery: Secondary | ICD-10-CM

## 2021-07-19 DIAGNOSIS — Z3A16 16 weeks gestation of pregnancy: Secondary | ICD-10-CM

## 2021-07-19 NOTE — Progress Notes (Addendum)
TELEHEALTH VIRTUAL OBSTETRICS VISIT ENCOUNTER NOTE Patient name: Bonnie Weaver MRN [id number]  Date of birth: 09-29-1986  I connected with patient on 07/19/21 at  3:30 PM EDT by MyChart and verified that I am speaking with the correct person using two identifiers. Due to COVID-19 recommendations, pt is not currently in our office (she is in her car), however the provider is in the office.   I discussed the limitations, risks, security and privacy concerns of performing an evaluation and management service by telephone and the availability of in person appointments. I also discussed with the patient that there may be a patient responsible charge related to this service. The patient expressed understanding and agreed to proceed.  Chief Complaint:   Routine Prenatal Visit  History of Present Illness:   Bonnie Weaver is a 35 y.o. G32P1011 female at [redacted]w[redacted]d with an Estimated Date of Delivery: 12/31/21 being evaluated today for ongoing management of a low-risk pregnancy.  Depression screen [facility] 2/9 06/27/2021 05/16/2021  Decreased Interest 0 0  Down, Depressed, Hopeless 0 0  PHQ - 2 Score 0 0  Altered sleeping 1 -  Tired, decreased energy 2 -  Change in appetite 0 -  Feeling bad or failure about yourself  0 -  Trouble concentrating 1 -  Moving slowly or fidgety/restless 0 -  Suicidal thoughts 0 -  PHQ-9 Score 4 -    Today she reports  recent constipation . Contractions: Not present. Vag. Bleeding: None.  Movement: Present. denies leaking of fluid. Review of Systems:   Pertinent items are noted in HPI Denies abnormal vaginal discharge w/ itching/odor/irritation, headaches, visual changes, shortness of breath, chest pain, abdominal pain, severe nausea/vomiting, or problems with urination or bowel movements unless otherwise stated above. Pertinent History Reviewed:  Reviewed past medical,surgical, social, obstetrical and family history.  Reviewed problem list, medications and  allergies. Physical Assessment:   Vitals:   07/19/21 1536  BP: 116/75  Pulse: (!) 101  Weight: 168 lb (76.2 kg)  Body mass index is 30.73 kg/m.        Physical Examination:   General:  Alert, oriented and cooperative.   Mental Status: Normal mood and affect perceived. Normal judgment and thought content.  Rest of physical exam deferred due to type of encounter  No results found for this or any previous visit (from the past 24 hour(s)).  Assessment & Plan:  1) Pregnancy G3P1011 at [redacted]w[redacted]d with an Estimated Date of Delivery: 12/31/21   2) Constipation, start daily stool softener; Miralax prn  3) Prev C/S (FTP/chorio/single-layer closure), wants rLTCS  4) Hx pre-e, baseline labs nl; taking bASA  5) On MAT, Suboxone from Dr Cathey Endow; needs NAS info  6) Multiple meds for bipolar/ADHD   Meds: No orders of the defined types were placed in this encounter.   Labs/procedures today: is coming for 2nd IT this week; checking to see if Pap records were requested already (msg to [place])  Plan:  Continue routine obstetrical care.  Didn't ask about home bp cuff.  Check bp weekly, let us know if >140/90.  Next visit: prefers will be in person for anatomy u/s     Reviewed: Preterm labor symptoms and general obstetric precautions including but not limited to vaginal bleeding, contractions, leaking of fluid and fetal movement were reviewed in detail with the patient. The patient was advised to call back or seek an in-person office evaluation/go to [facility] at [facility] for any urgent or concerning symptoms. All  questions were answered. Please refer to After Visit Summary for other counseling recommendations.    I provided 10 minutes of non-face-to-face time during this encounter.  Follow-up: Return in about 4 weeks (around 08/16/2021) for LROB, [place]: Anatomy, in person.  Orders Placed This Encounter  Procedures   US OB Comp + 14 Wk   INTEGRATED 2   Arabella Merles [facility] 07/19/2021 4:09  PM

## 2021-07-21 ENCOUNTER — Telehealth: Payer: Self-pay

## 2021-07-21 NOTE — Telephone Encounter (Signed)
Pt called stating that she was having some light bleeding since she woke around 1100. She described it as bright red with minimal mild intermittent cramping. She admits to having sex within the past 24 hours and she had also been constipated. She has been taking Miralax and finally had a bowel movement yesterday, which she denies straining with. Her BP was 110/75 this morning. She was instructed to monitor the bleeding and if it got worse or didn't stop, or if she had any unexplained pain, she was to go to MAU. Pt confirmed understanding. Cyril Mourning, NP confirmed advice.

## 2021-07-25 ENCOUNTER — Telehealth: Payer: Self-pay | Admitting: Women's Health

## 2021-07-25 NOTE — Telephone Encounter (Signed)
Pt seen at [facility] over the weekend for vaginal bleeding. She was treated for UTI and told that she needed to follow up with [place] asap. Will have provider review chart to let us know when patient needs to be seen.

## 2021-07-25 NOTE — Telephone Encounter (Signed)
Pt was seen 07/22/2021 @ [facility] ED - they recommended pt be seen here 07/24/2021  Pt went to ED due to having some bleeding, pt states there were some abnormal labs   Please advise & notify pt

## 2021-07-25 NOTE — Telephone Encounter (Signed)
I have reviewed the chart and noted and if no further active or bright red bleeding then she can make sure she takes her antibiotic and keep her scheduled appointment

## 2021-08-10 ENCOUNTER — Telehealth: Payer: Self-pay | Admitting: Women's Health

## 2021-08-10 NOTE — Telephone Encounter (Signed)
Patient went to doctor today and she needs to get her gallbladder out. She was told to call her ob doctor and get their opinion about it. She has preop tomorrow.

## 2021-08-10 NOTE — Telephone Encounter (Signed)
Called patient back- reviewed that while there are risk to surgery in pregnancy, if surgery is indicated 2nd trimester would be the best time to proceed.  Reviewed that while there is potential risk such as preterm labor.  She will need to weigh the risk vs benefit.  It is likely that her symptoms will continue to worsen throughout the pregnancy.  Plan to check FHT before and after surgery and pt should f/u at her scheduled appt on 11/9.  Myna Hidalgo, DO Attending Obstetrician & Gynecologist, [facility] for [employer], [facility]

## 2021-08-14 ENCOUNTER — Encounter: Payer: Self-pay | Admitting: Obstetrics & Gynecology

## 2021-08-14 ENCOUNTER — Ambulatory Visit (INDEPENDENT_AMBULATORY_CARE_PROVIDER_SITE_OTHER): Payer: Medicaid Other | Admitting: Obstetrics & Gynecology

## 2021-08-14 ENCOUNTER — Other Ambulatory Visit: Payer: Self-pay

## 2021-08-14 VITALS — BP 112/78 | HR 102 | Wt 167.0 lb

## 2021-08-14 DIAGNOSIS — K8020 Calculus of gallbladder without cholecystitis without obstruction: Secondary | ICD-10-CM

## 2021-08-14 DIAGNOSIS — Z3480 Encounter for supervision of other normal pregnancy, unspecified trimester: Secondary | ICD-10-CM

## 2021-08-14 NOTE — Progress Notes (Signed)
   LOW-RISK PREGNANCY VISIT Patient name: Bonnie Weaver MRN [id number]  Date of birth: 03/10/1986 Chief Complaint:   Routine Prenatal Visit  History of Present Illness:   Bonnie Weaver is a 35 y.o. G86P1011 female at [redacted]w[redacted]d with an Estimated Date of Delivery: 12/31/21 being seen today for ongoing management of a low-risk pregnancy.  Depression screen [facility] 2/9 06/27/2021 05/16/2021  Decreased Interest 0 0  Down, Depressed, Hopeless 0 0  PHQ - 2 Score 0 0  Altered sleeping 1 -  Tired, decreased energy 2 -  Change in appetite 0 -  Feeling bad or failure about yourself  0 -  Trouble concentrating 1 -  Moving slowly or fidgety/restless 0 -  Suicidal thoughts 0 -  PHQ-9 Score 4 -    Today she reports no complaints. Contractions: Not present. Vag. Bleeding: None.  Movement: Present. denies leaking of fluid. Review of Systems:   Pertinent items are noted in HPI Denies abnormal vaginal discharge w/ itching/odor/irritation, headaches, visual changes, shortness of breath, chest pain, abdominal pain, severe nausea/vomiting, or problems with urination or bowel movements unless otherwise stated above. Pertinent History Reviewed:  Reviewed past medical,surgical, social, obstetrical and family history.  Reviewed problem list, medications and allergies. Physical Assessment:   Vitals:   08/14/21 1355  BP: 112/78  Pulse: (!) 102  Weight: 167 lb (75.8 kg)  Body mass index is 30.54 kg/m.        Physical Examination:   General appearance: Well appearing, and in no distress  Mental status: Alert, oriented to person, place, and time  Skin: Warm & dry  Cardiovascular: Normal heart rate noted  Respiratory: Normal respiratory effort, no distress  Abdomen: Soft, gravid, nontender  Pelvic: Cervical exam deferred         Extremities: Edema: None  Fetal Status: Fetal Heart Rate (bpm): 145 Fundal Height: 20 cm Movement: Present    Chaperone: n/a    No results found for this or any previous visit  (from the past 24 hour(s)).  Assessment & Plan:  1) Low-risk pregnancy G3P1011 at [redacted]w[redacted]d with an Estimated Date of Delivery: 12/31/21   2) Gallbladder disease scheduled to see Dr Lovell Sheehan tomorrow,    Meds: No orders of the defined types were placed in this encounter.  Labs/procedures today:   Plan:  Continue routine obstetrical care  Next visit: prefers in person    Reviewed: Preterm labor symptoms and general obstetric precautions including but not limited to vaginal bleeding, contractions, leaking of fluid and fetal movement were reviewed in detail with the patient.  All questions were answered.  home bp cuff. Rx faxed to . Check bp weekly, let us know if >140/90.   Follow-up: Return for keep scheduled.  No orders of the defined types were placed in this encounter.   Lazaro Arms, MD 08/14/2021 2:41 PM

## 2021-08-15 ENCOUNTER — Ambulatory Visit (INDEPENDENT_AMBULATORY_CARE_PROVIDER_SITE_OTHER): Payer: Medicaid Other | Admitting: General Surgery

## 2021-08-15 ENCOUNTER — Encounter: Payer: Self-pay | Admitting: General Surgery

## 2021-08-15 VITALS — BP 102/67 | HR 98 | Temp 98.3°F | Resp 16 | Ht 62.0 in | Wt 165.0 lb

## 2021-08-15 DIAGNOSIS — K8020 Calculus of gallbladder without cholecystitis without obstruction: Secondary | ICD-10-CM | POA: Diagnosis not present

## 2021-08-16 ENCOUNTER — Encounter (HOSPITAL_COMMUNITY): Payer: Self-pay

## 2021-08-16 ENCOUNTER — Other Ambulatory Visit: Payer: Self-pay

## 2021-08-16 ENCOUNTER — Encounter (HOSPITAL_COMMUNITY)
Admission: RE | Admit: 2021-08-16 | Discharge: 2021-08-16 | Disposition: A | Discharge status: Home / Self Care | Payer: Medicaid Other | Source: Ambulatory Visit | Attending: General Surgery | Admitting: General Surgery

## 2021-08-16 HISTORY — DX: Unspecified asthma, uncomplicated: J45.909

## 2021-08-16 HISTORY — DX: Personal history of urinary calculi: Z87.442

## 2021-08-16 NOTE — Progress Notes (Signed)
Bonnie Weaver; [id number]; 16-Jun-1986   HPI Patient is a 35 year old white female who was referred to my care by Dr. Despina Hidden for evaluation treatment of cholelithiasis.  Patient is [redacted] weeks pregnant.  She has been having ongoing nausea and vomiting.  Ultrasound of the gallbladder reveals cholelithiasis with no choledocholithiasis.  Her oral intake has been variable and is felt that she needs her gallbladder out to maintain her nutrition.  She denies any fever, chills, or jaundice.  This has been going on for several weeks.  She has had intermittent abdominal pain in the right upper quadrant. Past Medical History:  Diagnosis Date   Anxiety    Bipolar 1 disorder (HCC)    Kidney stone     Past Surgical History:  Procedure Laterality Date   CESAREAN SECTION     LITHOTRIPSY      Family History  Problem Relation Age of Onset   Other Mother        sepsis   Hypertension Father    Autoimmune disease Father    Sleep apnea Father    Asthma Sister    Diabetes Sister    Anxiety disorder Daughter    Healthy Daughter    Diabetes Maternal Grandmother    Lung cancer Maternal Grandfather    Rheum arthritis Paternal Grandmother     Current Outpatient Medications on File Prior to Visit  Medication Sig Dispense Refill   acyclovir (ZOVIRAX) 800 MG tablet as needed.     albuterol (VENTOLIN HFA) 108 (90 Base) MCG/ACT inhaler as needed.     amphetamine-dextroamphetamine (ADDERALL) 20 MG tablet 5 mg as needed.     ARIPiprazole (ABILIFY) 15 MG tablet Take 15 mg by mouth daily. Patient taking 10mg      aspirin 81 MG EC tablet Take 2 tablets (162 mg total) by mouth daily. Swallow whole. 180 tablet 2   Blood Pressure Monitor MISC For regular home bp monitoring during pregnancy 1 each 0   ferrous sulfate 325 (65 FE) MG tablet Take by mouth.     hydrOXYzine (ATARAX/VISTARIL) 25 MG tablet Take 25 mg by mouth 2 (two) times daily.     lamoTRIgine (LAMICTAL) 100 MG tablet Take 100 mg by mouth 2 (two) times  daily. Patient taking 150mg      omeprazole (PRILOSEC) 20 MG capsule 1 capsule PRN     Polyethylene Glycol 3350 (PEG 3350) 17 g PACK Take 1 packet by mouth daily.     potassium chloride 20 MEQ/100ML IVPB 1 tablet with food     Prenatal Vit-Fe Fumarate-FA (PRENATAL VITAMIN PO) Take by mouth.     promethazine (PHENERGAN) 25 MG tablet Take 0.5-1 tablets (12.5-25 mg total) by mouth every 6 (six) hours as needed for nausea or vomiting. 30 tablet 6   SUBOXONE 8-2 MG FILM Place under the tongue 2 (two) times daily.     TRINTELLIX 10 MG TABS tablet Take 10 mg by mouth daily.     gabapentin (NEURONTIN) 100 MG capsule  (Patient not taking: No sig reported)     No current facility-administered medications on file prior to visit.    Allergies  Allergen Reactions   Codeine Rash   Sulfa Antibiotics     Social History   Substance and Sexual Activity  Alcohol Use No    Social History   Tobacco Use  Smoking Status Every Day   Packs/day: 0.25   Years: 15.00   Pack years: 3.75   Types: Cigarettes  Smokeless Tobacco Never  Review of Systems  Constitutional: Negative.   HENT: Negative.    Eyes: Negative.   Respiratory: Negative.    Cardiovascular: Negative.   Gastrointestinal:  Positive for abdominal pain and nausea.  Genitourinary: Negative.   Musculoskeletal: Negative.   Skin: Negative.   Neurological: Negative.   Endo/Heme/Allergies: Negative.   Psychiatric/Behavioral: Negative.     Objective   Vitals:   08/15/21 1357  BP: 102/67  Pulse: 98  Resp: 16  Temp: 98.3 F (36.8 C)  SpO2: 97%    Physical Exam Vitals reviewed.  Constitutional:      Appearance: Normal appearance. She is not ill-appearing.  HENT:     Head: Normocephalic and atraumatic.  Eyes:     General: No scleral icterus. Cardiovascular:     Rate and Rhythm: Normal rate and regular rhythm.     Heart sounds: Normal heart sounds. No murmur heard.   No friction rub. No gallop.  Pulmonary:     Effort:  Pulmonary effort is normal. No respiratory distress.     Breath sounds: Normal breath sounds. No stridor. No wheezing, rhonchi or rales.  Abdominal:     General: Bowel sounds are normal. There is no distension.     Palpations: Abdomen is soft. There is no mass.     Tenderness: There is no abdominal tenderness. There is no guarding or rebound.     Hernia: No hernia is present.     Comments: Fundal height appears to be just below the umbilicus.  Skin:    General: Skin is warm and dry.  Neurological:     Mental Status: She is alert and oriented to person, place, and time.  GYN notes reviewed.  Assessment  Biliary colic, cholelithiasis, 20 weeks intrauterine pregnancy Plan  Patient is scheduled for laparoscopic cholecystectomy on 08/18/2021.  The risks and benefits of the procedure including bleeding, infection, hepatobiliary injury, the possibility of an open procedure, and the possibility of a miscarriage were fully explained to the patient, who gave informed consent.  Dr. Despina Hidden will be available for backup should any issues arise.

## 2021-08-16 NOTE — H&P (View-Only) (Signed)
Bonnie Weaver; [id number]; 02-14-1986   HPI Patient is a 35 year old white female who was referred to my care by Dr. Despina Hidden for evaluation treatment of cholelithiasis.  Patient is [redacted] weeks pregnant.  She has been having ongoing nausea and vomiting.  Ultrasound of the gallbladder reveals cholelithiasis with no choledocholithiasis.  Her oral intake has been variable and is felt that she needs her gallbladder out to maintain her nutrition.  She denies any fever, chills, or jaundice.  This has been going on for several weeks.  She has had intermittent abdominal pain in the right upper quadrant. Past Medical History:  Diagnosis Date   Anxiety    Bipolar 1 disorder (HCC)    Kidney stone     Past Surgical History:  Procedure Laterality Date   CESAREAN SECTION     LITHOTRIPSY      Family History  Problem Relation Age of Onset   Other Mother        sepsis   Hypertension Father    Autoimmune disease Father    Sleep apnea Father    Asthma Sister    Diabetes Sister    Anxiety disorder Daughter    Healthy Daughter    Diabetes Maternal Grandmother    Lung cancer Maternal Grandfather    Rheum arthritis Paternal Grandmother     Current Outpatient Medications on File Prior to Visit  Medication Sig Dispense Refill   acyclovir (ZOVIRAX) 800 MG tablet as needed.     albuterol (VENTOLIN HFA) 108 (90 Base) MCG/ACT inhaler as needed.     amphetamine-dextroamphetamine (ADDERALL) 20 MG tablet 5 mg as needed.     ARIPiprazole (ABILIFY) 15 MG tablet Take 15 mg by mouth daily. Patient taking 10mg      aspirin 81 MG EC tablet Take 2 tablets (162 mg total) by mouth daily. Swallow whole. 180 tablet 2   Blood Pressure Monitor MISC For regular home bp monitoring during pregnancy 1 each 0   ferrous sulfate 325 (65 FE) MG tablet Take by mouth.     hydrOXYzine (ATARAX/VISTARIL) 25 MG tablet Take 25 mg by mouth 2 (two) times daily.     lamoTRIgine (LAMICTAL) 100 MG tablet Take 100 mg by mouth 2 (two) times  daily. Patient taking 150mg      omeprazole (PRILOSEC) 20 MG capsule 1 capsule PRN     Polyethylene Glycol 3350 (PEG 3350) 17 g PACK Take 1 packet by mouth daily.     potassium chloride 20 MEQ/100ML IVPB 1 tablet with food     Prenatal Vit-Fe Fumarate-FA (PRENATAL VITAMIN PO) Take by mouth.     promethazine (PHENERGAN) 25 MG tablet Take 0.5-1 tablets (12.5-25 mg total) by mouth every 6 (six) hours as needed for nausea or vomiting. 30 tablet 6   SUBOXONE 8-2 MG FILM Place under the tongue 2 (two) times daily.     TRINTELLIX 10 MG TABS tablet Take 10 mg by mouth daily.     gabapentin (NEURONTIN) 100 MG capsule  (Patient not taking: No sig reported)     No current facility-administered medications on file prior to visit.    Allergies  Allergen Reactions   Codeine Rash   Sulfa Antibiotics     Social History   Substance and Sexual Activity  Alcohol Use No    Social History   Tobacco Use  Smoking Status Every Day   Packs/day: 0.25   Years: 15.00   Pack years: 3.75   Types: Cigarettes  Smokeless Tobacco Never  Review of Systems  Constitutional: Negative.   HENT: Negative.    Eyes: Negative.   Respiratory: Negative.    Cardiovascular: Negative.   Gastrointestinal:  Positive for abdominal pain and nausea.  Genitourinary: Negative.   Musculoskeletal: Negative.   Skin: Negative.   Neurological: Negative.   Endo/Heme/Allergies: Negative.   Psychiatric/Behavioral: Negative.     Objective   Vitals:   08/15/21 1357  BP: 102/67  Pulse: 98  Resp: 16  Temp: 98.3 F (36.8 C)  SpO2: 97%    Physical Exam Vitals reviewed.  Constitutional:      Appearance: Normal appearance. She is not ill-appearing.  HENT:     Head: Normocephalic and atraumatic.  Eyes:     General: No scleral icterus. Cardiovascular:     Rate and Rhythm: Normal rate and regular rhythm.     Heart sounds: Normal heart sounds. No murmur heard.   No friction rub. No gallop.  Pulmonary:     Effort:  Pulmonary effort is normal. No respiratory distress.     Breath sounds: Normal breath sounds. No stridor. No wheezing, rhonchi or rales.  Abdominal:     General: Bowel sounds are normal. There is no distension.     Palpations: Abdomen is soft. There is no mass.     Tenderness: There is no abdominal tenderness. There is no guarding or rebound.     Hernia: No hernia is present.     Comments: Fundal height appears to be just below the umbilicus.  Skin:    General: Skin is warm and dry.  Neurological:     Mental Status: She is alert and oriented to person, place, and time.  GYN notes reviewed.  Assessment  Biliary colic, cholelithiasis, 20 weeks intrauterine pregnancy Plan  Patient is scheduled for laparoscopic cholecystectomy on 08/18/2021.  The risks and benefits of the procedure including bleeding, infection, hepatobiliary injury, the possibility of an open procedure, and the possibility of a miscarriage were fully explained to the patient, who gave informed consent.  Dr. Despina Hidden will be available for backup should any issues arise.

## 2021-08-18 ENCOUNTER — Encounter (HOSPITAL_COMMUNITY)
Admission: RE | Disposition: A | Discharge status: Home / Self Care | Payer: Self-pay | Source: Home / Self Care | Attending: General Surgery

## 2021-08-18 ENCOUNTER — Encounter (HOSPITAL_COMMUNITY): Payer: Self-pay | Admitting: General Surgery

## 2021-08-18 ENCOUNTER — Ambulatory Visit (HOSPITAL_COMMUNITY)
Admission: RE | Admit: 2021-08-18 | Discharge: 2021-08-18 | Disposition: A | Discharge status: Home / Self Care | Payer: Medicaid Other | Attending: General Surgery | Admitting: General Surgery

## 2021-08-18 ENCOUNTER — Ambulatory Visit (HOSPITAL_COMMUNITY): Payer: Medicaid Other | Admitting: Certified Registered"

## 2021-08-18 DIAGNOSIS — Z885 Allergy status to narcotic agent status: Secondary | ICD-10-CM | POA: Insufficient documentation

## 2021-08-18 DIAGNOSIS — Z3A20 20 weeks gestation of pregnancy: Secondary | ICD-10-CM | POA: Insufficient documentation

## 2021-08-18 DIAGNOSIS — K8020 Calculus of gallbladder without cholecystitis without obstruction: Secondary | ICD-10-CM | POA: Diagnosis not present

## 2021-08-18 DIAGNOSIS — Z98891 History of uterine scar from previous surgery: Secondary | ICD-10-CM | POA: Diagnosis not present

## 2021-08-18 DIAGNOSIS — Z882 Allergy status to sulfonamides status: Secondary | ICD-10-CM | POA: Diagnosis not present

## 2021-08-18 DIAGNOSIS — K8010 Calculus of gallbladder with chronic cholecystitis without obstruction: Secondary | ICD-10-CM | POA: Insufficient documentation

## 2021-08-18 DIAGNOSIS — F17210 Nicotine dependence, cigarettes, uncomplicated: Secondary | ICD-10-CM | POA: Insufficient documentation

## 2021-08-18 DIAGNOSIS — Z79899 Other long term (current) drug therapy: Secondary | ICD-10-CM | POA: Insufficient documentation

## 2021-08-18 DIAGNOSIS — Z7982 Long term (current) use of aspirin: Secondary | ICD-10-CM | POA: Insufficient documentation

## 2021-08-18 DIAGNOSIS — O26612 Liver and biliary tract disorders in pregnancy, second trimester: Secondary | ICD-10-CM | POA: Insufficient documentation

## 2021-08-18 DIAGNOSIS — O99612 Diseases of the digestive system complicating pregnancy, second trimester: Secondary | ICD-10-CM | POA: Diagnosis present

## 2021-08-18 HISTORY — PX: CHOLECYSTECTOMY: SHX55

## 2021-08-18 SURGERY — LAPAROSCOPIC CHOLECYSTECTOMY
Anesthesia: General | Site: Abdomen

## 2021-08-18 MED ORDER — FENTANYL CITRATE (PF) 250 MCG/5ML IJ SOLN
INTRAMUSCULAR | Status: AC
  Filled 2021-08-18: qty 5

## 2021-08-18 MED ORDER — BUPIVACAINE HCL (PF) 0.5 % IJ SOLN
INTRAMUSCULAR | Status: DC | PRN
  Administered 2021-08-18: 10 mL

## 2021-08-18 MED ORDER — BUPIVACAINE HCL (PF) 0.5 % IJ SOLN
INTRAMUSCULAR | Status: AC
  Filled 2021-08-18: qty 30

## 2021-08-18 MED ORDER — KETAMINE HCL 50 MG/5ML IJ SOSY
PREFILLED_SYRINGE | INTRAMUSCULAR | Status: AC
  Filled 2021-08-18: qty 5

## 2021-08-18 MED ORDER — GLYCOPYRROLATE PF 0.2 MG/ML IJ SOSY
PREFILLED_SYRINGE | INTRAMUSCULAR | Status: AC
  Filled 2021-08-18: qty 1

## 2021-08-18 MED ORDER — ONDANSETRON HCL 4 MG/2ML IJ SOLN
4.0000 mg | Freq: Once | INTRAMUSCULAR | Status: AC | PRN
  Administered 2021-08-18: 4 mg via INTRAVENOUS
  Filled 2021-08-18: qty 2

## 2021-08-18 MED ORDER — EPHEDRINE SULFATE 50 MG/ML IJ SOLN
INTRAMUSCULAR | Status: DC | PRN
  Administered 2021-08-18: 5 mg via INTRAVENOUS

## 2021-08-18 MED ORDER — DEXAMETHASONE SODIUM PHOSPHATE 10 MG/ML IJ SOLN
INTRAMUSCULAR | Status: AC
  Filled 2021-08-18: qty 1

## 2021-08-18 MED ORDER — ACETAMINOPHEN 10 MG/ML IV SOLN
INTRAVENOUS | Status: AC
  Filled 2021-08-18: qty 100

## 2021-08-18 MED ORDER — ONDANSETRON HCL 4 MG/2ML IJ SOLN
INTRAMUSCULAR | Status: DC | PRN
  Administered 2021-08-18: 4 mg via INTRAVENOUS

## 2021-08-18 MED ORDER — PROPOFOL 10 MG/ML IV BOLUS
INTRAVENOUS | Status: DC | PRN
  Administered 2021-08-18: 200 mg via INTRAVENOUS

## 2021-08-18 MED ORDER — CHLORHEXIDINE GLUCONATE 0.12 % MT SOLN
15.0000 mL | Freq: Once | OROMUCOSAL | Status: AC
  Administered 2021-08-18: 15 mL via OROMUCOSAL

## 2021-08-18 MED ORDER — PROPOFOL 10 MG/ML IV BOLUS
INTRAVENOUS | Status: AC
  Filled 2021-08-18: qty 20

## 2021-08-18 MED ORDER — SODIUM CHLORIDE 0.9 % IR SOLN
Status: DC | PRN
  Administered 2021-08-18: 1000 mL

## 2021-08-18 MED ORDER — EPHEDRINE 5 MG/ML INJ
INTRAVENOUS | Status: AC
  Filled 2021-08-18: qty 5

## 2021-08-18 MED ORDER — HEMOSTATIC AGENTS (NO CHARGE) OPTIME
TOPICAL | Status: DC | PRN
  Administered 2021-08-18 (×2): 1

## 2021-08-18 MED ORDER — SUGAMMADEX SODIUM 500 MG/5ML IV SOLN
INTRAVENOUS | Status: DC | PRN
  Administered 2021-08-18: 200 mg via INTRAVENOUS

## 2021-08-18 MED ORDER — KETAMINE HCL 10 MG/ML IJ SOLN
INTRAMUSCULAR | Status: DC | PRN
  Administered 2021-08-18: 30 mg via INTRAVENOUS

## 2021-08-18 MED ORDER — ACETAMINOPHEN 10 MG/ML IV SOLN
1000.0000 mg | Freq: Once | INTRAVENOUS | Status: AC
  Administered 2021-08-18: 1000 mg via INTRAVENOUS

## 2021-08-18 MED ORDER — ONDANSETRON HCL 4 MG/2ML IJ SOLN
INTRAMUSCULAR | Status: AC
  Filled 2021-08-18: qty 2

## 2021-08-18 MED ORDER — DEXAMETHASONE SODIUM PHOSPHATE 10 MG/ML IJ SOLN
INTRAMUSCULAR | Status: DC | PRN
  Administered 2021-08-18: 10 mg via INTRAVENOUS

## 2021-08-18 MED ORDER — SUCCINYLCHOLINE CHLORIDE 200 MG/10ML IV SOSY
PREFILLED_SYRINGE | INTRAVENOUS | Status: AC
  Filled 2021-08-18: qty 10

## 2021-08-18 MED ORDER — SUCCINYLCHOLINE CHLORIDE 200 MG/10ML IV SOSY
PREFILLED_SYRINGE | INTRAVENOUS | Status: DC | PRN
  Administered 2021-08-18: 140 mg via INTRAVENOUS

## 2021-08-18 MED ORDER — CEFAZOLIN SODIUM-DEXTROSE 2-4 GM/100ML-% IV SOLN
2.0000 g | INTRAVENOUS | Status: AC
  Administered 2021-08-18: 2 g via INTRAVENOUS

## 2021-08-18 MED ORDER — FENTANYL CITRATE PF 50 MCG/ML IJ SOSY: 25.0000 ug | PREFILLED_SYRINGE | INTRAMUSCULAR | Status: DC | PRN

## 2021-08-18 MED ORDER — ROCURONIUM BROMIDE 10 MG/ML (PF) SYRINGE
PREFILLED_SYRINGE | INTRAVENOUS | Status: AC
  Filled 2021-08-18: qty 10

## 2021-08-18 MED ORDER — ORAL CARE MOUTH RINSE: 15.0000 mL | Freq: Once | OROMUCOSAL | Status: AC

## 2021-08-18 MED ORDER — CEFAZOLIN SODIUM-DEXTROSE 2-4 GM/100ML-% IV SOLN
INTRAVENOUS | Status: AC
  Filled 2021-08-18: qty 100

## 2021-08-18 MED ORDER — CHLORHEXIDINE GLUCONATE CLOTH 2 % EX PADS: 6.0000 | MEDICATED_PAD | Freq: Once | CUTANEOUS | Status: DC

## 2021-08-18 MED ORDER — BUPIVACAINE LIPOSOME 1.3 % IJ SUSP
INTRAMUSCULAR | Status: AC
  Filled 2021-08-18: qty 20

## 2021-08-18 MED ORDER — LACTATED RINGERS IV SOLN: INTRAVENOUS | Status: DC

## 2021-08-18 MED ORDER — ROCURONIUM BROMIDE 100 MG/10ML IV SOLN
INTRAVENOUS | Status: DC | PRN
  Administered 2021-08-18: 45 mg via INTRAVENOUS

## 2021-08-18 SURGICAL SUPPLY — 43 items
ADH SKN CLS APL DERMABOND .7 (GAUZE/BANDAGES/DRESSINGS) ×1
APL PRP STRL LF DISP 70% ISPRP (MISCELLANEOUS) ×1
APL SRG 38 LTWT LNG FL B (MISCELLANEOUS) ×1
APPLICATOR ARISTA FLEXITIP XL (MISCELLANEOUS) ×2 IMPLANT
APPLIER CLIP ROT 10 11.4 M/L (STAPLE) ×2
APR CLP MED LRG 11.4X10 (STAPLE) ×1
BAG RETRIEVAL 10 (BASKET) ×1
CHLORAPREP W/TINT 26 (MISCELLANEOUS) ×2 IMPLANT
CLIP APPLIE ROT 10 11.4 M/L (STAPLE) ×1 IMPLANT
CLOTH BEACON ORANGE TIMEOUT ST (SAFETY) ×2 IMPLANT
COVER LIGHT HANDLE STERIS (MISCELLANEOUS) ×4 IMPLANT
DERMABOND ADVANCED (GAUZE/BANDAGES/DRESSINGS) ×1
DERMABOND ADVANCED .7 DNX12 (GAUZE/BANDAGES/DRESSINGS) ×1 IMPLANT
ELECT REM PT RETURN 9FT ADLT (ELECTROSURGICAL) ×2
ELECTRODE REM PT RTRN 9FT ADLT (ELECTROSURGICAL) ×1 IMPLANT
GAUZE 4X4 16PLY ~~LOC~~+RFID DBL (SPONGE) ×2 IMPLANT
GLOVE SURG POLYISO LF SZ7.5 (GLOVE) ×2 IMPLANT
GLOVE SURG UNDER POLY LF SZ7 (GLOVE) ×4 IMPLANT
GOWN STRL REUS W/TWL LRG LVL3 (GOWN DISPOSABLE) ×6 IMPLANT
HEMOSTAT ARISTA ABSORB 3G PWDR (HEMOSTASIS) ×2 IMPLANT
HEMOSTAT SNOW SURGICEL 2X4 (HEMOSTASIS) ×2 IMPLANT
INST SET LAPROSCOPIC AP (KITS) ×2 IMPLANT
KIT TURNOVER KIT A (KITS) ×2 IMPLANT
MANIFOLD NEPTUNE II (INSTRUMENTS) ×2 IMPLANT
NEEDLE HYPO 18GX1.5 BLUNT FILL (NEEDLE) ×2 IMPLANT
NEEDLE HYPO 21X1.5 SAFETY (NEEDLE) ×2 IMPLANT
NEEDLE INSUFFLATION 14GA 120MM (NEEDLE) ×2 IMPLANT
NS IRRIG 1000ML POUR BTL (IV SOLUTION) ×2 IMPLANT
PACK LAP CHOLE LZT030E (CUSTOM PROCEDURE TRAY) ×2 IMPLANT
PAD ARMBOARD 7.5X6 YLW CONV (MISCELLANEOUS) ×2 IMPLANT
SET BASIN LINEN APH (SET/KITS/TRAYS/PACK) ×2 IMPLANT
SET TUBE SMOKE EVAC HIGH FLOW (TUBING) ×2 IMPLANT
SLEEVE ENDOPATH XCEL 5M (ENDOMECHANICALS) ×2 IMPLANT
SUT MNCRL AB 4-0 PS2 18 (SUTURE) ×4 IMPLANT
SUT VICRYL 0 UR6 27IN ABS (SUTURE) ×2 IMPLANT
SYR 20ML LL LF (SYRINGE) ×4 IMPLANT
SYS BAG RETRIEVAL 10MM (BASKET) ×1
SYSTEM BAG RETRIEVAL 10MM (BASKET) ×1 IMPLANT
TROCAR ENDO BLADELESS 11MM (ENDOMECHANICALS) ×2 IMPLANT
TROCAR XCEL NON-BLD 5MMX100MML (ENDOMECHANICALS) ×2 IMPLANT
TROCAR XCEL UNIV SLVE 11M 100M (ENDOMECHANICALS) ×2 IMPLANT
TUBE CONNECTING 12X1/4 (SUCTIONS) ×2 IMPLANT
WARMER LAPAROSCOPE (MISCELLANEOUS) ×2 IMPLANT

## 2021-08-18 NOTE — Op Note (Signed)
Patient:  Bonnie Weaver  DOB:  October 15, 1986  MRN:  [id number]   Preop Diagnosis: Biliary colic, cholelithiasis, intrauterine pregnancy  Postop Diagnosis: Same  Procedure: Laparoscopic cholecystectomy  Surgeon: Franky Macho, MD  Anes: General endotracheal  Indications: Patient is a 35 year old white female who is [redacted] weeks pregnant who presents with biliary colic secondary to cholelithiasis.  At the recommendation of Dr. Despina Hidden of obstetrics, the patient needs her gallbladder out to maintain adequate p.o. intake for both the patient and fetus.  The risks and benefits of the procedure including bleeding, infection, hepatobiliary injury, the possibility of an open procedure, and the possibility of miscarriage were fully explained to the patient, who gave informed consent.  Procedure note: Fetal heart tones were found preoperatively.  After induction of general endotracheal anesthesia, the abdomen was prepped and draped using the usual sterile technique with ChloraPrep.  Surgical site confirmation was performed.  A supraumbilical incision was made superior to the fundal edge which was felt to be below the umbilicus.  The dissection was taken down to the fascia.  The Veress needle was introduced into the abdominal cavity and confirmation of placement was done using the saline drop test.  The abdomen was then insufflated to 15 mmHg pressure.  An 11 mm trocar was introduced into the abdominal cavity under direct visualization without difficulty.  The patient was placed in reverse Trendelenburg position and an additional 1 mm trocar was placed in the epigastric region and 5 mm trochars were placed in the right upper quadrant and right flank regions.  The uterus was inspected and there was no evidence of injury.  The liver was noted to be within normal limits.  The gallbladder was retracted in a dynamic fashion in order to provide a critical view of the triangle of Calot.  The cystic duct was first  identified.  Its junction to the infundibulum was fully identified.  Endoclips were placed proximally and distally on the cystic duct, and the cystic duct was divided.  This was likewise done to the cystic artery.  The gallbladder was freed away from the gallbladder fossa using Bovie electrocautery.  The gallbladder was delivered through the epigastric trocar site using an Endo Catch bag.  The gallbladder fossa was inspected and no abnormal bleeding or bile leakage was noted.  Arista and Surgicel were placed in the gallbladder fossa.  All fluid and air were then evacuated from the abdominal cavity prior to removal of the trochars.  All wounds were irrigated with normal saline.  All wounds were injected with Exparel.  The supraumbilical fascia as well as epigastric fascia were reapproximated using 0 Vicryl interrupted sutures.  All skin incisions were closed using a 4-0 Monocryl subcuticular suture.  Dermabond was applied.  All tape and needle counts were correct at the end of the procedure.  The patient was extubated in the operating room and transferred to PACU in stable condition.  In PACU, good fetal heart tones were found.  Complications: None  EBL: Minimal  Specimen: Gallbladder

## 2021-08-18 NOTE — Anesthesia Preprocedure Evaluation (Signed)
Anesthesia Evaluation  Patient identified by MRN, date of birth, ID band Patient awake    Reviewed: Allergy & Precautions, H&P , NPO status , Patient's Chart, lab work & pertinent test results, reviewed documented beta blocker date and time   Airway Mallampati: II  TM Distance: >3 FB Neck ROM: full    Dental no notable dental hx.    Pulmonary neg pulmonary ROS, asthma , Current Smoker and Patient abstained from smoking.,    Pulmonary exam normal breath sounds clear to auscultation       Cardiovascular Exercise Tolerance: Good negative cardio ROS   Rhythm:regular Rate:Normal     Neuro/Psych PSYCHIATRIC DISORDERS Anxiety Bipolar Disorder negative neurological ROS  negative psych ROS   GI/Hepatic negative GI ROS, Neg liver ROS,   Endo/Other  negative endocrine ROS  Renal/GU negative Renal ROS  negative genitourinary   Musculoskeletal   Abdominal   Peds  Hematology negative hematology ROS (+)   Anesthesia Other Findings   Reproductive/Obstetrics negative OB ROS (+) Pregnancy                             Anesthesia Physical Anesthesia Plan  ASA: 2  Anesthesia Plan: General   Post-op Pain Management:    Induction:   PONV Risk Score and Plan: Ondansetron  Airway Management Planned: Oral ETT  Additional Equipment:   Intra-op Plan:   Post-operative Plan:   Informed Consent: I have reviewed the patients History and Physical, chart, labs and discussed the procedure including the risks, benefits and alternatives for the proposed anesthesia with the patient or authorized representative who has indicated his/her understanding and acceptance.     Dental Advisory Given  Plan Discussed with: CRNA  Anesthesia Plan Comments: ([redacted] wk pregnant Check FHR before and after. NO Midazolam)        Anesthesia Quick Evaluation

## 2021-08-18 NOTE — Anesthesia Procedure Notes (Signed)
Procedure Name: Intubation Date/Time: 08/18/2021 11:11 AM Performed by: Brynda Peon, CRNA Pre-anesthesia Checklist: Patient identified, Emergency Drugs available, Suction available, Patient being monitored and Timeout performed Patient Re-evaluated:Patient Re-evaluated prior to induction Oxygen Delivery Method: Circle system utilized Preoxygenation: Pre-oxygenation with 100% oxygen Induction Type: IV induction, Rapid sequence and Cricoid Pressure applied Laryngoscope Size: Miller and 2 Grade View: Grade I Tube type: Oral Tube size: 7.0 mm Number of attempts: 1 Airway Equipment and Method: Stylet Placement Confirmation: ETT inserted through vocal cords under direct vision, positive ETCO2, CO2 detector and breath sounds checked- equal and bilateral Secured at: 21 cm Tube secured with: Tape Dental Injury: Teeth and Oropharynx as per pre-operative assessment

## 2021-08-18 NOTE — Anesthesia Postprocedure Evaluation (Signed)
Anesthesia Post Note  Patient: Bonnie Weaver  Procedure(s) Performed: LAPAROSCOPIC CHOLECYSTECTOMY (Abdomen)  Patient location during evaluation: Phase II Anesthesia Type: General Level of consciousness: awake Pain management: pain level controlled Vital Signs Assessment: post-procedure vital signs reviewed and stable Respiratory status: spontaneous breathing and respiratory function stable Cardiovascular status: blood pressure returned to baseline and stable Postop Assessment: no headache and no apparent nausea or vomiting Anesthetic complications: no Comments: Late entry   No notable events documented.   Last Vitals:  Vitals:   08/18/21 1245 08/18/21 1300  BP: 119/80 118/70  Pulse:    Resp: 20 13  Temp:    SpO2:  100%    Last Pain:  Vitals:   08/18/21 1300  TempSrc:   PainSc: 7                  Windell Norfolk

## 2021-08-18 NOTE — Interval H&P Note (Signed)
History and Physical Interval Note:  08/18/2021 10:55 AM  Bonnie Weaver  has presented today for surgery, with the diagnosis of Cholelithiasis.  The various methods of treatment have been discussed with the patient and family. After consideration of risks, benefits and other options for treatment, the patient has consented to  Procedure(s) with comments: LAPAROSCOPIC CHOLECYSTECTOMY (N/A) - pt knows to arrive at 9:30 as a surgical intervention.  The patient's history has been reviewed, patient examined, no change in status, stable for surgery.  I have reviewed the patient's chart and labs.  Questions were answered to the patient's satisfaction.     Franky Macho

## 2021-08-18 NOTE — Transfer of Care (Signed)
Immediate Anesthesia Transfer of Care Note  Patient: Bonnie Weaver  Procedure(s) Performed: LAPAROSCOPIC CHOLECYSTECTOMY (Abdomen)  Patient Location: PACU  Anesthesia Type:General  Level of Consciousness: awake, alert , oriented, patient cooperative and responds to stimulation  Airway & Oxygen Therapy: Patient Spontanous Breathing  Post-op Assessment: Report given to RN, Post -op Vital signs reviewed and stable and Patient moving all extremities X 4  Post vital signs: Reviewed and stable  Last Vitals:  Vitals Value Taken Time  BP 120/76 08/18/21 1207  Temp    Pulse 85 08/18/21 1214  Resp 26 08/18/21 1214  SpO2 99 % 08/18/21 1214  Vitals shown include unvalidated device data.  Last Pain:  Vitals:   08/18/21 0939  TempSrc: Oral  PainSc: 0-No pain      Patients Stated Pain Goal: 6 (08/18/21 0939)  Complications: No notable events documented.

## 2021-08-21 LAB — SURGICAL PATHOLOGY

## 2021-08-22 ENCOUNTER — Encounter (HOSPITAL_COMMUNITY): Payer: Self-pay | Admitting: General Surgery

## 2021-08-23 ENCOUNTER — Ambulatory Visit (INDEPENDENT_AMBULATORY_CARE_PROVIDER_SITE_OTHER): Payer: Medicaid Other | Admitting: Women's Health

## 2021-08-23 ENCOUNTER — Other Ambulatory Visit: Payer: Self-pay

## 2021-08-23 ENCOUNTER — Ambulatory Visit (INDEPENDENT_AMBULATORY_CARE_PROVIDER_SITE_OTHER): Payer: Medicaid Other

## 2021-08-23 ENCOUNTER — Encounter: Payer: Self-pay | Admitting: Women's Health

## 2021-08-23 VITALS — BP 119/70 | HR 96 | Wt 166.6 lb

## 2021-08-23 DIAGNOSIS — Z363 Encounter for antenatal screening for malformations: Secondary | ICD-10-CM

## 2021-08-23 DIAGNOSIS — Z3480 Encounter for supervision of other normal pregnancy, unspecified trimester: Secondary | ICD-10-CM

## 2021-08-23 DIAGNOSIS — Z3482 Encounter for supervision of other normal pregnancy, second trimester: Secondary | ICD-10-CM

## 2021-08-23 DIAGNOSIS — R399 Unspecified symptoms and signs involving the genitourinary system: Secondary | ICD-10-CM

## 2021-08-23 DIAGNOSIS — Z3A21 21 weeks gestation of pregnancy: Secondary | ICD-10-CM

## 2021-08-23 DIAGNOSIS — Z362 Encounter for other antenatal screening follow-up: Secondary | ICD-10-CM

## 2021-08-23 LAB — POCT URINALYSIS DIPSTICK OB
Blood, UA: NEGATIVE
Glucose, UA: NEGATIVE
Ketones, UA: NEGATIVE
Leukocytes, UA: NEGATIVE
Nitrite, UA: NEGATIVE
POC,PROTEIN,UA: NEGATIVE

## 2021-08-23 NOTE — Progress Notes (Signed)
LOW-RISK PREGNANCY VISIT Patient name: Bonnie Weaver MRN [id number]  Date of birth: November 21, 1985 Chief Complaint:   Routine Prenatal Visit  History of Present Illness:   Bonnie Weaver is a 35 y.o. G38P1011 female at [redacted]w[redacted]d with an Estimated Date of Delivery: 12/31/21 being seen today for ongoing management of a low-risk pregnancy.   Today she reports no complaints. Had cholecystectomy 11/4 w/ Dr. Lovell Sheehan, has had bm since, last one on Monday. Suboxone 4mg  QID. Wants RCS. Some occ brown spotting.  Contractions: Not present. Vag. Bleeding: Scant.  Movement: Present. denies leaking of fluid.  Depression screen [facility] 2/9 06/27/2021 05/16/2021  Decreased Interest 0 0  Down, Depressed, Hopeless 0 0  PHQ - 2 Score 0 0  Altered sleeping 1 -  Tired, decreased energy 2 -  Change in appetite 0 -  Feeling bad or failure about yourself  0 -  Trouble concentrating 1 -  Moving slowly or fidgety/restless 0 -  Suicidal thoughts 0 -  PHQ-9 Score 4 -     GAD 7 : Generalized Anxiety Score 06/27/2021  Nervous, Anxious, on Edge 1  Control/stop worrying 1  Worry too much - different things 0  Trouble relaxing 0  Restless 0  Easily annoyed or irritable 2  Afraid - awful might happen 1  Total GAD 7 Score 5      Review of Systems:   Pertinent items are noted in HPI Denies abnormal vaginal discharge w/ itching/odor/irritation, headaches, visual changes, shortness of breath, chest pain, abdominal pain, severe nausea/vomiting, or problems with urination or bowel movements unless otherwise stated above. Pertinent History Reviewed:  Reviewed past medical,surgical, social, obstetrical and family history.  Reviewed problem list, medications and allergies. Physical Assessment:   Vitals:   08/23/21 1123  BP: 119/70  Pulse: 96  Weight: 166 lb 9.6 oz (75.6 kg)  Body mass index is 30.47 kg/m.        Physical Examination:   General appearance: Well appearing, and in no distress  Mental status: Alert,  oriented to person, place, and time  Skin: Warm & dry  Cardiovascular: Normal heart rate noted  Respiratory: Normal respiratory effort, no distress  Abdomen: Soft, gravid, nontender, incisions healing well, no s/s infection, abd slightly distended  Pelvic: Cervical exam deferred         Extremities: Edema: None  Fetal Status: Fetal Heart Rate (bpm): 157 u/s   Movement: Present  13/09/22 21+3 wks,breech,posterior low lying placenta gr 0,tip of placenta to CX 1.5 cm,cx length 3.7 cm,svp of fluid 4.3 cm,normal ovaries,limited view of S/spine please have come back for additional images,EFW 408 g 33%,FHR 157 bpm  Chaperone: N/A   Results for orders placed or performed in visit on 08/23/21 (from the past 24 hour(s))  POC Urinalysis Dipstick OB   Collection Time: 08/23/21 11:27 AM  Result Value Ref Range   Color, UA     Clarity, UA     Glucose, UA Negative Negative   Bilirubin, UA     Ketones, UA neg    Spec Grav, UA     Blood, UA neg    pH, UA     POC,PROTEIN,UA Negative Negative, Trace, Small (1+), Moderate (2+), Large (3+), 4+   Urobilinogen, UA     Nitrite, UA neg    Leukocytes, UA Negative Negative   Appearance     Odor      Assessment & Plan:  1) Low-risk pregnancy G3P1011 at [redacted]w[redacted]d with an Estimated Date of  Delivery: 12/31/21   2) Suboxone maintenance, 4mg  QID w/ Dr.  3) Prev c/s> wants RCS  4) Recent cholecystectomy> incisions look good, a little distended, last bm Monday, take colace/miralax, if not improving let Tuesday know  5) H/O pre-e> ASA  6) Low-lying placenta> 1.5cm from os, some occ brown spotting, pelvic rest, repeat next visit (need to f/u on spine as well)   Meds: No orders of the defined types were placed in this encounter.  Labs/procedures today: U/S, 2nd IT  Plan:  Continue routine obstetrical care  Next visit: prefers in person    Reviewed: Preterm labor symptoms and general obstetric precautions including but not limited to vaginal bleeding,  contractions, leaking of fluid and fetal movement were reviewed in detail with the patient.  All questions were answered. Does have home bp cuff. Office bp cuff given: not applicable. Check bp weekly, let Lovell Sheehan know if consistently >140 and/or >90.  Follow-up: Return in about 4 weeks (around 09/20/2021) for LROB, US:OB F/U spine/placenta, MD or CNM, in person; get pap from [facility] please.  No future appointments.  Orders Placed This Encounter  Procedures   14/04/2021 OB Follow Up   POC Urinalysis Dipstick OB   US CNM, [facility] 08/23/2021 11:56 AM

## 2021-08-23 NOTE — Progress Notes (Signed)
[place] 21+3 wks,breech,posterior low lying placenta gr 0,tip of placenta to CX 1.5 cm,cx length 3.7 cm,svp of fluid 4.3 cm,normal ovaries,limited view of S/spine please have come back for additional images,EFW 408 g 33%,FHR 157 bpm

## 2021-08-23 NOTE — Addendum Note (Signed)
Addended by: Leilani Able, Normand Damron A on: 08/23/2021 01:18 PM   Modules accepted: Orders

## 2021-08-23 NOTE — Patient Instructions (Signed)
Bonnie Weaver, thank you for choosing our office today! We appreciate the opportunity to meet your healthcare needs. You may receive a short survey by mail, e-mail, or through MyChart. If you are happy with your care we would appreciate if you could take just a few minutes to complete the survey questions. We read all of your comments and take your feedback very seriously. Thank you again for choosing our office.  [facility] Team at [facility] ([address] [place], [place] [address]) Entrance C, located off of [address] Free 24/7 valet parking  Go to [email or web address] to register for FREE online childbirth classes  Call the office ([phone]) or go to [facility] if: You begin to severe cramping Your water breaks.  Sometimes it is a big gush of fluid, sometimes it is just a trickle that keeps getting your panties wet or running down your legs You have vaginal bleeding.  It is normal to have a small amount of spotting if your cervix was checked.   [place] Pediatricians/Family Doctors [facility] ([facility]): [address], [phone]           [facility]: [address], [phone]                [facility] ([facility]): [address], [phone] (call to ask if accepting patients) [facility]: [address], [place], [phone]    [place] Pediatricians/Family Doctors [facility] ([facility]): [address], [phone] [facility]: [address], [phone] [facility]: [address], [phone]  [place] [facility]  [facility] ([facility]): [phone] [facility]: [address], [phone]   [place] Family Doctors [facility]: [address], [phone]  [place] [facility]  [facility]: [address], [phone]  Home Blood Pressure Monitoring for Patients   Your provider has recommended that you check your blood pressure (BP) at least once a week at home. If you do not have a blood pressure cuff at home, one will be provided for you. Contact your provider if you have not received your monitor within 1 week.   Helpful Tips for Accurate Home Blood Pressure Checks  Don't smoke, exercise, or drink caffeine 30 minutes before checking your BP Use the restroom before checking your BP (a full bladder can raise your pressure) Relax in a comfortable upright chair Feet on the ground Left arm resting comfortably on a flat surface at the level of your heart Legs uncrossed Back supported Sit quietly and don't talk Place the cuff on your bare arm Adjust snuggly, so that only two fingertips can fit between your skin and the top of the cuff Check 2 readings separated by at least one minute Keep a log of your BP readings For a visual, please reference this diagram: http://ccnc.care/bpdiagram  Provider Name: [facility]     Phone: [phone]  Zone 1: ALL CLEAR  Continue to monitor your symptoms:  BP reading is less than 140 (top number) or less than 90 (bottom number)  No right upper stomach pain No headaches or seeing spots No feeling nauseated or throwing up No swelling in face and hands  Zone 2: CAUTION Call your doctor's office for any of the following:  BP reading is greater than 140 (top number) or greater than   90 (bottom number)  Stomach pain under your ribs in the middle or right side Headaches or seeing spots Feeling nauseated or throwing up Swelling in face and hands  Zone 3: EMERGENCY  Seek immediate medical care if you have any of the following:  BP reading is greater than160 (top number) or greater than 110 (bottom number) Severe headaches not improving with Tylenol Serious difficulty catching your breath Any worsening symptoms from  Zone 2     Second Trimester of Pregnancy The second trimester is from week 14 through week 27 (months 4 through 6). The second trimester is often a time when you feel your best. Your body has adjusted to being pregnant, and you begin to feel better physically. Usually, morning sickness has lessened or quit completely, you may have more energy, and you may have an increase in appetite. The second trimester is also a time when the fetus is growing rapidly. At the end of the sixth month, the fetus is about 9 inches long and weighs about 1 pounds. You will likely begin to feel the baby move (quickening) between 16 and 20 weeks of pregnancy. Body changes during your second trimester Your body continues to go through many changes during your second trimester. The changes vary from woman to woman. Your weight will continue to increase. You will notice your lower abdomen bulging out. You may begin to get stretch marks on your hips, abdomen, and breasts. You may develop headaches that can be relieved by medicines. The medicines should be approved by your health care provider. You may urinate more often because the fetus is pressing on your bladder. You may develop or continue to have heartburn as a result of your pregnancy. You may develop constipation because certain hormones are causing the muscles that push waste through your intestines to slow down. You may develop hemorrhoids or swollen, bulging veins (varicose veins). You may have back pain. This is caused by: Weight gain. Pregnancy hormones that are relaxing the joints in your pelvis. A shift in weight and the muscles that support your balance. Your breasts will continue to grow and they will continue to become tender. Your gums may bleed and may be sensitive to brushing and flossing. Dark spots or blotches (chloasma, mask of pregnancy) may develop on your face. This will likely fade after the baby is born. A dark line from your belly button to  the pubic area (linea nigra) may appear. This will likely fade after the baby is born. You may have changes in your hair. These can include thickening of your hair, rapid growth, and changes in texture. Some women also have hair loss during or after pregnancy, or hair that feels dry or thin. Your hair will most likely return to normal after your baby is born.  What to expect at prenatal visits During a routine prenatal visit: You will be weighed to make sure you and the fetus are growing normally. Your blood pressure will be taken. Your abdomen will be measured to track your baby's growth. The fetal heartbeat will be listened to. Any test results from the previous visit will be discussed.  Your health care provider may ask you: How you are feeling. If you are feeling the baby move. If you have had any abnormal symptoms, such as leaking fluid, bleeding, severe headaches, or abdominal cramping. If you are using any tobacco products, including cigarettes, chewing tobacco, and electronic cigarettes. If you have any questions.  Other tests that may be performed during   your second trimester include: Blood tests that check for: Low iron levels (anemia). High blood sugar that affects pregnant women (gestational diabetes) between 24 and 28 weeks. Rh antibodies. This is to check for a protein on red blood cells (Rh factor). Urine tests to check for infections, diabetes, or protein in the urine. An ultrasound to confirm the proper growth and development of the baby. An amniocentesis to check for possible genetic problems. Fetal screens for spina bifida and Down syndrome. HIV (human immunodeficiency virus) testing. Routine prenatal testing includes screening for HIV, unless you choose not to have this test.  Follow these instructions at home: Medicines Follow your health care provider's instructions regarding medicine use. Specific medicines may be either safe or unsafe to take during  pregnancy. Take a prenatal vitamin that contains at least 600 micrograms (mcg) of folic acid. If you develop constipation, try taking a stool softener if your health care provider approves. Eating and drinking Eat a balanced diet that includes fresh fruits and vegetables, whole grains, good sources of protein such as meat, eggs, or tofu, and low-fat dairy. Your health care provider will help you determine the amount of weight gain that is right for you. Avoid raw meat and uncooked cheese. These carry germs that can cause birth defects in the baby. If you have low calcium intake from food, talk to your health care provider about whether you should take a daily calcium supplement. Limit foods that are high in fat and processed sugars, such as fried and sweet foods. To prevent constipation: Drink enough fluid to keep your urine clear or pale yellow. Eat foods that are high in fiber, such as fresh fruits and vegetables, whole grains, and beans. Activity Exercise only as directed by your health care provider. Most women can continue their usual exercise routine during pregnancy. Try to exercise for 30 minutes at least 5 days a week. Stop exercising if you experience uterine contractions. Avoid heavy lifting, wear low heel shoes, and practice good posture. A sexual relationship may be continued unless your health care provider directs you otherwise. Relieving pain and discomfort Wear a good support bra to prevent discomfort from breast tenderness. Take warm sitz baths to soothe any pain or discomfort caused by hemorrhoids. Use hemorrhoid cream if your health care provider approves. Rest with your legs elevated if you have leg cramps or low back pain. If you develop varicose veins, wear support hose. Elevate your feet for 15 minutes, 3-4 times a day. Limit salt in your diet. Prenatal Care Write down your questions. Take them to your prenatal visits. Keep all your prenatal visits as told by your health  care provider. This is important. Safety Wear your seat belt at all times when driving. Make a list of emergency phone numbers, including numbers for family, friends, the hospital, and police and fire departments. General instructions Ask your health care provider for a referral to a local prenatal education class. Begin classes no later than the beginning of month 6 of your pregnancy. Ask for help if you have counseling or nutritional needs during pregnancy. Your health care provider can offer advice or refer you to specialists for help with various needs. Do not use hot tubs, steam rooms, or saunas. Do not douche or use tampons or scented sanitary pads. Do not cross your legs for long periods of time. Avoid cat litter boxes and soil used by cats. These carry germs that can cause birth defects in the baby and possibly loss of the   fetus by miscarriage or stillbirth. Avoid all smoking, herbs, alcohol, and unprescribed drugs. Chemicals in these products can affect the formation and growth of the baby. Do not use any products that contain nicotine or tobacco, such as cigarettes and e-cigarettes. If you need help quitting, ask your health care provider. Visit your dentist if you have not gone yet during your pregnancy. Use a soft toothbrush to brush your teeth and be gentle when you floss. Contact a health care provider if: You have dizziness. You have mild pelvic cramps, pelvic pressure, or nagging pain in the abdominal area. You have persistent nausea, vomiting, or diarrhea. You have a bad smelling vaginal discharge. You have pain when you urinate. Get help right away if: You have a fever. You are leaking fluid from your vagina. You have spotting or bleeding from your vagina. You have severe abdominal cramping or pain. You have rapid weight gain or weight loss. You have shortness of breath with chest pain. You notice sudden or extreme swelling of your face, hands, ankles, feet, or legs. You  have not felt your baby move in over an hour. You have severe headaches that do not go away when you take medicine. You have vision changes. Summary The second trimester is from week 14 through week 27 (months 4 through 6). It is also a time when the fetus is growing rapidly. Your body goes through many changes during pregnancy. The changes vary from woman to woman. Avoid all smoking, herbs, alcohol, and unprescribed drugs. These chemicals affect the formation and growth your baby. Do not use any tobacco products, such as cigarettes, chewing tobacco, and e-cigarettes. If you need help quitting, ask your health care provider. Contact your health care provider if you have any questions. Keep all prenatal visits as told by your health care provider. This is important. This information is not intended to replace advice given to you by your health care provider. Make sure you discuss any questions you have with your health care provider. Document Released: 09/25/2001 Document Revised: 03/08/2016 Document Reviewed: 12/02/2012 Elsevier Interactive Patient Education  2017 [employer]vier Inc.  

## 2021-08-25 ENCOUNTER — Telehealth (INDEPENDENT_AMBULATORY_CARE_PROVIDER_SITE_OTHER): Payer: Medicaid Other | Admitting: General Surgery

## 2021-08-25 DIAGNOSIS — Z09 Encounter for follow-up examination after completed treatment for conditions other than malignant neoplasm: Secondary | ICD-10-CM

## 2021-08-25 LAB — INTEGRATED 2
AFP MoM: 1.44
Alpha-Fetoprotein: 85.4 ng/mL
Crown Rump Length: 71.5 mm
DIA MoM: 1.74
DIA Value: 352.2 pg/mL
Estriol, Unconjugated: 2.08 ng/mL
Gest. Age on Collection Date: 13.1 weeks
Gestational Age: 21.3 weeks
Maternal Age at EDD: 35.4 yr
Nuchal Translucency (NT): 1.6 mm
Nuchal Translucency MoM: 0.98
Number of Fetuses: 1
PAPP-A MoM: 0.77
PAPP-A Value: 830.5 ng/mL
Test Results:: NEGATIVE
Weight: 165 [lb_av]
Weight: 175 [lb_av]
hCG MoM: 0.63
hCG Value: 13.1 IU/mL
uE3 MoM: 0.72

## 2021-08-25 NOTE — Telephone Encounter (Signed)
Virtual telephone visit performed with patient.  Patient states she is doing well.  Her preoperative symptoms have resolved.  Her incisional pain is resolving.  I told her to call me should any problems arise.  As this was a part of the global surgical fee, this was not a billable visit.  Total telephone time was 1 minute.

## 2021-08-28 LAB — URINE CULTURE

## 2021-09-20 ENCOUNTER — Other Ambulatory Visit: Payer: Self-pay

## 2021-09-20 ENCOUNTER — Ambulatory Visit (INDEPENDENT_AMBULATORY_CARE_PROVIDER_SITE_OTHER): Payer: Medicaid Other | Admitting: Advanced Practice Midwife

## 2021-09-20 ENCOUNTER — Ambulatory Visit (INDEPENDENT_AMBULATORY_CARE_PROVIDER_SITE_OTHER): Payer: Medicaid Other

## 2021-09-20 VITALS — BP 117/76 | HR 96 | Wt 173.0 lb

## 2021-09-20 DIAGNOSIS — Z3480 Encounter for supervision of other normal pregnancy, unspecified trimester: Secondary | ICD-10-CM

## 2021-09-20 DIAGNOSIS — Z3A25 25 weeks gestation of pregnancy: Secondary | ICD-10-CM

## 2021-09-20 DIAGNOSIS — Z362 Encounter for other antenatal screening follow-up: Secondary | ICD-10-CM

## 2021-09-20 DIAGNOSIS — Z3482 Encounter for supervision of other normal pregnancy, second trimester: Secondary | ICD-10-CM | POA: Diagnosis not present

## 2021-09-20 DIAGNOSIS — O99320 Drug use complicating pregnancy, unspecified trimester: Secondary | ICD-10-CM

## 2021-09-20 DIAGNOSIS — F1120 Opioid dependence, uncomplicated: Secondary | ICD-10-CM

## 2021-09-20 NOTE — Patient Instructions (Addendum)
Bonnie Weaver, I greatly value your feedback.  If you receive a survey following your visit with [place] today, we appreciate you taking the time to fill it out.  Thanks, Philipp Deputy, CNM  Neonatal Abstinence contact info: [phone]   You will have your sugar test next visit.  Please do not eat or drink anything after midnight the night before you come, not even water.  You will be here for at least two hours.  Please make an appointment online for the bloodwork at [email or web address] for 8:30am (or as close to this as possible). Make sure you select the [facility] service center. The day of the appointment, check in with our office first, then you will go to [facility] to start the sugar test.    [facility] HAS MOVED!!! It is now [facility] ([address] [place], [place] [address]) Entrance C, located off of [address] [employer] [address] valet parking  Go to [email or web address] to register for FREE online childbirth classes   Call the office [phone]) or go to [facility] if: You begin to have strong, frequent contractions Your water breaks.  Sometimes it is a big gush of fluid, sometimes it is just a trickle that keeps getting your panties wet or running down your legs You have vaginal bleeding.  It is normal to have a small amount of spotting if your cervix was checked.  You don't feel your baby moving like normal.  If you don't, get you something to eat and drink and lay down and focus on feeling your baby move.   If your baby is still not moving like normal, you should call the office or go to [facility].  [place] Pediatricians/Family Doctors: [facility] [phone]           [facility] [phone]                [facility] [phone] (usually not accepting new patients unless you have family there already, you are always welcome to call and ask)      [facility] Department [phone]        [facility] Pediatricians/Family Doctors:  [facility]: [phone] [facility]: [phone] [facility]: [phone]  [facility] Doctors:  [facility]: [phone]  [facility]: [phone]  [facility] Doctors: [facility]: [phone]   Home Blood Pressure Monitoring for Patients   Your provider has recommended that you check your blood pressure (BP) at least once a week at home. If you do not have a blood pressure cuff at home, one will be provided for you. Contact your provider if you have not received your monitor within 1 week.   Helpful Tips for Accurate Home Blood Pressure Checks  Don't smoke, exercise, or drink caffeine 30 minutes before checking your BP Use the restroom before checking your BP (a full bladder can raise your pressure) Relax in a comfortable upright chair Feet on the ground Left arm resting comfortably on a flat surface at the level of your heart Legs uncrossed Back supported Sit quietly and don't talk Place the cuff on your bare arm Adjust snuggly, so that only two fingertips can fit between your skin and the top of the cuff Check 2 readings separated by at least one minute Keep a log of your BP readings For a visual, please reference this diagram: http://ccnc.care/bpdiagram  Provider Name: [facility]     Phone:  [phone]  Zone 1: ALL CLEAR  Continue to monitor your symptoms:  BP reading is less than 140 (top number) or less than 90 (bottom number)  No right upper stomach pain No headaches or seeing spots No feeling nauseated or throwing up No swelling in face and hands  Zone 2: CAUTION Call your doctor's office for any of the following:  BP reading is greater than 140 (top number) or greater than 90 (bottom number)  Stomach pain under your ribs in the middle or right side Headaches or seeing spots Feeling nauseated or throwing  up Swelling in face and hands  Zone 3: EMERGENCY  Seek immediate medical care if you have any of the following:  BP reading is greater than160 (top number) or greater than 110 (bottom number) Severe headaches not improving with Tylenol Serious difficulty catching your breath Any worsening symptoms from Zone 2   Second Trimester of Pregnancy The second trimester is from week 13 through week 28, months 4 through 6. The second trimester is often a time when you feel your best. Your body has also adjusted to being pregnant, and you begin to feel better physically. Usually, morning sickness has lessened or quit completely, you may have more energy, and you may have an increase in appetite. The second trimester is also a time when the fetus is growing rapidly. At the end of the sixth month, the fetus is about 9 inches long and weighs about 1 pounds. You will likely begin to feel the baby move (quickening) between 18 and 20 weeks of the pregnancy. BODY CHANGES Your body goes through many changes during pregnancy. The changes vary from woman to woman.  Your weight will continue to increase. You will notice your lower abdomen bulging out. You may begin to get stretch marks on your hips, abdomen, and breasts. You may develop headaches that can be relieved by medicines approved by your health care provider. You may urinate more often because the fetus is pressing on your bladder. You may develop or continue to have heartburn as a result of your pregnancy. You may develop constipation because certain hormones are causing the muscles that push waste through your intestines to slow down. You may develop hemorrhoids or swollen, bulging veins (varicose veins). You may have back pain because of the weight gain and pregnancy hormones relaxing your joints between the bones in your pelvis and as a result of a shift in weight and the muscles that support your balance. Your breasts will continue to grow and be  tender. Your gums may bleed and may be sensitive to brushing and flossing. Dark spots or blotches (chloasma, mask of pregnancy) may develop on your face. This will likely fade after the baby is born. A dark line from your belly button to the pubic area (linea nigra) may appear. This will likely fade after the baby is born. You may have changes in your hair. These can include thickening of your hair, rapid growth, and changes in texture. Some women also have hair loss during or after pregnancy, or hair that feels dry or thin. Your hair will most likely return to normal after your baby is born. WHAT TO EXPECT AT YOUR PRENATAL VISITS During a routine prenatal visit: You will be weighed to make sure you and the fetus are growing normally. Your blood pressure will be taken. Your abdomen will be measured to track your baby's growth. The fetal heartbeat will be listened to. Any test results from the previous visit  will be discussed. Your health care provider may ask you: How you are feeling. If you are feeling the baby move. If you have had any abnormal symptoms, such as leaking fluid, bleeding, severe headaches, or abdominal cramping. If you have any questions. Other tests that may be performed during your second trimester include: Blood tests that check for: Low iron levels (anemia). Gestational diabetes (between 24 and 28 weeks). Rh antibodies. Urine tests to check for infections, diabetes, or protein in the urine. An ultrasound to confirm the proper growth and development of the baby. An amniocentesis to check for possible genetic problems. Fetal screens for spina bifida and Down syndrome. HOME CARE INSTRUCTIONS  Avoid all smoking, herbs, alcohol, and unprescribed drugs. These chemicals affect the formation and growth of the baby. Follow your health care provider's instructions regarding medicine use. There are medicines that are either safe or unsafe to take during pregnancy. Exercise only  as directed by your health care provider. Experiencing uterine cramps is a good sign to stop exercising. Continue to eat regular, healthy meals. Wear a good support bra for breast tenderness. Do not use hot tubs, steam rooms, or saunas. Wear your seat belt at all times when driving. Avoid raw meat, uncooked cheese, cat litter boxes, and soil used by cats. These carry germs that can cause birth defects in the baby. Take your prenatal vitamins. Try taking a stool softener (if your health care provider approves) if you develop constipation. Eat more high-fiber foods, such as fresh vegetables or fruit and whole grains. Drink plenty of fluids to keep your urine clear or pale yellow. Take warm sitz baths to soothe any pain or discomfort caused by hemorrhoids. Use hemorrhoid cream if your health care provider approves. If you develop varicose veins, wear support hose. Elevate your feet for 15 minutes, 3-4 times a day. Limit salt in your diet. Avoid heavy lifting, wear low heel shoes, and practice good posture. Rest with your legs elevated if you have leg cramps or low back pain. Visit your dentist if you have not gone yet during your pregnancy. Use a soft toothbrush to brush your teeth and be gentle when you floss. A sexual relationship may be continued unless your health care provider directs you otherwise. Continue to go to all your prenatal visits as directed by your health care provider. SEEK MEDICAL CARE IF:  You have dizziness. You have mild pelvic cramps, pelvic pressure, or nagging pain in the abdominal area. You have persistent nausea, vomiting, or diarrhea. You have a bad smelling vaginal discharge. You have pain with urination. SEEK IMMEDIATE MEDICAL CARE IF:  You have a fever. You are leaking fluid from your vagina. You have spotting or bleeding from your vagina. You have severe abdominal cramping or pain. You have rapid weight gain or loss. You have shortness of breath with chest  pain. You notice sudden or extreme swelling of your face, hands, ankles, feet, or legs. You have not felt your baby move in over an hour. You have severe headaches that do not go away with medicine. You have vision changes. Document Released: 09/25/2001 Document Revised: 10/06/2013 Document Reviewed: 12/02/2012 [facility] Patient Information 2015 [place], [place]. This information is not intended to replace advice given to you by your health care provider. Make sure you discuss any questions you have with your health care provider.

## 2021-09-20 NOTE — Progress Notes (Signed)
   LOW-RISK PREGNANCY VISIT Patient name: Bonnie Weaver MRN [id number]  Date of birth: February 05, 1986 Chief Complaint:   Routine Prenatal Visit and Pregnancy Ultrasound  History of Present Illness:   Bonnie Weaver is a 35 y.o. G85P1011 female at [redacted]w[redacted]d with an Estimated Date of Delivery: 12/31/21 being seen today for ongoing management of a low-risk pregnancy.  Today she reports  some bilat intermittent low abd pulling; no further vag spotting since last visit; completed K+ repletion course . Contractions: Not present. Vag. Bleeding: None.  Movement: Present. denies leaking of fluid. Review of Systems:   Pertinent items are noted in HPI Denies abnormal vaginal discharge w/ itching/odor/irritation, headaches, visual changes, shortness of breath, chest pain, abdominal pain, severe nausea/vomiting, or problems with urination or bowel movements unless otherwise stated above. Pertinent History Reviewed:  Reviewed past medical,surgical, social, obstetrical and family history.  Reviewed problem list, medications and allergies. Physical Assessment:   Vitals:   09/20/21 1207  BP: 117/76  Pulse: 96  Weight: 173 lb (78.5 kg)  Body mass index is 31.64 kg/m.        Physical Examination:   General appearance: Well appearing, and in no distress  Mental status: Alert, oriented to person, place, and time  Skin: Warm & dry  Cardiovascular: Normal heart rate noted  Respiratory: Normal respiratory effort, no distress  Abdomen: Soft, gravid, nontender  Pelvic: Cervical exam deferred         Extremities: Edema: None  Fetal Status: Fetal Heart Rate (bpm): 148 u/s   Movement: Present    F/U spine and LLP: [place] 25+3 wks,cephalic,cx 3.4 cm,tip of placenta to cx 3.4 cm,SVP of fluid 5.7 cm,anatomy of the spine complete,no obvious abnormalities,EFW 856 g 56%  No results found for this or any previous visit (from the past 24 hour(s)).  Assessment & Plan:  1) Low-risk pregnancy G3P1011 at [redacted]w[redacted]d with an  Estimated Date of Delivery: 12/31/21   2) Resolved LLP, now 3.4cm from os and posterior; spine views seen  3) Unable to obtain Pap (no records since 2013 from [place] [facility]), pt elects to do with 36wk GBS/cultures  4) Prev C/S, for rLTCS  5) OUD, Suboxone 4mg  QID from Dr  6) Previous hypokalemia, finished K+ repletion; 3.7 10/21; will check BMET w PN2   Meds: No orders of the defined types were placed in this encounter.  Labs/procedures today: f/u scan for spine/LLP  Plan:  Continue routine obstetrical care   Reviewed: Preterm labor symptoms and general obstetric precautions including but not limited to vaginal bleeding, contractions, leaking of fluid and fetal movement were reviewed in detail with the patient.  All questions were answered. Didn't ask about home bp cuff.  Check bp weekly, let 11/21 know if >140/90.   Follow-up: Return in about 3 weeks (around 10/11/2021) for LROB, PN2.  No orders of the defined types were placed in this encounter.  10/13/2021 CNM 09/20/2021 12:20 PM

## 2021-09-20 NOTE — Progress Notes (Signed)
[place] 25+3 wks,cephalic,cx 3.4 cm,tip of placenta to cx 3.4 cm,SVP of fluid 5.7 cm,anatomy of the spine complete,no obvious abnormalities,EFW 856 g 56%

## 2021-09-25 ENCOUNTER — Telehealth: Payer: Self-pay | Admitting: *Deleted

## 2021-09-25 NOTE — Telephone Encounter (Signed)
Patient states she has noticed her heart rate has been in the mid 120's for past two days while resting and with movement.  Denies any other symptoms.  She does have anxiety but does not feel as though this is contributing to the reason for the elevated heart rate.  She is not scheduled for another visit until 10/12/21.  Please advise.

## 2021-09-25 NOTE — Telephone Encounter (Signed)
Patient informed this can be normal for it to be a little elevated while pregnant. If cp, sob, dizziness, lightheadedness, etc before next appt let us know. Make sure staying hydrated. Pt verbalized understanding with no further questions.

## 2021-10-12 ENCOUNTER — Other Ambulatory Visit: Payer: Medicaid Other

## 2021-10-12 ENCOUNTER — Other Ambulatory Visit: Payer: Self-pay

## 2021-10-12 ENCOUNTER — Encounter: Payer: Self-pay | Admitting: Advanced Practice Midwife

## 2021-10-12 ENCOUNTER — Ambulatory Visit (INDEPENDENT_AMBULATORY_CARE_PROVIDER_SITE_OTHER): Payer: Medicaid Other | Admitting: Advanced Practice Midwife

## 2021-10-12 VITALS — BP 114/76 | HR 92 | Wt 180.5 lb

## 2021-10-12 DIAGNOSIS — Z3483 Encounter for supervision of other normal pregnancy, third trimester: Secondary | ICD-10-CM

## 2021-10-12 DIAGNOSIS — E876 Hypokalemia: Secondary | ICD-10-CM

## 2021-10-12 DIAGNOSIS — Z3A28 28 weeks gestation of pregnancy: Secondary | ICD-10-CM

## 2021-10-12 DIAGNOSIS — Z131 Encounter for screening for diabetes mellitus: Secondary | ICD-10-CM

## 2021-10-12 DIAGNOSIS — Z3480 Encounter for supervision of other normal pregnancy, unspecified trimester: Secondary | ICD-10-CM

## 2021-10-12 MED ORDER — DOXYLAMINE-PYRIDOXINE 10-10 MG PO TBEC: DELAYED_RELEASE_TABLET | ORAL | 6 refills | Status: DC

## 2021-10-12 MED ORDER — ONDANSETRON 4 MG PO TBDP: 4.0000 mg | ORAL_TABLET | Freq: Four times a day (QID) | ORAL | 2 refills | Status: DC | PRN

## 2021-10-12 NOTE — Patient Instructions (Signed)
Bonnie Weaver, I greatly value your feedback.  If you receive a survey following your visit with [place] today, we appreciate you taking the time to fill it out.  Thanks, Cathie Beams, CNM   [facility] HAS MOVED!!! It is now [facility] at [facility] ([address] [place], [place] [address]) Entrance located off of [address] Free 24/7 valet parking   Go to [email or web address] to register for FREE online childbirth classes    Call the office [phone]) or go to [facility] if: You begin to have strong, frequent contractions Your water breaks.  Sometimes it is a big gush of fluid, sometimes it is just a trickle that keeps getting your panties wet or running down your legs You have vaginal bleeding.  It is normal to have a small amount of spotting if your cervix was checked.  You don't feel your baby moving like normal.  If you don't, get you something to eat and drink and lay down and focus on feeling your baby move.  You should feel at least 10 movements in 2 hours.  If you don't, you should call the office or go to [facility], [facility], [facility].    Tdap Vaccine It is recommended that you get the Tdap vaccine during the third trimester of EACH pregnancy to help protect your baby from getting pertussis (whooping cough) 27-36 weeks is the BEST time to do this so that you can pass the protection on to your baby. During pregnancy is better than after pregnancy, but if you are unable to get it during pregnancy it will be offered at the hospital.  You will be offered this vaccine in the office after 27 weeks. If you do not have health insurance, you can get this vaccine at the health department or your family doctor Everyone who will be around your baby should also be up-to-date on their vaccines. Adults (who are not pregnant) only need 1 dose of Tdap during adulthood.   Third Trimester of Pregnancy The third trimester is from week 29 through week 42, months 7 through 9. The third  trimester is a time when the fetus is growing rapidly. At the end of the ninth month, the fetus is about 20 inches in length and weighs 6-10 pounds.  BODY CHANGES Your body goes through many changes during pregnancy. The changes vary from woman to woman.  Your weight will continue to increase. You can expect to gain 25-35 pounds (11-16 kg) by the end of the pregnancy. You may begin to get stretch marks on your hips, abdomen, and breasts. You may urinate more often because the fetus is moving lower into your pelvis and pressing on your bladder. You may develop or continue to have heartburn as a result of your pregnancy. You may develop constipation because certain hormones are causing the muscles that push waste through your intestines to slow down. You may develop hemorrhoids or swollen, bulging veins (varicose veins). You may have pelvic pain because of the weight gain and pregnancy hormones relaxing your joints between the bones in your pelvis. Backaches may result from overexertion of the muscles supporting your posture. You may have changes in your hair. These can include thickening of your hair, rapid growth, and changes in texture. Some women also have hair loss during or after pregnancy, or hair that feels dry or thin. Your hair will most likely return to normal after your baby is born. Your breasts will continue to grow and be tender. A  yellow discharge may leak from your breasts called colostrum. Your belly button may stick out. You may feel short of breath because of your expanding uterus. You may notice the fetus "dropping," or moving lower in your abdomen. You may have a bloody mucus discharge. This usually occurs a few days to a week before labor begins. Your cervix becomes thin and soft (effaced) near your due date. WHAT TO EXPECT AT YOUR PRENATAL EXAMS  You will have prenatal exams every 2 weeks until week 36. Then, you will have weekly prenatal exams. During a routine prenatal  visit: You will be weighed to make sure you and the fetus are growing normally. Your blood pressure is taken. Your abdomen will be measured to track your baby's growth. The fetal heartbeat will be listened to. Any test results from the previous visit will be discussed. You may have a cervical check near your due date to see if you have effaced. At around 36 weeks, your caregiver will check your cervix. At the same time, your caregiver will also perform a test on the secretions of the vaginal tissue. This test is to determine if a type of bacteria, Group B streptococcus, is present. Your caregiver will explain this further. Your caregiver may ask you: What your birth plan is. How you are feeling. If you are feeling the baby move. If you have had any abnormal symptoms, such as leaking fluid, bleeding, severe headaches, or abdominal cramping. If you have any questions. Other tests or screenings that may be performed during your third trimester include: Blood tests that check for low iron levels (anemia). Fetal testing to check the health, activity level, and growth of the fetus. Testing is done if you have certain medical conditions or if there are problems during the pregnancy. FALSE LABOR You may feel small, irregular contractions that eventually go away. These are called Braxton Hicks contractions, or false labor. Contractions may last for hours, days, or even weeks before true labor sets in. If contractions come at regular intervals, intensify, or become painful, it is best to be seen by your caregiver.  SIGNS OF LABOR  Menstrual-like cramps. Contractions that are 5 minutes apart or less. Contractions that start on the top of the uterus and spread down to the lower abdomen and back. A sense of increased pelvic pressure or back pain. A watery or bloody mucus discharge that comes from the vagina. If you have any of these signs before the 37th week of pregnancy, call your caregiver right away.  You need to go to the hospital to get checked immediately. HOME CARE INSTRUCTIONS  Avoid all smoking, herbs, alcohol, and unprescribed drugs. These chemicals affect the formation and growth of the baby. Follow your caregiver's instructions regarding medicine use. There are medicines that are either safe or unsafe to take during pregnancy. Exercise only as directed by your caregiver. Experiencing uterine cramps is a good sign to stop exercising. Continue to eat regular, healthy meals. Wear a good support bra for breast tenderness. Do not use hot tubs, steam rooms, or saunas. Wear your seat belt at all times when driving. Avoid raw meat, uncooked cheese, cat litter boxes, and soil used by cats. These carry germs that can cause birth defects in the baby. Take your prenatal vitamins. Try taking a stool softener (if your caregiver approves) if you develop constipation. Eat more high-fiber foods, such as fresh vegetables or fruit and whole grains. Drink plenty of fluids to keep your urine clear or pale yellow.  Take warm sitz baths to soothe any pain or discomfort caused by hemorrhoids. Use hemorrhoid cream if your caregiver approves. If you develop varicose veins, wear support hose. Elevate your feet for 15 minutes, 3-4 times a day. Limit salt in your diet. Avoid heavy lifting, wear low heal shoes, and practice good posture. Rest a lot with your legs elevated if you have leg cramps or low back pain. Visit your dentist if you have not gone during your pregnancy. Use a soft toothbrush to brush your teeth and be gentle when you floss. A sexual relationship may be continued unless your caregiver directs you otherwise. Do not travel far distances unless it is absolutely necessary and only with the approval of your caregiver. Take prenatal classes to understand, practice, and ask questions about the labor and delivery. Make a trial run to the hospital. Pack your hospital bag. Prepare the baby's  nursery. Continue to go to all your prenatal visits as directed by your caregiver. SEEK MEDICAL CARE IF: You are unsure if you are in labor or if your water has broken. You have dizziness. You have mild pelvic cramps, pelvic pressure, or nagging pain in your abdominal area. You have persistent nausea, vomiting, or diarrhea. You have a bad smelling vaginal discharge. You have pain with urination. SEEK IMMEDIATE MEDICAL CARE IF:  You have a fever. You are leaking fluid from your vagina. You have spotting or bleeding from your vagina. You have severe abdominal cramping or pain. You have rapid weight loss or gain. You have shortness of breath with chest pain. You notice sudden or extreme swelling of your face, hands, ankles, feet, or legs. You have not felt your baby move in over an hour. You have severe headaches that do not go away with medicine. You have vision changes. Document Released: 09/25/2001 Document Revised: 10/06/2013 Document Reviewed: 12/02/2012 [facility] Patient Information 2015 [place], [place]. This information is not intended to replace advice given to you by your health care provider. Make sure you discuss any questions you have with your health care provider.

## 2021-10-12 NOTE — Progress Notes (Signed)
° °  LOW-RISK PREGNANCY VISIT Patient name: Bonnie Weaver MRN [id number]  Date of birth: January 10, 1986 Chief Complaint:   Routine Prenatal Visit (PN2 today; having a lot of nausea; Phenergan helps some; pain in upper right side)  History of Present Illness:   Bonnie Weaver is a 35 y.o. G67P1011 female at [redacted]w[redacted]d with an Estimated Date of Delivery: 12/31/21 being seen today for ongoing management of a low-risk pregnancy.  Today she reports still nauseated. PHenergan works but makes her sleepy. Contractions: Not present. Vag. Bleeding: None.  Movement: Present. denies leaking of fluid. Review of Systems:   Pertinent items are noted in HPI Denies abnormal vaginal discharge w/ itching/odor/irritation, headaches, visual changes, shortness of breath, chest pain, abdominal pain, severe nausea/vomiting, or problems with urination or bowel movements unless otherwise stated above. Pertinent History Reviewed:  Reviewed past medical,surgical, social, obstetrical and family history.  Reviewed problem list, medications and allergies. Physical Assessment:   Vitals:   10/12/21 0955  BP: 114/76  Pulse: 92  Weight: 180 lb 8 oz (81.9 kg)  Body mass index is 33.01 kg/m.        Physical Examination:   General appearance: Well appearing, and in no distress  Mental status: Alert, oriented to person, place, and time  Skin: Warm & dry  Cardiovascular: Normal heart rate noted  Respiratory: Normal respiratory effort, no distress  Abdomen: Soft, gravid, nontender  Pelvic: Cervical exam deferred         Extremities: Edema: None  Fetal Status: Fetal Heart Rate (bpm): 138 Fundal Height: 30 cm Movement: Present    Chaperone: n/a    No results found for this or any previous visit (from the past 24 hour(s)).  Assessment & Plan:  1) Low-risk pregnancy G3P1011 at [redacted]w[redacted]d with an Estimated Date of Delivery: 12/31/21   2) OUD, continue suboxone.  NAS consult encouraged   3)  Nausea:  add diclegis (not taking atarax)  and zofran   Meds:  Meds ordered this encounter  Medications   Doxylamine-Pyridoxine (DICLEGIS) 10-10 MG TBEC    Sig: Take 2 qhs; may also take one in am and one in afternoon prn nausea    Dispense:  120 tablet    Refill:  6    Order Specific Question:   Supervising Provider    Answer:   Duane Lope H [[id number]]   ondansetron (ZOFRAN-ODT) 4 MG disintegrating tablet    Sig: Take 1 tablet (4 mg total) by mouth every 6 (six) hours as needed for nausea.    Dispense:  30 tablet    Refill:  2    Order Specific Question:   Supervising Provider    Answer:   Lazaro Arms [[id number]]   Labs/procedures today: PN2  Plan:  Continue routine obstetrical care  Next visit: prefers in person    Reviewed: Preterm labor symptoms and general obstetric precautions including but not limited to vaginal bleeding, contractions, leaking of fluid and fetal movement were reviewed in detail with the patient.  All questions were answered. Has home bp cuff. Check bp weekly, let us know if >140/90.   Follow-up: Return in about 3 weeks (around 11/02/2021) for [facility] Mychart visit.  No orders of the defined types were placed in this encounter.  Jacklyn Shell DNP, CNM 10/12/2021 11:00 AM

## 2021-10-13 LAB — COMPREHENSIVE METABOLIC PANEL
ALT: 7 IU/L (ref 0–32)
AST: 11 IU/L (ref 0–40)
Albumin/Globulin Ratio: 1.5 (ref 1.2–2.2)
Albumin: 3.5 g/dL — ABNORMAL LOW (ref 3.8–4.8)
Alkaline Phosphatase: 149 IU/L — ABNORMAL HIGH (ref 44–121)
BUN/Creatinine Ratio: 15 (ref 9–23)
BUN: 8 mg/dL (ref 6–20)
Bilirubin Total: <0.2 mg/dL (ref 0.0–1.2)
CO2: 21 mmol/L (ref 20–29)
Calcium: 9.2 mg/dL (ref 8.7–10.2)
Chloride: 106 mmol/L (ref 96–106)
Creatinine, Ser: 0.54 mg/dL — ABNORMAL LOW (ref 0.57–1.00)
Globulin, Total: 2.3 g/dL (ref 1.5–4.5)
Glucose: 93 mg/dL (ref 70–99)
Potassium: 4.1 mmol/L (ref 3.5–5.2)
Sodium: 139 mmol/L (ref 134–144)
Total Protein: 5.8 g/dL — ABNORMAL LOW (ref 6.0–8.5)
eGFR: 123 mL/min/{1.73_m2} (ref >59)

## 2021-10-13 LAB — GLUCOSE TOLERANCE, 2 HOURS W/ 1HR
Glucose, 1 hour: 179 mg/dL (ref 70–179)
Glucose, 2 hour: 165 mg/dL — ABNORMAL HIGH (ref 70–152)
Glucose, Fasting: 90 mg/dL (ref 70–91)

## 2021-10-13 LAB — CBC
Hematocrit: 31.8 % — ABNORMAL LOW (ref 34.0–46.6)
Hemoglobin: 10.9 g/dL — ABNORMAL LOW (ref 11.1–15.9)
MCH: 30.2 pg (ref 26.6–33.0)
MCHC: 34.3 g/dL (ref 31.5–35.7)
MCV: 88 fL (ref 79–97)
Platelets: 313 10*3/uL (ref 150–450)
RBC: 3.61 x10E6/uL — ABNORMAL LOW (ref 3.77–5.28)
RDW: 13 % (ref 11.7–15.4)
WBC: 13.4 10*3/uL — ABNORMAL HIGH (ref 3.4–10.8)

## 2021-10-13 LAB — RPR: RPR Ser Ql: NONREACTIVE

## 2021-10-13 LAB — HIV ANTIBODY (ROUTINE TESTING W REFLEX): HIV Screen 4th Generation wRfx: NONREACTIVE

## 2021-10-13 LAB — ANTIBODY SCREEN: Antibody Screen: NEGATIVE

## 2021-10-17 ENCOUNTER — Encounter: Payer: Self-pay | Admitting: Women's Health

## 2021-10-17 ENCOUNTER — Other Ambulatory Visit: Payer: Self-pay | Admitting: Women's Health

## 2021-10-17 DIAGNOSIS — O24419 Gestational diabetes mellitus in pregnancy, unspecified control: Secondary | ICD-10-CM | POA: Insufficient documentation

## 2021-10-17 DIAGNOSIS — Z8632 Personal history of gestational diabetes: Secondary | ICD-10-CM | POA: Insufficient documentation

## 2021-10-17 MED ORDER — FERROUS SULFATE 325 (65 FE) MG PO TABS: 325.0000 mg | ORAL_TABLET | ORAL | 2 refills | Status: DC

## 2021-10-18 ENCOUNTER — Telehealth: Payer: Self-pay | Admitting: Advanced Practice Midwife

## 2021-10-18 ENCOUNTER — Other Ambulatory Visit: Payer: Self-pay | Admitting: *Deleted

## 2021-10-18 ENCOUNTER — Telehealth: Payer: Self-pay | Admitting: *Deleted

## 2021-10-18 DIAGNOSIS — O24419 Gestational diabetes mellitus in pregnancy, unspecified control: Secondary | ICD-10-CM

## 2021-10-18 MED ORDER — ACCU-CHEK SOFTCLIX LANCETS MISC: 12 refills | Status: DC

## 2021-10-18 MED ORDER — ACCU-CHEK GUIDE VI STRP: ORAL_STRIP | 12 refills | Status: DC

## 2021-10-18 MED ORDER — ACCU-CHEK GUIDE ME W/DEVICE KIT
1.0000 | PACK | Freq: Four times a day (QID) | 0 refills | Status: DC
Start: 2021-10-18 — End: 2022-01-01

## 2021-10-18 NOTE — Telephone Encounter (Signed)
Left message @ 10:46 am. JSY

## 2021-10-18 NOTE — Telephone Encounter (Signed)
Patient's insurance won't cover diabetic supplies ordered. She called them and they said that Dexcom G5 & G6 are covered.

## 2021-10-18 NOTE — Telephone Encounter (Signed)
Pt reviewed MyChart message. Closing this encounter. [place]

## 2021-10-19 MED ORDER — DEXCOM G6 TRANSMITTER MISC: 1.0000 | 3 refills | Status: DC

## 2021-10-19 MED ORDER — DEXCOM G6 SENSOR MISC: 1.0000 | 11 refills | Status: AC

## 2021-10-19 MED ORDER — DEXCOM G6 RECEIVER DEVI
1.0000 | 0 refills | Status: DC
Start: 2021-10-19 — End: 2021-12-14

## 2021-10-19 NOTE — Telephone Encounter (Signed)
Dexcom G6 sent to pharmacy.

## 2021-10-31 ENCOUNTER — Ambulatory Visit (INDEPENDENT_AMBULATORY_CARE_PROVIDER_SITE_OTHER): Payer: Medicaid Other | Admitting: Registered"

## 2021-10-31 ENCOUNTER — Other Ambulatory Visit: Payer: Self-pay

## 2021-10-31 ENCOUNTER — Encounter: Payer: Medicaid Other | Attending: Women's Health | Admitting: Registered"

## 2021-10-31 DIAGNOSIS — O24419 Gestational diabetes mellitus in pregnancy, unspecified control: Secondary | ICD-10-CM | POA: Insufficient documentation

## 2021-10-31 DIAGNOSIS — Z3A00 Weeks of gestation of pregnancy not specified: Secondary | ICD-10-CM | POA: Insufficient documentation

## 2021-10-31 NOTE — Progress Notes (Signed)
Patient was seen for Gestational Diabetes self-management on 11/02/2021  Start time 1520 and End time 1617   Estimated due date: 12/31/21; [redacted]w[redacted]d  Clinical: Medications: reviewed Medical History: reviewed Labs: OGTT 2 hr 165, A1c n/a %   Dietary and Lifestyle History: Pt states she has trying to reduce of sugar and bread. Pt states she only drinks a little orange juice, just enough to take iron pill. Pt states sometimes as nausea in evening has has cracker to help reduce symptoms.  Physical Activity: not assessed Stress: not assessed Sleep: not assessed  24 hr Recall:  First Meal: scrambled eggs with bagel or fruit, 1/4 c orange juice (to take iron) Snack: sugar-free cookies Second meal: spaghetti Snack:popcorn Third meal: chicken, potatoes, green beans, corn, macaroni Snack: none or apple or crackers Beverages: water  NUTRITION INTERVENTION  Nutrition education (E-1) on the following topics:   Initial Follow-up  [x]  []  Definition of Gestational Diabetes [x]  []  Why dietary management is important in controlling blood glucose [x]  []  Effects each nutrient has on blood glucose levels [x]  []  Simple carbohydrates vs complex carbohydrates [x]  []  Fluid intake [x]  []  Creating a balanced meal plan [x]  []  Carbohydrate counting  [x]  []  When to check blood glucose levels [x]  []  Proper blood glucose monitoring techniques [x]  []  Effect of stress and stress reduction techniques  [x]  []  Exercise effect on blood glucose levels, appropriate exercise during pregnancy [x]  []  Importance of limiting caffeine and abstaining from alcohol and smoking [x]  []  Medications used for blood sugar control during pregnancy [x]  []  Hypoglycemia and rule of 15 [x]  []  Postpartum self care  Patient already has a meter, is testing pre breakfast  FBS: 102-113 mg/dL  Patient instructed to monitor glucose levels: FBS: 60 - ? 95 mg/dL (some clinics use 90 for cutoff) 1 hour: ? 140 mg/dL 2 hour: ?  mg/dL  Patient received handouts: Nutrition Diabetes and Pregnancy Carbohydrate Counting List  Patient will be seen for follow-up as needed.

## 2021-11-02 ENCOUNTER — Telehealth (INDEPENDENT_AMBULATORY_CARE_PROVIDER_SITE_OTHER): Payer: Medicaid Other | Admitting: Women's Health

## 2021-11-02 ENCOUNTER — Encounter: Payer: Self-pay | Admitting: Women's Health

## 2021-11-02 VITALS — BP 119/76 | HR 89 | Wt 187.0 lb

## 2021-11-02 DIAGNOSIS — F1120 Opioid dependence, uncomplicated: Secondary | ICD-10-CM

## 2021-11-02 DIAGNOSIS — O34219 Maternal care for unspecified type scar from previous cesarean delivery: Secondary | ICD-10-CM

## 2021-11-02 DIAGNOSIS — Z3A31 31 weeks gestation of pregnancy: Secondary | ICD-10-CM

## 2021-11-02 DIAGNOSIS — O0993 Supervision of high risk pregnancy, unspecified, third trimester: Secondary | ICD-10-CM

## 2021-11-02 DIAGNOSIS — Z3480 Encounter for supervision of other normal pregnancy, unspecified trimester: Secondary | ICD-10-CM

## 2021-11-02 DIAGNOSIS — O24415 Gestational diabetes mellitus in pregnancy, controlled by oral hypoglycemic drugs: Secondary | ICD-10-CM

## 2021-11-02 DIAGNOSIS — O09293 Supervision of pregnancy with other poor reproductive or obstetric history, third trimester: Secondary | ICD-10-CM

## 2021-11-02 DIAGNOSIS — O99323 Drug use complicating pregnancy, third trimester: Secondary | ICD-10-CM

## 2021-11-02 MED ORDER — METFORMIN HCL 500 MG PO TABS: 500.0000 mg | ORAL_TABLET | Freq: Two times a day (BID) | ORAL | 3 refills | Status: DC

## 2021-11-02 NOTE — Progress Notes (Signed)
TELEHEALTH VIRTUAL OBSTETRICS VISIT ENCOUNTER NOTE Patient name: Bonnie Weaver MRN [id number]  Date of birth: [birth year]  I connected with patient on 11/02/21 at  3:50 PM EST by MyChart video  and verified that I am speaking with the correct person using two identifiers. Pt is not currently in our office, she is at home.  The provider is in the office.    I discussed the limitations, risks, security and privacy concerns of performing an evaluation and management service by telephone and the availability of in person appointments. I also discussed with the patient that there may be a patient responsible charge related to this service. The patient expressed understanding and agreed to proceed.  Chief Complaint:   Routine Prenatal Visit (Having pelvic pain)  History of Present Illness:   Bonnie Weaver is a 36 y.o. G59P1011 female at [redacted]w[redacted]d with an Estimated Date of Delivery: 12/31/21 being evaluated today for ongoing management of a high-risk pregnancy.  Depression screen [facility] 2/9 10/12/2021 06/27/2021 05/16/2021  Decreased Interest 0 0 0  Down, Depressed, Hopeless 0 0 0  PHQ - 2 Score 0 0 0  Altered sleeping 1 1 -  Tired, decreased energy 2 2 -  Change in appetite 0 0 -  Feeling bad or failure about yourself  0 0 -  Trouble concentrating 1 1 -  Moving slowly or fidgety/restless 0 0 -  Suicidal thoughts 0 0 -  PHQ-9 Score 4 4 -    Today she reports  fbs 100-128, 2hr pp 85-156 (majority >120) . Contractions: Not present. Vag. Bleeding: None.  Movement: Present. denies leaking of fluid. Review of Systems:   Pertinent items are noted in HPI Denies abnormal vaginal discharge w/ itching/odor/irritation, headaches, visual changes, shortness of breath, chest pain, abdominal pain, severe nausea/vomiting, or problems with urination or bowel movements unless otherwise stated above. Pertinent History Reviewed:  Reviewed past medical,surgical, social, obstetrical and family history.  Reviewed  problem list, medications and allergies. Physical Assessment:   Vitals:   11/02/21 1547  BP: 119/76  Pulse: 89  Weight: 187 lb (84.8 kg)  Body mass index is 34.2 kg/m.        Physical Examination:   General:  Alert, oriented and cooperative.   Mental Status: Normal mood and affect perceived. Normal judgment and thought content.  Rest of physical exam deferred due to type of encounter  No results found for this or any previous visit (from the past 24 hour(s)).  Assessment & Plan:  1) Pregnancy G3P1011 at [redacted]w[redacted]d with an Estimated Date of Delivery: 12/31/21   2) A2DM, rx metformin 500mg  BID today,  Growth u/s q4wks    2x/wk testing @ 32wks or weekly BPP    Deliver @ 39-39.6wks:______   3) Prev c/s> wants RCS  4) H/O pre-e> bp wnl  5) Suboxone therapy   Meds:  Meds ordered this encounter  Medications   metFORMIN (GLUCOPHAGE) 500 MG tablet    Sig: Take 1 tablet (500 mg total) by mouth 2 (two) times daily with a meal.    Dispense:  60 tablet    Refill:  3    Order Specific Question:   Supervising Provider    Answer:   Florian Buff [[id number]]    Labs/procedures today: none  Plan:  Continue routine obstetrical care.  Does have home bp cuff. Office bp cuff given: not applicable. Check bp weekly, let us know if consistently >140 and/or >90.  Next visit: prefers will be in  person for efw/bpp u/s     Reviewed: Preterm labor symptoms and general obstetric precautions including but not limited to vaginal bleeding, contractions, leaking of fluid and fetal movement were reviewed in detail with the patient. The patient was advised to call back or seek an in-person office evaluation/go to [facility] at [facility] for any urgent or concerning symptoms. All questions were answered. Please refer to After Visit Summary for other counseling recommendations.    I provided 20 minutes of non-face-to-face time during this encounter.  Follow-up: Return in about 4 days (around 11/06/2021)  for efw/bpp w/ HROB MD/CNM (if no u/s, please do 2x/wk NST one w/ HROB, one w/ nurse), efw u/s q4wk.  No orders of the defined types were placed in this encounter.  [place], [place] 11/02/2021 4:16 PM

## 2021-11-02 NOTE — Patient Instructions (Signed)
Bonnie Weaver, thank you for choosing our office today! We appreciate the opportunity to meet your healthcare needs. You may receive a short survey by mail, e-mail, or through MyChart. If you are happy with your care we would appreciate if you could take just a few minutes to complete the survey questions. We read all of your comments and take your feedback very seriously. Thank you again for choosing our office.  [facility]  [facility] ([address] [place], [place] [address]) Entrance C, located off of [address] Free 24/7 valet parking   CLASSES: Go to [email or web address] to register for classes (childbirth, breastfeeding, waterbirth, infant CPR, daddy bootcamp, etc.)  Call the office ([phone]) or go to [facility] if: You begin to have strong, frequent contractions Your water breaks.  Sometimes it is a big gush of fluid, sometimes it is just a trickle that keeps getting your panties wet or running down your legs You have vaginal bleeding.  It is normal to have a small amount of spotting if your cervix was checked.  You don't feel your baby moving like normal.  If you don't, get you something to eat and drink and lay down and focus on feeling your baby move.   If your baby is still not moving like normal, you should call the office or go to [facility].  Call the office ([phone]) or go to [facility] for these signs of pre-eclampsia: Severe headache that does not go away with Tylenol Visual changes- seeing spots, double, blurred vision Pain under your right breast or upper abdomen that does not go away with Tums or heartburn medicine Nausea and/or vomiting Severe swelling in your hands, feet, and face   Tdap Vaccine It is recommended that you get the Tdap vaccine during the third trimester of EACH pregnancy to help protect your baby from getting pertussis (whooping cough) 27-36 weeks is the BEST time to do  this so that you can pass the protection on to your baby. During pregnancy is better than after pregnancy, but if you are unable to get it during pregnancy it will be offered at the hospital.  You can get this vaccine with us, at the health department, your family doctor, or some local pharmacies Everyone who will be around your baby should also be up-to-date on their vaccines before the baby comes. Adults (who are not pregnant) only need 1 dose of Tdap during adulthood.   [place] Pediatricians/Family Doctors [facility] ([facility]): [address], [phone]           [facility]: [address], [phone]                [facility] ([facility]): [address], [phone] (call to ask if accepting patients) [facility]: [address], [place], [phone]    [place] Pediatricians/Family Doctors [facility] ([facility]): [address], [phone] [facility]: [address], [phone] [facility]: [address], [phone]  [place] [facility]  [facility] ([facility]): [phone] [facility]: [address], [phone]   [place] [facility]: [address], [phone]  [place] [facility]  [facility]: [address], [phone]  Home Blood Pressure Monitoring for Patients   Your provider has recommended that you check your   blood pressure (BP) at least once a week at home. If you do not have a blood pressure cuff at home, one will be provided for you. Contact your provider if you have not received your monitor within 1 week.  ° °Helpful Tips for Accurate Home Blood Pressure Checks  °Don't smoke, exercise, or drink caffeine 30 minutes before checking your BP °Use the restroom before checking your BP (a full bladder can raise your  pressure) °Relax in a comfortable upright chair °Feet on the ground °Left arm resting comfortably on a flat surface at the level of your heart °Legs uncrossed °Back supported °Sit quietly and don't talk °Place the cuff on your bare arm °Adjust snuggly, so that only two fingertips can fit between your skin and the top of the cuff °Check 2 readings separated by at least one minute °Keep a log of your BP readings °For a visual, please reference this diagram: http://ccnc.care/bpdiagram ° °Provider Name: [facility]     Phone: [phone] ° °Zone 1: ALL CLEAR  °Continue to monitor your symptoms:  °BP reading is less than 140 (top number) or less than 90 (bottom number)  °No right upper stomach pain °No headaches or seeing spots °No feeling nauseated or throwing up °No swelling in face and hands ° °Zone 2: CAUTION °Call your doctor's office for any of the following:  °BP reading is greater than 140 (top number) or greater than 90 (bottom number)  °Stomach pain under your ribs in the middle or right side °Headaches or seeing spots °Feeling nauseated or throwing up °Swelling in face and hands ° °Zone 3: EMERGENCY  °Seek immediate medical care if you have any of the following:  °BP reading is greater than160 (top number) or greater than 110 (bottom number) °Severe headaches not improving with Tylenol °Serious difficulty catching your breath °Any worsening symptoms from Zone 2  °Preterm Labor and Birth Information ° °The normal length of a pregnancy is 39-41 weeks. Preterm labor is when labor starts before 37 completed weeks of pregnancy. °What are the risk factors for preterm labor? °Preterm labor is more likely to occur in women who: °Have certain infections during pregnancy such as a bladder infection, sexually transmitted infection, or infection inside the uterus (chorioamnionitis). °Have a shorter-than-normal cervix. °Have gone into preterm labor before. °Have had surgery on their cervix. °Are younger than age 17  or older than age 35. °Are African American. °Are pregnant with twins or multiple babies (multiple gestation). °Take street drugs or smoke while pregnant. °Do not gain enough weight while pregnant. °Became pregnant shortly after having been pregnant. °What are the symptoms of preterm labor? °Symptoms of preterm labor include: °Cramps similar to those that can happen during a menstrual period. The cramps may happen with diarrhea. °Pain in the abdomen or lower back. °Regular uterine contractions that may feel like tightening of the abdomen. °A feeling of increased pressure in the pelvis. °Increased watery or bloody mucus discharge from the vagina. °Water breaking (ruptured amniotic sac). °Why is it important to recognize signs of preterm labor? °It is important to recognize signs of preterm labor because babies who are born prematurely may not be fully developed. This can put them at an increased risk for: °Long-term (chronic) heart and lung problems. °Difficulty immediately after birth with regulating body systems, including blood sugar, body temperature, heart rate, and breathing rate. °Bleeding in the brain. °Cerebral palsy. °Learning difficulties. °Death. °These risks are highest for babies who are born before 34 weeks   of pregnancy. How is preterm labor treated? Treatment depends on the length of your pregnancy, your condition, and the health of your baby. It may involve: Having a stitch (suture) placed in your cervix to prevent your cervix from opening too early (cerclage). Taking or being given medicines, such as: Hormone medicines. These may be given early in pregnancy to help support the pregnancy. Medicine to stop contractions. Medicines to help mature the babys lungs. These may be prescribed if the risk of delivery is high. Medicines to prevent your baby from developing cerebral palsy. If the labor happens before 34 weeks of pregnancy, you may need to stay in the hospital. What should I do if I  think I am in preterm labor? If you think that you are going into preterm labor, call your health care provider right away. How can I prevent preterm labor in future pregnancies? To increase your chance of having a full-term pregnancy: Do not use any tobacco products, such as cigarettes, chewing tobacco, and e-cigarettes. If you need help quitting, ask your health care provider. Do not use street drugs or medicines that have not been prescribed to you during your pregnancy. Talk with your health care provider before taking any herbal supplements, even if you have been taking them regularly. Make sure you gain a healthy amount of weight during your pregnancy. Watch for infection. If you think that you might have an infection, get it checked right away. Make sure to tell your health care provider if you have gone into preterm labor before. This information is not intended to replace advice given to you by your health care provider. Make sure you discuss any questions you have with your health care provider. Document Revised: 01/23/2019 Document Reviewed: 02/22/2016 Elsevier Patient Education  [place].

## 2021-11-06 ENCOUNTER — Other Ambulatory Visit: Payer: Self-pay

## 2021-11-06 ENCOUNTER — Ambulatory Visit (INDEPENDENT_AMBULATORY_CARE_PROVIDER_SITE_OTHER): Payer: Medicaid Other | Admitting: *Deleted

## 2021-11-06 VITALS — BP 120/77 | HR 92 | Wt 181.4 lb

## 2021-11-06 DIAGNOSIS — Z3A32 32 weeks gestation of pregnancy: Secondary | ICD-10-CM | POA: Diagnosis not present

## 2021-11-06 DIAGNOSIS — O24415 Gestational diabetes mellitus in pregnancy, controlled by oral hypoglycemic drugs: Secondary | ICD-10-CM

## 2021-11-06 DIAGNOSIS — Z1389 Encounter for screening for other disorder: Secondary | ICD-10-CM | POA: Diagnosis not present

## 2021-11-06 DIAGNOSIS — Z331 Pregnant state, incidental: Secondary | ICD-10-CM

## 2021-11-06 DIAGNOSIS — Z3480 Encounter for supervision of other normal pregnancy, unspecified trimester: Secondary | ICD-10-CM | POA: Diagnosis not present

## 2021-11-06 DIAGNOSIS — O288 Other abnormal findings on antenatal screening of mother: Secondary | ICD-10-CM

## 2021-11-06 LAB — POCT URINALYSIS DIPSTICK OB
Blood, UA: NEGATIVE
Glucose, UA: NEGATIVE
Ketones, UA: NEGATIVE
Leukocytes, UA: NEGATIVE
Nitrite, UA: NEGATIVE
POC,PROTEIN,UA: NEGATIVE

## 2021-11-06 NOTE — Progress Notes (Addendum)
° °  NURSE VISIT- NST  SUBJECTIVE:  Bonnie Weaver is a 36 y.o. G49P1011 female at [redacted]w[redacted]d, here for a NST for pregnancy complicated by [id number] currently on Metformin .  She reports active fetal movement, contractions: none, vaginal bleeding: none, membranes: intact.   OBJECTIVE:  BP 120/77    Pulse 92    Wt 181 lb 6.4 oz (82.3 kg)    LMP 03/26/2021 (Approximate)    BMI 33.18 kg/m   Appears well, no apparent distress  Results for orders placed or performed in visit on 11/06/21 (from the past 24 hour(s))  POC Urinalysis Dipstick OB   Collection Time: 11/06/21  3:58 PM  Result Value Ref Range   Color, UA     Clarity, UA     Glucose, UA Negative Negative   Bilirubin, UA     Ketones, UA neg    Spec Grav, UA     Blood, UA neg    pH, UA     POC,PROTEIN,UA Negative Negative, Trace, Small (1+), Moderate (2+), Large (3+), 4+   Urobilinogen, UA     Nitrite, UA neg    Leukocytes, UA Negative Negative   Appearance     Odor      NST: FHR baseline 130 bpm, Variability: moderate, Accelerations:present, Decelerations:  Absent= Cat 1/reactive Toco: none   ASSESSMENT: G3P1011 at [redacted]w[redacted]d with A2DM currently on Metformin NST reactive  PLAN: EFM strip reviewed by Knute Neu, CNM, [facility]   Recommendations: keep next appointment as scheduled Patient requesting refill on Phenergan    Alice Rieger  11/06/2021 4:51 PM  Chart reviewed for nurse visit. Agree with plan of care.  Roma Schanz, [place] 11/07/2021 11:53 AM

## 2021-11-08 ENCOUNTER — Other Ambulatory Visit: Payer: Self-pay | Admitting: Obstetrics & Gynecology

## 2021-11-08 ENCOUNTER — Encounter: Payer: Self-pay | Admitting: Women's Health

## 2021-11-08 MED ORDER — PROMETHAZINE HCL 25 MG PO TABS: 25.0000 mg | ORAL_TABLET | Freq: Four times a day (QID) | ORAL | 1 refills | Status: DC | PRN

## 2021-11-09 ENCOUNTER — Encounter: Payer: Self-pay | Admitting: Obstetrics & Gynecology

## 2021-11-09 ENCOUNTER — Other Ambulatory Visit: Payer: Self-pay

## 2021-11-09 ENCOUNTER — Ambulatory Visit (INDEPENDENT_AMBULATORY_CARE_PROVIDER_SITE_OTHER): Payer: Medicaid Other | Admitting: Obstetrics & Gynecology

## 2021-11-09 VITALS — BP 113/72 | HR 88 | Wt 183.0 lb

## 2021-11-09 DIAGNOSIS — O0993 Supervision of high risk pregnancy, unspecified, third trimester: Secondary | ICD-10-CM

## 2021-11-09 DIAGNOSIS — Z3A32 32 weeks gestation of pregnancy: Secondary | ICD-10-CM

## 2021-11-09 LAB — POCT URINALYSIS DIPSTICK OB
Blood, UA: NEGATIVE
Glucose, UA: NEGATIVE
Ketones, UA: NEGATIVE
Leukocytes, UA: NEGATIVE
Nitrite, UA: NEGATIVE

## 2021-11-09 NOTE — Progress Notes (Signed)
HIGH-RISK PREGNANCY VISIT Patient name: Bonnie Weaver MRN [id number]  Date of birth: 15-May-1986 Chief Complaint:   High Risk Gestation (NST today; pain after urinating)  History of Present Illness:   Bonnie Weaver is a 37 y.o. 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    Today she reports no complaints.   Contractions: Not present. Vag. Bleeding: None.  Movement: Present. denies leaking of fluid.   [facility] 10/12/2021 06/27/2021 05/16/2021  Decreased Interest 0 0 0  Down, Depressed, Hopeless 0 0 0  PHQ - 2 Score 0 0 0  Altered sleeping 1 1 -  Tired, decreased energy 2 2 -  Change in appetite 0 0 -  Feeling bad or failure about yourself  0 0 -  Trouble concentrating 1 1 -  Moving slowly or fidgety/restless 0 0 -  Suicidal thoughts 0 0 -  PHQ-9 Score 4 4 -     Current Outpatient Medications  Medication Instructions   Accu-Chek Softclix Lancets lancets Check blood sugar four times daily.   Acetaminophen (TYLENOL PO) Oral   acyclovir (ZOVIRAX) 800 MG tablet Daily   albuterol (VENTOLIN HFA) 108 (90 Base) MCG/ACT inhaler As needed   ARIPiprazole (ABILIFY) 15 mg, Oral, Daily, Patient taking 8m21maspirin 162 mg, Oral, Daily, Swallow whole.   Blood Glucose Monitoring Suppl (ACCU-CHEK GUIDE ME) w/Device KIT 1 kit, Does not apply, 4 times daily   Blood Pressure Monitor MISC For regular home bp monitoring during pregnancy   buprenorphine (SUBUTEX) 8 MG SUBL SL tablet Sublingual   Continuous Blood Gluc Receiver (DEXCOM G6 RECEIVER) DEVI 1 kit, Does not apply, As directed   Continuous Blood Gluc Sensor (DEXCOM G6  SENSOR) MISC 1 kit, Does not apply, As directed   Continuous Blood Gluc Transmit (DEXCOM G6 TRANSMITTER) MISC 1 Device, Does not apply, Continuous   Doxylamine-Pyridoxine (DICLEGIS) 10-10 MG TBEC Take 2 qhs; may also take one in am and one in afternoon prn nausea   ferrous sulfate 325 mg, Oral, Every other day   glucose blood (ACCU-CHEK GUIDE) test strip Check blood sugar four times a day.   lamoTRIgine (LAMICTAL) 100 mg, Oral, 2 times daily, Patient taking 150mg13metFORMIN (GLUCOPHAGE) 500 mg, Oral, 2 times daily with meals   omeprazole (PRILOSEC) 20 MG capsule 1 capsule PRN   ondansetron (ZOFRAN-ODT) 4 mg, Oral, Every 6 hours PRN   Polyethylene Glycol 3350 (PEG 3350) 17 g PACK 1 packet, Oral   Prenatal Vit-Fe Fumarate-FA (PRENATAL VITAMIN PO) Oral   promethazine (PHENERGAN) 25 mg, Oral, Every 6 hours PRN   Trintellix 10 mg, Oral, Daily     Review of Systems:   Pertinent items are noted in HPI Denies abnormal vaginal discharge w/ itching/odor/irritation, headaches, visual changes, shortness of breath, chest pain, abdominal pain, severe nausea/vomiting, or problems with urination or bowel movements unless otherwise stated above. Pertinent History Reviewed:  Reviewed past medical,surgical, social, obstetrical and family history.  Reviewed problem list, medications and allergies. Physical Assessment:   Vitals:   11/09/21 1553  BP: 113/72  Pulse: 88  Weight: 183 lb (83 kg)  Body mass index is 33.47 kg/m.  Physical Examination:   General appearance: alert, well appearing, and in no distress  Mental status: normal mood, behavior, speech, dress, motor activity, and thought processes  Skin: warm & dry   Extremities: Edema: None    Cardiovascular: normal heart rate noted  Respiratory: normal respiratory effort, no distress  Abdomen: gravid, soft, non-tender  Pelvic: Cervical exam deferred         Fetal Status:     Movement: Present    Fetal Surveillance Testing today:  NST  NST being performed due to GMDA2   Fetal Monitoring:  Baseline: 130 bpm, Variability: moderate, Accelerations: present, The accelerations are >15 bpm and more than 2 in 20 minutes, and Decelerations: Absent   reactive   Final diagnosis:  Reactive NST      Chaperone: N/A    Results for orders placed or performed in visit on 11/09/21 (from the past 24 hour(s))  POC Urinalysis Dipstick OB   Collection Time: 11/09/21  3:49 PM  Result Value Ref Range   Color, UA     Clarity, UA     Glucose, UA Negative Negative   Bilirubin, UA     Ketones, UA neg    Spec Grav, UA     Blood, UA neg    pH, UA     POC,PROTEIN,UA Trace Negative, Trace, Small (1+), Moderate (2+), Large (3+), 4+   Urobilinogen, UA     Nitrite, UA neg    Leukocytes, UA Negative Negative   Appearance     Odor       Assessment & Plan:  High-risk pregnancy: G3P1011 at 84w4dwith an Estimated Date of Delivery: 12/31/21   1) GDMA2 -no change, may need to increase metformin pending fasting sugars next visit -continue antepartum testing twice weekly and growth every 4wks  2) Prior C-section- desires repeat- referral sent to schedule for 39wk  3) Anxiety/Dep/Anxiety- stable 4) SUD- stable    Meds: No orders of the defined types were placed in this encounter.   Labs/procedures today: NST reactive  Treatment Plan:  as outlined above  Reviewed: Preterm labor symptoms and general obstetric precautions including but not limited to vaginal bleeding, contractions, leaking of fluid and fetal movement were reviewed in detail with the patient.  All questions were answered. Pt has home bp cuff. Check bp weekly, let [place]now if >140/90.   Follow-up: Return for HROB visit twice weekly (scheduled).   Future Appointments  Date Time Provider DLawrence 11/09/2021  3:50 PM OJanyth Pupa DO [facility]  11/13/2021 10:50 AM [facility] NURSE [facility]  11/16/2021  2:10 PM [facility] NURSE [facility]   11/16/2021  3:10 PM EFlorian Buff MD [facility]  11/20/2021 10:50 AM [facility] NURSE [facility]  11/23/2021 10:30 AM OJanyth Pupa DO [facility]  11/27/2021 10:50 AM [facility] NURSE [facility]  11/30/2021  9:30 AM [facility] NURSE [facility]  11/30/2021 10:10 AM BRoma Schanz CNM [facility]  12/05/2021  3:45 PM [place] [facility] [place] None  12/05/2021  4:30 PM BRoma Schanz CNM [facility]  12/11/2021 10:50 AM [facility] NURSE [facility]  12/14/2021  9:50 AM EFlorian Buff MD [facility]  12/18/2021  2:30 PM [facility] NURSE [facility]  12/21/2021  9:50 AM OJanyth Pupa DO [facility]  12/25/2021 10:10 AM [facility] NURSE [facility]  12/28/2021  9:50 AM BRoma Schanz CNM [facility]    Orders Placed This Encounter  Procedures   POC Urinalysis Dipstick OB  Janyth Pupa, DO Attending [place], [facility] for [employer], [place]

## 2021-11-13 ENCOUNTER — Other Ambulatory Visit: Payer: Medicaid Other

## 2021-11-16 ENCOUNTER — Other Ambulatory Visit: Payer: Medicaid Other

## 2021-11-16 ENCOUNTER — Other Ambulatory Visit: Payer: Self-pay

## 2021-11-16 ENCOUNTER — Ambulatory Visit (INDEPENDENT_AMBULATORY_CARE_PROVIDER_SITE_OTHER): Payer: Medicaid Other | Admitting: Obstetrics & Gynecology

## 2021-11-16 VITALS — BP 134/80 | HR 101 | Wt 180.0 lb

## 2021-11-16 DIAGNOSIS — Z3A33 33 weeks gestation of pregnancy: Secondary | ICD-10-CM

## 2021-11-16 DIAGNOSIS — O24415 Gestational diabetes mellitus in pregnancy, controlled by oral hypoglycemic drugs: Secondary | ICD-10-CM

## 2021-11-16 DIAGNOSIS — O0993 Supervision of high risk pregnancy, unspecified, third trimester: Secondary | ICD-10-CM

## 2021-11-16 NOTE — Progress Notes (Signed)
HIGH-RISK PREGNANCY VISIT Patient name: Bonnie Weaver MRN [id number]  Date of birth: 1986/04/16 Chief Complaint:   Routine Prenatal Visit (Nst/)  History of Present Illness:   Bonnie Weaver is a 36 y.o. G29P1011 female at [redacted]w[redacted]d with an Estimated Date of Delivery: 12/31/21 being seen today for ongoing management of a high-risk pregnancy complicated by Class A2 DM well controlled, new episodic fetal bradyarrythmia with reassuring monitoring and very active, no evidence of failure/hydrops.    Today she reports no complaints. Contractions: Not present. Vag. Bleeding: None.  Movement: Present. denies leaking of fluid.   Depression screen [facility] 2/9 10/12/2021 06/27/2021 05/16/2021  Decreased Interest 0 0 0  Down, Depressed, Hopeless 0 0 0  PHQ - 2 Score 0 0 0  Altered sleeping 1 1 -  Tired, decreased energy 2 2 -  Change in appetite 0 0 -  Feeling bad or failure about yourself  0 0 -  Trouble concentrating 1 1 -  Moving slowly or fidgety/restless 0 0 -  Suicidal thoughts 0 0 -  PHQ-9 Score 4 4 -     GAD 7 : Generalized Anxiety Score 10/12/2021 06/27/2021  Nervous, Anxious, on Edge 2 1  Control/stop worrying 0 1  Worry too much - different things 1 0  Trouble relaxing 0 0  Restless 0 0  Easily annoyed or irritable 1 2  Afraid - awful might happen 0 1  Total GAD 7 Score 4 5     Review of Systems:   Pertinent items are noted in HPI Denies abnormal vaginal discharge w/ itching/odor/irritation, headaches, visual changes, shortness of breath, chest pain, abdominal pain, severe nausea/vomiting, or problems with urination or bowel movements unless otherwise stated above. Pertinent History Reviewed:  Reviewed past medical,surgical, social, obstetrical and family history.  Reviewed problem list, medications and allergies. Physical Assessment:   Vitals:   11/16/21 1426  BP: 134/80  Pulse: (!) 101  Weight: 180 lb (81.6 kg)  Body mass index is 32.92 kg/m.           Physical  Examination:   General appearance: alert, well appearing, and in no distress  Mental status: alert, oriented to person, place, and time  Skin: warm & dry   Extremities: Edema: None    Cardiovascular: normal heart rate noted  Respiratory: normal respiratory effort, no distress  Abdomen: gravid, soft, non-tender  Pelvic: Cervical exam deferred         Fetal Status:     Movement: Present    Fetal Surveillance Testing today: Reactive NST  Bonnie Weaver is at [redacted]w[redacted]d Estimated Date of Delivery: 12/31/21  NST being performed due to A2DM  Today the NST is Reactive  Fetal Monitoring:  Baseline: 150 bpm, Variability: Good {> 6 bpm), Accelerations: Reactive, and Decelerations: see notes    reactive  The accelerations are >15 bpm and more than 2 in 20 minutes  Final diagnosis:  Reactive NST  Lazaro Arms, MD     Chaperone: N/A    No results found for this or any previous visit (from the past 24 hour(s)).  Assessment & Plan:  High-risk pregnancy: G3P1011 at [redacted]w[redacted]d with an Estimated Date of Delivery: 12/31/21   1) Class A2DM well controlled on the metformin 500 BID, stable  2) New Fetal brady arrythmia,intermittent, M mode performed and appears to be non propogated ectopic atrial beats.  This is not A fib or A flutter, no sonographic findings consistent with failure, no ascites, pleural effusion, and  baby is extremely active, scheduled for fetal ECHO 11/21/21 at 0800, pt is aware and may completely resolve spontaneously  Meds: No orders of the defined types were placed in this encounter.   Labs/procedures today: NST  Treatment Plan:  keep surveillance appts here, fetal ECHO is scheduled  Reviewed: Preterm labor symptoms and general obstetric precautions including but not limited to vaginal bleeding, contractions, leaking of fluid and fetal movement were reviewed in detail with the patient.  All questions were answered. Does have home bp cuff. Office bp cuff given: not applicable. Check  bp daily, let us know if consistently >140 and/or >90.  Follow-up: No follow-ups on file.   Future Appointments  Date Time Provider [place]  11/20/2021 10:50 AM [facility] NURSE [facility]  11/23/2021 10:30 AM Janyth Pupa, DO [facility]  11/27/2021 10:50 AM [facility] NURSE [facility]  11/30/2021  9:30 AM [facility] NURSE [facility]  11/30/2021 10:10 AM Roma Schanz, CNM [facility]  12/05/2021  3:45 PM [place] - [facility] [place] [facility] None  12/05/2021  4:30 PM Roma Schanz, CNM [facility]  12/11/2021 10:50 AM [facility] NURSE [facility]  12/14/2021  9:50 AM Florian Buff, MD [facility]  12/18/2021  2:30 PM [facility] NURSE [facility]  12/21/2021  9:50 AM Janyth Pupa, DO [facility]  12/25/2021 10:10 AM [facility] NURSE [facility]  12/28/2021  9:50 AM Gertie Exon, Royetta Crochet, CNM [facility]    Orders Placed This Encounter  Procedures   POC Urinalysis Dipstick OB   Florian Buff  Attending Physician for the [facility] [place] [facility] 11/16/2021 3:31 PM

## 2021-11-20 ENCOUNTER — Ambulatory Visit (INDEPENDENT_AMBULATORY_CARE_PROVIDER_SITE_OTHER): Payer: Medicaid Other | Admitting: *Deleted

## 2021-11-20 ENCOUNTER — Other Ambulatory Visit: Payer: Self-pay

## 2021-11-20 VITALS — BP 113/74 | HR 100 | Wt 182.0 lb

## 2021-11-20 DIAGNOSIS — Z3A34 34 weeks gestation of pregnancy: Secondary | ICD-10-CM

## 2021-11-20 DIAGNOSIS — O0993 Supervision of high risk pregnancy, unspecified, third trimester: Secondary | ICD-10-CM | POA: Diagnosis not present

## 2021-11-20 DIAGNOSIS — Z1389 Encounter for screening for other disorder: Secondary | ICD-10-CM

## 2021-11-20 DIAGNOSIS — O24415 Gestational diabetes mellitus in pregnancy, controlled by oral hypoglycemic drugs: Secondary | ICD-10-CM

## 2021-11-20 DIAGNOSIS — O288 Other abnormal findings on antenatal screening of mother: Secondary | ICD-10-CM

## 2021-11-20 DIAGNOSIS — Z331 Pregnant state, incidental: Secondary | ICD-10-CM

## 2021-11-20 LAB — POCT URINALYSIS DIPSTICK OB
Blood, UA: NEGATIVE
Glucose, UA: NEGATIVE
Ketones, UA: NEGATIVE
Leukocytes, UA: NEGATIVE
Nitrite, UA: NEGATIVE
POC,PROTEIN,UA: NEGATIVE

## 2021-11-20 NOTE — Progress Notes (Signed)
° °  NURSE VISIT- NST  SUBJECTIVE:  Bonnie Weaver is a 36 y.o. G42P1011 female at [redacted]w[redacted]d, here for a NST for pregnancy complicated by A2DM currently on Metformin .  She reports active fetal movement, contractions: none, vaginal bleeding: none, membranes: intact.   OBJECTIVE:  BP 113/74    Pulse 100    Wt 182 lb (82.6 kg)    LMP 03/26/2021 (Approximate)    BMI 33.29 kg/m   Appears well, no apparent distress  Results for orders placed or performed in visit on 11/20/21 (from the past 24 hour(s))  POC Urinalysis Dipstick OB   Collection Time: 11/20/21 11:43 AM  Result Value Ref Range   Color, UA     Clarity, UA     Glucose, UA Negative Negative   Bilirubin, UA     Ketones, UA neg    Spec Grav, UA     Blood, UA neg    pH, UA     POC,PROTEIN,UA Negative Negative, Trace, Small (1+), Moderate (2+), Large (3+), 4+   Urobilinogen, UA     Nitrite, UA neg    Leukocytes, UA Negative Negative   Appearance     Odor      NST: FHR baseline 145 bpm, Variability: moderate, Accelerations:present, Decelerations:  Absent= Cat 1/reactive Toco: none   ASSESSMENT: G3P1011 at [redacted]w[redacted]d with A2DM currently on metformin NST reactive  PLAN: EFM strip reviewed by Dr. Charlotta Newton   Recommendations: keep next appointment as scheduled    Jobe Marker  11/20/2021 12:30 PM

## 2021-11-23 ENCOUNTER — Other Ambulatory Visit (HOSPITAL_COMMUNITY)
Admission: RE | Admit: 2021-11-23 | Discharge: 2021-11-23 | Disposition: A | Discharge status: Home / Self Care | Payer: Medicaid Other | Source: Ambulatory Visit | Attending: Obstetrics & Gynecology | Admitting: Obstetrics & Gynecology

## 2021-11-23 ENCOUNTER — Ambulatory Visit (INDEPENDENT_AMBULATORY_CARE_PROVIDER_SITE_OTHER): Payer: Medicaid Other | Admitting: Obstetrics & Gynecology

## 2021-11-23 ENCOUNTER — Encounter: Payer: Self-pay | Admitting: Obstetrics & Gynecology

## 2021-11-23 ENCOUNTER — Other Ambulatory Visit: Payer: Self-pay

## 2021-11-23 VITALS — BP 124/79 | HR 117 | Wt 183.0 lb

## 2021-11-23 DIAGNOSIS — Z124 Encounter for screening for malignant neoplasm of cervix: Secondary | ICD-10-CM | POA: Diagnosis present

## 2021-11-23 DIAGNOSIS — O23593 Infection of other part of genital tract in pregnancy, third trimester: Secondary | ICD-10-CM

## 2021-11-23 DIAGNOSIS — O0993 Supervision of high risk pregnancy, unspecified, third trimester: Secondary | ICD-10-CM | POA: Diagnosis not present

## 2021-11-23 DIAGNOSIS — O24415 Gestational diabetes mellitus in pregnancy, controlled by oral hypoglycemic drugs: Secondary | ICD-10-CM | POA: Diagnosis not present

## 2021-11-23 DIAGNOSIS — Z1389 Encounter for screening for other disorder: Secondary | ICD-10-CM

## 2021-11-23 DIAGNOSIS — Z3A34 34 weeks gestation of pregnancy: Secondary | ICD-10-CM

## 2021-11-23 DIAGNOSIS — Z331 Pregnant state, incidental: Secondary | ICD-10-CM | POA: Diagnosis not present

## 2021-11-23 LAB — POCT URINALYSIS DIPSTICK OB
Blood, UA: 3
Ketones, UA: NEGATIVE
Nitrite, UA: NEGATIVE

## 2021-11-23 LAB — POCT WET PREP (WET MOUNT): Trichomonas Wet Prep HPF POC: ABSENT

## 2021-11-23 MED ORDER — METRONIDAZOLE 0.75 % VA GEL: 1.0000 | Freq: Every day | VAGINAL | 0 refills | Status: AC

## 2021-11-23 NOTE — Progress Notes (Signed)
HIGH-RISK PREGNANCY VISIT Patient name: Bonnie Weaver MRN [id number]  Date of birth: 1986/03/03 Chief Complaint:   Routine Prenatal Visit, Non-stress Test (Vaginal discharge), and High Risk Gestation  History of Present Illness:   Bonnie Weaver is a 36 y.o. 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  - managed by outside facility  Today she notes odorous discharge- h/o BV and concerned that it has returned.  Denies vaginal itching.  Denies urinary symptoms.  Contractions: Not present.  .  Movement: Present. denies leaking of fluid.   [facility] 10/12/2021 06/27/2021 05/16/2021  Decreased Interest 0 0 0  Down, Depressed, Hopeless 0 0 0  PHQ - 2 Score 0 0 0  Altered sleeping 1 1 -  Tired, decreased energy 2 2 -  Change in appetite 0 0 -  Feeling bad or failure about yourself  0 0 -  Trouble concentrating 1 1 -  Moving slowly or fidgety/restless 0 0 -  Suicidal thoughts 0 0 -  PHQ-9 Score 4 4 -     Current Outpatient Medications  Medication Instructions   Accu-Chek Softclix Lancets lancets Check blood sugar four times daily.   Acetaminophen (TYLENOL PO) Oral   acyclovir (ZOVIRAX) 800 MG tablet Daily   albuterol (VENTOLIN HFA) 108 (90 Base) MCG/ACT inhaler As needed   ARIPiprazole (ABILIFY) 15 mg, Oral, Daily, Patient taking 1065m aspirin 162 mg, Oral, Daily, Swallow whole.   Blood Glucose Monitoring Suppl (ACCU-CHEK GUIDE ME) w/Device KIT 1 kit, Does not apply, 4 times daily   Blood Pressure Monitor MISC For regular home bp monitoring during pregnancy   buprenorphine (SUBUTEX) 8 MG SUBL SL tablet Sublingual   Continuous Blood Gluc Receiver (DEXCOM G6 RECEIVER) DEVI 1 kit, Does not apply, As  directed   Continuous Blood Gluc Transmit (DEXCOM G6 TRANSMITTER) MISC 1 Device, Does not apply, Continuous   Doxylamine-Pyridoxine (DICLEGIS) 10-10 MG TBEC Take 2 qhs; may also take one in am and one in afternoon prn nausea   ferrous sulfate 325 mg, Oral, Every other day   glucose blood (ACCU-CHEK GUIDE) test strip Check blood sugar four times a day.   lamoTRIgine (LAMICTAL) 100 mg, Oral, 2 times daily, Patient taking 150m89mmetFORMIN (GLUCOPHAGE) 500 mg, Oral, 2 times daily with meals   omeprazole (PRILOSEC) 20 MG capsule 1 capsule PRN   ondansetron (ZOFRAN-ODT) 4 mg, Oral, Every 6 hours PRN   Polyethylene Glycol 3350 (PEG 3350) 17 g PACK 1 packet   Prenatal Vit-Fe Fumarate-FA (PRENATAL VITAMIN PO) Oral   promethazine (PHENERGAN) 25 mg, Oral, Every 6 hours PRN   Trintellix 10 mg, Oral, Daily     Review of Systems:   Pertinent items are noted in HPI Denies headaches, visual changes, shortness of breath, chest pain, abdominal pain, severe nausea/vomiting, or problems with urination or bowel movements unless otherwise stated above. Pertinent History Reviewed:  Reviewed past medical,surgical, social, obstetrical and family history.  Reviewed problem list, medications and allergies. Physical Assessment:   Vitals:   11/23/21 1057  BP: 124/79  Pulse: (!) 117  Weight: 183 lb (83 kg)  Body mass index is 33.47 kg/m.           Physical Examination:   General appearance: alert, well  appearing, and in no distress  Mental status: normal mood, behavior, speech, dress, motor activity, and thought processes  Skin: warm & dry   Extremities: Edema: None    Cardiovascular: normal heart rate noted  Respiratory: normal respiratory effort, no distress  Abdomen: gravid, soft, non-tender  Pelvic: Cervical exam performed         Fetal Status:     Movement: Present    Fetal Surveillance Testing today: NST  NST being performed due to GDMA2   Fetal Monitoring:  Baseline: 150 bpm, Variability:  moderate, Accelerations: present, The accelerations are >15 bpm and more than 2 in 20 minutes, and Decelerations: variable x 1- disconnected    Final diagnosis:  Reactive NST   Chaperone: Marcelino Scot    Results for orders placed or performed in visit on 11/23/21 (from the past 24 hour(s))  POC Urinalysis Dipstick OB   Collection Time: 11/23/21 11:07 AM  Result Value Ref Range   Color, UA     Clarity, UA     Glucose, UA Moderate (2+) (A) Negative   Bilirubin, UA     Ketones, UA neg    Spec Grav, UA     Blood, UA 3    pH, UA     POC,PROTEIN,UA Trace Negative, Trace, Small (1+), Moderate (2+), Large (3+), 4+   Urobilinogen, UA     Nitrite, UA neg    Leukocytes, UA Trace (A) Negative   Appearance     Odor    POCT Wet Prep [facility])   Collection Time: 11/23/21 12:03 PM  Result Value Ref Range   Source Wet Prep POC     WBC, Wet Prep HPF POC     Bacteria Wet Prep HPF POC Few Few   BACTERIA WET PREP MORPHOLOGY POC     Clue Cells Wet Prep HPF POC Few (A) None   Clue Cells Wet Prep Whiff POC     Yeast Wet Prep HPF POC None None   KOH Wet Prep POC None None   Trichomonas Wet Prep HPF POC Absent Absent     Assessment & Plan:  High-risk pregnancy: G3P1011 at 30w4dwith an Estimated Date of Delivery: 12/31/21   1) GDMA2 -continue with current medication -antepartum testing twice weekly -growth every 4wk -Repeat C-section scheduled  2) Vaginitis -findings concerning for BV -Rx sent in  3) Cervical cancer screening -Pap collected since cultures were already being completed  Meds: No orders of the defined types were placed in this encounter.   Labs/procedures today: Pap, GC/C, GBS  Treatment Plan:  as outlined above  Reviewed: Preterm labor symptoms and general obstetric precautions including but not limited to vaginal bleeding, contractions, leaking of fluid and fetal movement were reviewed in detail with the patient.  All questions were answered. Pt has home bp cuff.  Check bp weekly, let [place]now if >140/90.   Follow-up: Return for HROB- twice weekly as scheduled.   Future Appointments  Date Time Provider DSchaller 11/27/2021 10:50 AM [facility] NURSE [facility]  11/30/2021  9:30 AM [facility] NURSE [facility]  11/30/2021 10:10 AM BRoma Schanz CNM [facility]  12/05/2021  3:45 PM [facility] None  12/05/2021  4:30 PM BRoma Schanz CNM [facility]  12/11/2021 10:50 AM [facility] NURSE [facility]  12/14/2021  9:50 AM EFlorian Buff MD [facility]  12/18/2021  2:30 PM [facility] NURSE [facility]  12/21/2021  9:50 AM OJanyth Pupa DO [facility]  12/25/2021 10:10 AM [facility] NURSE [facility]  12/28/2021  9:50 AM Gertie Exon, Royetta Crochet, CNM [facility]    Orders Placed This Encounter  Procedures   Strep Gp B NAA   POC Urinalysis Dipstick OB   POCT Wet Prep [facility])    Janyth Pupa, DO Attending [place] for [employer], [place]

## 2021-11-24 ENCOUNTER — Inpatient Hospital Stay (HOSPITAL_COMMUNITY)
Admission: AD | Admit: 2021-11-24 | Discharge: 2021-11-24 | Disposition: A | Discharge status: Home / Self Care | Payer: Medicaid Other | Attending: Obstetrics and Gynecology | Admitting: Obstetrics and Gynecology

## 2021-11-24 ENCOUNTER — Encounter (HOSPITAL_COMMUNITY): Payer: Self-pay | Admitting: Obstetrics and Gynecology

## 2021-11-24 DIAGNOSIS — O479 False labor, unspecified: Secondary | ICD-10-CM | POA: Diagnosis not present

## 2021-11-24 DIAGNOSIS — Z3A34 34 weeks gestation of pregnancy: Secondary | ICD-10-CM | POA: Insufficient documentation

## 2021-11-24 DIAGNOSIS — O4703 False labor before 37 completed weeks of gestation, third trimester: Secondary | ICD-10-CM | POA: Diagnosis not present

## 2021-11-24 DIAGNOSIS — O26893 Other specified pregnancy related conditions, third trimester: Secondary | ICD-10-CM | POA: Diagnosis present

## 2021-11-24 DIAGNOSIS — O09523 Supervision of elderly multigravida, third trimester: Secondary | ICD-10-CM | POA: Insufficient documentation

## 2021-11-24 LAB — URINALYSIS, ROUTINE W REFLEX MICROSCOPIC
Bilirubin Urine: NEGATIVE
Glucose, UA: NEGATIVE mg/dL
Ketones, ur: NEGATIVE mg/dL
Leukocytes,Ua: NEGATIVE
Nitrite: NEGATIVE
Protein, ur: 30 mg/dL — AB
RBC / HPF: >50 RBC/hpf — ABNORMAL HIGH (ref 0–5)
Specific Gravity, Urine: 1.013 (ref 1.005–1.030)
pH: 7 (ref 5.0–8.0)

## 2021-11-24 LAB — CYTOLOGY - PAP
Chlamydia: NEGATIVE
Comment: NEGATIVE
Comment: NEGATIVE
Comment: NORMAL
Diagnosis: NEGATIVE
High risk HPV: NEGATIVE
Neisseria Gonorrhea: NEGATIVE

## 2021-11-24 NOTE — MAU Note (Signed)
Scant bleeding noted now when she wiped after urination

## 2021-11-24 NOTE — MAU Note (Addendum)
Contractions started at 0500, feeling them in lower abd and back.  Coming every 5-6 min. No bleeding or leaking.  Denies fever, diarrhea or constipation.  No hx of PTL.  Denies pain with urination, but it 'feels like something is stuck".

## 2021-11-24 NOTE — MAU Provider Note (Signed)
°History  °  ° °CSN: [id number] ° °Arrival date and time: 11/24/21 0743 ° ° Event Date/Time  ° First Provider Initiated Contact with Patient 11/24/21 0827   °  ° °Chief Complaint  °Patient presents with  ° Contractions  ° °HPI °Bonnie Weaver is a 35 y.o. G3P1011 at [redacted]w[redacted]d who presents for contractions. Symptoms started yesterday. Reports contractions about every 7 minutes. Some are painful & some tightening. Also feels pelvic pressure that is worse when she voids. Rates pain 7/10. Hasn't treated symptoms. Denies dysuria, back pain, fever, vaginal bleeding, or LOF. Reports good fetal movement.  ° °OB History   ° ° Gravida  °3  ° Para  °1  ° Term  °1  ° Preterm  °   ° AB  °1  ° Living  °1  °  ° ° SAB  °   ° IAB  °   ° Ectopic  °   ° Multiple  °   ° Live Births  °1  °   °  °  ° ° °Past Medical History:  °Diagnosis Date  ° Anxiety   ° Asthma   ° Bipolar 1 disorder (HCC)   ° History of kidney stones   ° ° °Past Surgical History:  °Procedure Laterality Date  ° BREAST BIOPSY Right   ° CESAREAN SECTION    ° CHOLECYSTECTOMY N/A 08/18/2021  ° Procedure: LAPAROSCOPIC CHOLECYSTECTOMY;  Surgeon: Jenkins, Mark, MD;  Location: [facility];  Service: General;  Laterality: N/A;  ° LITHOTRIPSY    ° MULTIPLE TOOTH EXTRACTIONS    ° ° °Family History  °Problem Relation Age of Onset  ° Other Mother   °     sepsis  ° Hypertension Father   ° Autoimmune disease Father   ° Sleep apnea Father   ° Asthma Sister   ° Diabetes Sister   ° Anxiety disorder Daughter   ° Healthy Daughter   ° Diabetes Maternal Grandmother   ° Lung cancer Maternal Grandfather   ° Rheum arthritis Paternal Grandmother   ° ° °Social History  ° °Tobacco Use  ° Smoking status: Former  °  Packs/day: 0.25  °  Years: 15.00  °  Pack years: 3.75  °  Types: Cigarettes  ° Smokeless tobacco: Never  °Vaping Use  ° Vaping Use: Never used  °Substance Use Topics  ° Alcohol use: No  ° Drug use: Not Currently  ° ° °Allergies:  °Allergies  °Allergen Reactions  ° Codeine Rash  ° Sulfa  Antibiotics   ° ° °Medications Prior to Admission  °Medication Sig Dispense Refill Last Dose  ° Acetaminophen (TYLENOL PO) Take by mouth.   11/24/2021 at 0500  ° acyclovir (ZOVIRAX) 800 MG tablet daily.   Past Week  ° ARIPiprazole (ABILIFY) 15 MG tablet Take 15 mg by mouth daily. Patient taking 10mg   11/23/2021  ° aspirin 81 MG EC tablet Take 2 tablets (162 mg total) by mouth daily. Swallow whole. 180 tablet 2 11/23/2021  ° buprenorphine (SUBUTEX) 8 MG SUBL SL tablet Place under the tongue.   11/24/2021 at 0500  ° Doxylamine-Pyridoxine (DICLEGIS) 10-10 MG TBEC Take 2 qhs; may also take one in am and one in afternoon prn nausea 120 tablet 6 11/24/2021  ° ferrous sulfate 325 (65 FE) MG tablet Take 1 tablet (325 mg total) by mouth every other day. 45 tablet 2 11/23/2021  ° lamoTRIgine (LAMICTAL) 100 MG tablet Take 100 mg by mouth 2 (two) times daily. Patient taking   150mg   11/23/2021  ° metFORMIN (GLUCOPHAGE) 500 MG tablet Take 1 tablet (500 mg total) by mouth 2 (two) times daily with a meal. 60 tablet 3 11/23/2021  ° omeprazole (PRILOSEC) 20 MG capsule 1 capsule PRN   11/24/2021  ° promethazine (PHENERGAN) 25 MG tablet Take 1 tablet (25 mg total) by mouth every 6 (six) hours as needed for nausea or vomiting. 30 tablet 1 11/23/2021 at 2130  ° Accu-Chek Softclix Lancets lancets Check blood sugar four times daily. 100 each 12   ° albuterol (VENTOLIN HFA) 108 (90 Base) MCG/ACT inhaler as needed.     ° Blood Glucose Monitoring Suppl (ACCU-CHEK GUIDE ME) w/Device KIT 1 kit by Does not apply route in the morning, at noon, in the evening, and at bedtime. 1 kit 0   ° Blood Pressure Monitor MISC For regular home bp monitoring during pregnancy 1 each 0   ° Continuous Blood Gluc Receiver (DEXCOM G6 RECEIVER) DEVI 1 kit by Does not apply route as directed. (Patient not taking: Reported on 11/20/2021) 1 each 0   ° Continuous Blood Gluc Transmit (DEXCOM G6 TRANSMITTER) MISC 1 Device by Does not apply route continuous. 1 each 3   ° glucose blood  (ACCU-CHEK GUIDE) test strip Check blood sugar four times a day. 50 each 12   ° metroNIDAZOLE (METROGEL VAGINAL) 0.75 % vaginal gel Place 1 Applicatorful vaginally at bedtime for 5 days. 50 g 0   ° ondansetron (ZOFRAN-ODT) 4 MG disintegrating tablet Take 1 tablet (4 mg total) by mouth every 6 (six) hours as needed for nausea. (Patient not taking: Reported on 11/20/2021) 30 tablet 2   ° Polyethylene Glycol 3350 (PEG 3350) 17 g PACK Take 1 packet by mouth. (Patient not taking: Reported on 11/23/2021)     ° Prenatal Vit-Fe Fumarate-FA (PRENATAL VITAMIN PO) Take by mouth.     ° TRINTELLIX 10 MG TABS tablet Take 10 mg by mouth daily.     ° ° °Review of Systems  °Constitutional: Negative.   °Gastrointestinal:  Positive for abdominal pain. Negative for constipation, diarrhea, nausea and vomiting.  °Genitourinary:  Positive for pelvic pain. Negative for dysuria, flank pain, vaginal bleeding and vaginal discharge.  °Musculoskeletal:  Negative for back pain.  °Physical Exam  ° °Blood pressure 130/67, pulse 89, temperature 98.9 °F (37.2 °C), temperature source Oral, height 5' 2" (1.575 m), weight 82.3 kg, last menstrual period 03/26/2021, SpO2 98 %. ° °Physical Exam °Vitals and nursing note reviewed. Exam conducted with a chaperone present.  °Constitutional:   °   General: She is not in acute distress. °   Appearance: Normal appearance.  °HENT:  °   Head: Normocephalic and atraumatic.  °Eyes:  °   General: No scleral icterus. °   Conjunctiva/sclera: Conjunctivae normal.  °Pulmonary:  °   Effort: Pulmonary effort is normal. No respiratory distress.  °Abdominal:  °   Palpations: Abdomen is soft.  °   Tenderness: There is no abdominal tenderness. There is no right CVA tenderness or left CVA tenderness.  °Genitourinary: °   Comments: Dilation: Closed °Effacement (%): 50 °Cervical Position: Posterior °Station: -3 °Exam by:: Erin Lawrence, NP ° °Skin: °   General: Skin is warm and dry.  °Neurological:  °   General: No focal deficit  present.  °   Mental Status: She is alert.  °Psychiatric:     °   Mood and Affect: Mood normal.     °   Behavior: Behavior normal.  ° °  NST:  Baseline: 135 bpm, Variability: Good {> 6 bpm), Accelerations: Reactive, and Decelerations: Absent ° °MAU Course  °Procedures °Results for orders placed or performed during the hospital encounter of 11/24/21 (from the past 24 hour(s))  °Urinalysis, Routine w reflex microscopic Urine, Clean Catch     Status: Abnormal  ° Collection Time: 11/24/21  8:27 AM  °Result Value Ref Range  ° Color, Urine YELLOW YELLOW  ° APPearance HAZY (A) CLEAR  ° Specific Gravity, Urine 1.013 1.005 - 1.030  ° pH 7.0 5.0 - 8.0  ° Glucose, UA NEGATIVE NEGATIVE mg/dL  ° Hgb urine dipstick LARGE (A) NEGATIVE  ° Bilirubin Urine NEGATIVE NEGATIVE  ° Ketones, ur NEGATIVE NEGATIVE mg/dL  ° Protein, ur 30 (A) NEGATIVE mg/dL  ° Nitrite NEGATIVE NEGATIVE  ° Leukocytes,Ua NEGATIVE NEGATIVE  ° RBC / HPF >50 (H) 0 - 5 RBC/hpf  ° WBC, UA 6-10 0 - 5 WBC/hpf  ° Bacteria, UA FEW (A) NONE SEEN  ° Squamous Epithelial / LPF 6-10 0 - 5  ° Mucus PRESENT   ° Hyaline Casts, UA PRESENT   ° ° °MDM °Cervix closed and remained closed after 1.5 hrs of monitoring. No regular contractions on the monitor. Patient does not appear to be uncomfortable.  ° °Reports pelvic discomfort with voiding that is a new symptom but no other urinary complaints. Will send urine for culture. No CVA tenderness and afebrile.  ° ° °Assessment and Plan  ° °1. Braxton Hick's contraction   °2. [redacted] weeks gestation of pregnancy   ° °-discussed labor precautions & reasons to return to MAU °-ob urine culture pending ° °Erin Lawrence °11/24/2021, 8:27 AM  °

## 2021-11-25 LAB — STREP GP B NAA: Strep Gp B NAA: POSITIVE — AB

## 2021-11-25 LAB — CULTURE, OB URINE
Culture: NO GROWTH
Special Requests: NORMAL

## 2021-11-27 ENCOUNTER — Ambulatory Visit (INDEPENDENT_AMBULATORY_CARE_PROVIDER_SITE_OTHER): Payer: Medicaid Other | Admitting: *Deleted

## 2021-11-27 ENCOUNTER — Other Ambulatory Visit: Payer: Self-pay

## 2021-11-27 DIAGNOSIS — O24415 Gestational diabetes mellitus in pregnancy, controlled by oral hypoglycemic drugs: Secondary | ICD-10-CM | POA: Diagnosis not present

## 2021-11-27 DIAGNOSIS — O0993 Supervision of high risk pregnancy, unspecified, third trimester: Secondary | ICD-10-CM

## 2021-11-27 LAB — POCT URINALYSIS DIPSTICK OB
Glucose, UA: NEGATIVE
Ketones, UA: NEGATIVE
Leukocytes, UA: NEGATIVE
Nitrite, UA: NEGATIVE
POC,PROTEIN,UA: NEGATIVE

## 2021-11-27 NOTE — Progress Notes (Cosign Needed)
° °  NURSE VISIT- NST  SUBJECTIVE:  Bonnie Weaver is a 36 y.o. G59P1011 female at [redacted]w[redacted]d, here for a NST for pregnancy complicated by A2DM currently on Metformin .  She reports active fetal movement, contractions: none, vaginal bleeding: none, membranes: intact.   OBJECTIVE:  BP 107/70    Pulse 92    Wt 184 lb 9.6 oz (83.7 kg)    LMP 03/26/2021 (Approximate)    BMI 33.76 kg/m   Appears well, no apparent distress  Results for orders placed or performed in visit on 11/16/21 (from the past 24 hour(s))  POC Urinalysis Dipstick OB   Collection Time: 11/27/21 11:10 AM  Result Value Ref Range   Color, UA     Clarity, UA     Glucose, UA Negative Negative   Bilirubin, UA     Ketones, UA neg    Spec Grav, UA     Blood, UA small    pH, UA     POC,PROTEIN,UA Negative Negative, Trace, Small (1+), Moderate (2+), Large (3+), 4+   Urobilinogen, UA     Nitrite, UA neg    Leukocytes, UA Negative Negative   Appearance     Odor      NST: FHR baseline 150 bpm, Variability: moderate, Accelerations:present, Decelerations:  Absent= Cat 1/reactive Toco: none   ASSESSMENT: G3P1011 at [redacted]w[redacted]d with A2DM currently on Metformin NST reactive  PLAN: EFM strip reviewed by Dr. Despina Hidden   Recommendations: keep next appointment as scheduled    Jobe Marker  11/27/2021 12:30 PM

## 2021-11-30 ENCOUNTER — Other Ambulatory Visit (HOSPITAL_COMMUNITY)
Admission: RE | Admit: 2021-11-30 | Discharge: 2021-11-30 | Disposition: A | Discharge status: Home / Self Care | Payer: Medicaid Other | Source: Ambulatory Visit | Attending: Women's Health | Admitting: Women's Health

## 2021-11-30 ENCOUNTER — Ambulatory Visit (INDEPENDENT_AMBULATORY_CARE_PROVIDER_SITE_OTHER): Payer: Medicaid Other | Admitting: Women's Health

## 2021-11-30 ENCOUNTER — Other Ambulatory Visit: Payer: Self-pay

## 2021-11-30 ENCOUNTER — Other Ambulatory Visit: Payer: Medicaid Other

## 2021-11-30 ENCOUNTER — Encounter: Payer: Self-pay | Admitting: Women's Health

## 2021-11-30 VITALS — BP 124/76 | HR 99 | Wt 183.0 lb

## 2021-11-30 DIAGNOSIS — O0993 Supervision of high risk pregnancy, unspecified, third trimester: Secondary | ICD-10-CM | POA: Insufficient documentation

## 2021-11-30 DIAGNOSIS — Z1389 Encounter for screening for other disorder: Secondary | ICD-10-CM

## 2021-11-30 DIAGNOSIS — O26893 Other specified pregnancy related conditions, third trimester: Secondary | ICD-10-CM | POA: Diagnosis present

## 2021-11-30 DIAGNOSIS — O24419 Gestational diabetes mellitus in pregnancy, unspecified control: Secondary | ICD-10-CM | POA: Diagnosis not present

## 2021-11-30 DIAGNOSIS — L299 Pruritus, unspecified: Secondary | ICD-10-CM

## 2021-11-30 DIAGNOSIS — N898 Other specified noninflammatory disorders of vagina: Secondary | ICD-10-CM | POA: Diagnosis present

## 2021-11-30 DIAGNOSIS — Z331 Pregnant state, incidental: Secondary | ICD-10-CM

## 2021-11-30 LAB — POCT URINALYSIS DIPSTICK OB
Blood, UA: 3
Glucose, UA: NEGATIVE
Leukocytes, UA: NEGATIVE
Nitrite, UA: NEGATIVE

## 2021-11-30 LAB — OB RESULTS CONSOLE GC/CHLAMYDIA: Gonorrhea: NEGATIVE

## 2021-11-30 NOTE — Patient Instructions (Addendum)
Bonnie Weaver, thank you for choosing our office today! We appreciate the opportunity to meet your healthcare needs. You may receive a short survey by mail, e-mail, or through [employer]. If you are happy with your care we would appreciate if you could take just a few minutes to complete the survey questions. We read all of your comments and take your feedback very seriously. Thank you again for choosing our office.  Center for [employer] Healthcare Team at [facility]  [facility] at [facility] ([address] [place], [place] [address]) Entrance C, located off of [address] 24/7 valet parking   For itching:  Avoid hot showers/baths, take cool/luke-warm showers/baths instead Cool washcloths to itchy areas Aquaphor or Eucerin creams to moisturize the skin Hydrocortisone or Benadryl cream, or Benadryl by mouth for the itching Oatmeal baths   CLASSES: Go to [email or web address] to register for classes (childbirth, breastfeeding, waterbirth, infant CPR, daddy bootcamp, etc.)  Call the office [phone]) or go to [facility] if: You begin to have strong, frequent contractions Your water breaks.  Sometimes it is a big gush of fluid, sometimes it is just a trickle that keeps getting your panties wet or running down your legs You have vaginal bleeding.  It is normal to have a small amount of spotting if your cervix was checked.  You don't feel your baby moving like normal.  If you don't, get you something to eat and drink and lay down and focus on feeling your baby move.   If your baby is still not moving like normal, you should call the office or go to [facility].  Call the office [phone]) or go to [facility] hospital for these signs of pre-eclampsia: Severe headache that does not go away with Tylenol Visual changes- seeing spots, double, blurred vision Pain under your right breast or upper abdomen that does not go away with Tums or heartburn medicine Nausea and/or  vomiting Severe swelling in your hands, feet, and face   Tdap Vaccine It is recommended that you get the Tdap vaccine during the third trimester of EACH pregnancy to help protect your baby from getting pertussis (whooping cough) 27-36 weeks is the BEST time to do this so that you can pass the protection on to your baby. During pregnancy is better than after pregnancy, but if you are unable to get it during pregnancy it will be offered at the hospital.  You can get this vaccine with [place], at the health department, your family doctor, or some local pharmacies Everyone who will be around your baby should also be up-to-date on their vaccines before the baby comes. Adults (who are not pregnant) only need 1 dose of Tdap during adulthood.   [facility] Pediatricians/Family Doctors [facility]): [address] Colette Ribas, [phone]           [facility]: [address], [phone]                [facility]): [address], [phone] (call to ask if accepting patients) [facility] Department: [address], [place], [phone]    [facility] Pediatricians/Family Doctors [facility]): [phone] [address], [phone] [facility]: [address] [place], [phone] [facility]: [address], [phone]  [facility] Doctors  [facility]): [phone] [facility]: [address] [phone]   [facility] Doctors [facility]: [address] [phone]  [facility] Doctors  [facility]: [phone], [phone]  Home Blood Pressure Monitoring for Patients   Your provider has recommended that you check your blood pressure (BP) at least once a week at home. If you do not have a blood pressure cuff at home, one will be provided for you. Contact your  provider if you have not received your monitor within 1 week.   Helpful Tips for Accurate Home Blood Pressure Checks  Don't smoke, exercise, or drink caffeine 30 minutes before checking your BP Use the restroom before checking your BP (a full bladder can raise your pressure) Relax in a comfortable upright chair Feet on the ground Left arm resting comfortably on a flat surface at the level of your heart Legs uncrossed Back supported Sit quietly and don't talk Place the cuff on your bare arm Adjust snuggly, so that only two fingertips can fit between your skin and the top of the cuff Check 2 readings separated by at least one minute Keep a log of your BP readings For a visual, please reference this diagram: http://ccnc.care/bpdiagram  Provider Name: [facility]     Phone: [phone]  Zone 1: ALL CLEAR  Continue to monitor your symptoms:  BP reading is less than 140 (top number) or less than 90 (bottom number)  No right upper stomach pain No headaches or seeing spots No feeling nauseated or throwing up No swelling in face and hands  Zone 2: CAUTION Call your doctor's office for any of the following:  BP reading is greater than 140 (top number) or greater than 90 (bottom number)  Stomach pain under your ribs in the middle or right side Headaches or seeing spots Feeling nauseated or throwing up Swelling in face and hands  Zone 3: EMERGENCY  Seek immediate medical care if you have any of the following:  BP reading is greater than160 (top number) or greater than 110 (bottom number) Severe headaches not improving with Tylenol Serious difficulty catching your breath Any worsening symptoms from Zone 2  Preterm Labor and Birth Information  The normal length of a pregnancy is 39-41 weeks. Preterm labor is when labor starts before 37 completed weeks of pregnancy. What are the risk factors for preterm labor? Preterm labor is more likely to occur in women who: Have certain  infections during pregnancy such as a bladder infection, sexually transmitted infection, or infection inside the uterus (chorioamnionitis). Have a shorter-than-normal cervix. Have gone into preterm labor before. Have had surgery on their cervix. Are younger than age 48 or older than age 65. Are African American. Are pregnant with twins or multiple babies (multiple gestation). Take street drugs or smoke while pregnant. Do not gain enough weight while pregnant. Became pregnant shortly after having been pregnant. What are the symptoms of preterm labor? Symptoms of preterm labor include: Cramps similar to those that can happen during a menstrual period. The cramps may happen with diarrhea. Pain in the abdomen or lower back. Regular uterine contractions that may feel like tightening of the abdomen. A feeling of increased pressure in the pelvis. Increased watery or bloody mucus discharge from the vagina. Water breaking (ruptured amniotic sac). Why is it important to recognize signs of preterm labor? It is important to recognize signs of preterm labor because babies who are born prematurely may not be fully developed. This can put them at an increased risk for: Long-term (chronic) heart and lung problems. Difficulty  immediately after birth with regulating body systems, including blood sugar, body temperature, heart rate, and breathing rate. Bleeding in the brain. Cerebral palsy. Learning difficulties. Death. These risks are highest for babies who are born before 34 weeks of pregnancy. How is preterm labor treated? Treatment depends on the length of your pregnancy, your condition, and the health of your baby. It may involve: Having a stitch (suture) placed in your cervix to prevent your cervix from opening too early (cerclage). Taking or being given medicines, such as: Hormone medicines. These may be given early in pregnancy to help support the pregnancy. Medicine to stop  contractions. Medicines to help mature the babys lungs. These may be prescribed if the risk of delivery is high. Medicines to prevent your baby from developing cerebral palsy. If the labor happens before 34 weeks of pregnancy, you may need to stay in the hospital. What should I do if I think I am in preterm labor? If you think that you are going into preterm labor, call your health care provider right away. How can I prevent preterm labor in future pregnancies? To increase your chance of having a full-term pregnancy: Do not use any tobacco products, such as cigarettes, chewing tobacco, and e-cigarettes. If you need help quitting, ask your health care provider. Do not use street drugs or medicines that have not been prescribed to you during your pregnancy. Talk with your health care provider before taking any herbal supplements, even if you have been taking them regularly. Make sure you gain a healthy amount of weight during your pregnancy. Watch for infection. If you think that you might have an infection, get it checked right away. Make sure to tell your health care provider if you have gone into preterm labor before. This information is not intended to replace advice given to you by your health care provider. Make sure you discuss any questions you have with your health care provider. Document Revised: 01/23/2019 Document Reviewed: 02/22/2016 Elsevier Patient Education  2020 [employer].

## 2021-11-30 NOTE — Progress Notes (Signed)
HIGH-RISK PREGNANCY VISIT Patient name: Bonnie Weaver MRN [id number]  Date of birth: 07/16/1986 Chief Complaint:   Routine Prenatal Visit (NST/ vaginal discharge, odor)  History of Present Illness:   Bonnie Weaver is a 36 y.o. G31P1011 female at [redacted]w[redacted]d with an Estimated Date of Delivery: 12/31/21 being seen today for ongoing management of a high-risk pregnancy complicated by diabetes mellitus A2DM currently on metformin 500mg  BID, suboxone therapy .    Today she reports  fbs 90-97 (1 >95), 2hr pp 97-123 (only 4 >120) . Itching hands/feet, worse at night. Not fasting today. Vaginal d/c w/ odor.   Contractions: Not present.  .  Movement: Present. denies leaking of fluid.   Depression screen [facility] 2/9 10/12/2021 06/27/2021 05/16/2021  Decreased Interest 0 0 0  Down, Depressed, Hopeless 0 0 0  PHQ - 2 Score 0 0 0  Altered sleeping 1 1 -  Tired, decreased energy 2 2 -  Change in appetite 0 0 -  Feeling bad or failure about yourself  0 0 -  Trouble concentrating 1 1 -  Moving slowly or fidgety/restless 0 0 -  Suicidal thoughts 0 0 -  PHQ-9 Score 4 4 -     GAD 7 : Generalized Anxiety Score 10/12/2021 06/27/2021  Nervous, Anxious, on Edge 2 1  Control/stop worrying 0 1  Worry too much - different things 1 0  Trouble relaxing 0 0  Restless 0 0  Easily annoyed or irritable 1 2  Afraid - awful might happen 0 1  Total GAD 7 Score 4 5     Review of Systems:   Pertinent items are noted in HPI Denies abnormal vaginal discharge w/ itching/odor/irritation, headaches, visual changes, shortness of breath, chest pain, abdominal pain, severe nausea/vomiting, or problems with urination or bowel movements unless otherwise stated above. Pertinent History Reviewed:  Reviewed past medical,surgical, social, obstetrical and family history.  Reviewed problem list, medications and allergies. Physical Assessment:   Vitals:   11/30/21 1110  BP: 124/76  Pulse: 99  Weight: 183 lb (83 kg)  Body mass  index is 33.47 kg/m.           Physical Examination:   General appearance: alert, well appearing, and in no distress  Mental status: alert, oriented to person, place, and time  Skin: warm & dry   Extremities: Edema: Trace    Cardiovascular: normal heart rate noted  Respiratory: normal respiratory effort, no distress  Abdomen: gravid, soft, non-tender  Pelvic:  blind CV swab          Fetal Status: Fetal Heart Rate (bpm): 155   Movement: Present    Fetal Surveillance Testing today: NST: FHR baseline 155 bpm, Variability: moderate, Accelerations:present, Decelerations:  Absent= Cat 1/reactive Toco: none    Chaperone: Peggy Dones    Results for orders placed or performed in visit on 11/30/21 (from the past 24 hour(s))  POC Urinalysis Dipstick OB   Collection Time: 11/30/21 11:31 AM  Result Value Ref Range   Color, UA     Clarity, UA     Glucose, UA Negative Negative   Bilirubin, UA     Ketones, UA small    Spec Grav, UA     Blood, UA 3    pH, UA     POC,PROTEIN,UA Trace Negative, Trace, Small (1+), Moderate (2+), Large (3+), 4+   Urobilinogen, UA     Nitrite, UA neg    Leukocytes, UA Negative Negative   Appearance  Odor      Assessment & Plan:  High-risk pregnancy: G3P1011 at [redacted]w[redacted]d with an Estimated Date of Delivery: 12/31/21   1) A2DM, stable on metformin 500mg  BID  2) Suboxone therapy, stable  3) Itching hands/feet, worse at night> fasting bile acids and CMP in AM  4) Vaginal d/c w/ odor>CV swab  Meds: No orders of the defined types were placed in this encounter.   Labs/procedures today: CV swab and NST  Treatment Plan:  Growth u/s q4wks    2x/wk testing     Deliver @ 39-39.6wks:______   Reviewed: Preterm labor symptoms and general obstetric precautions including but not limited to vaginal bleeding, contractions, leaking of fluid and fetal movement were reviewed in detail with the patient.  All questions were answered. Does have home bp cuff. Office bp cuff  given: not applicable. Check bp weekly, let us know if consistently >140 and/or >90.  Follow-up: Return for As scheduled.   Future Appointments  Date Time Provider [place]  12/05/2021  3:45 PM [facility] - [facility]TO[facility]YN [place] [facility] None  12/05/2021  4:30 PM Roma Schanz, CNM [facility] FTOBGYN  12/11/2021 10:50 AM [facility]FTOBGYN NURSE [facility] FTOBGYN  12/14/2021  9:50 AM Florian Buff, MD [facility] FTOBGYN  12/18/2021  2:30 PM [facility]FTOBGYN NURSE [facility] FTOBGYN  12/21/2021  9:50 AM Janyth Pupa, DO [facility] FTOBGYN  12/25/2021 10:10 AM [facility]-FTOBGYN NURSE [facility] FTOBGYN  12/28/2021  9:50 AM Roma Schanz, CNM [facility] FTOBGYN    Orders Placed This Encounter  Procedures   Comprehensive metabolic panel   Bile acids, total   POC Urinalysis Dipstick OB   Roma Schanz  Attending Physician for the Center for [place] [facility] 11/30/2021 12:06 PM

## 2021-12-01 LAB — CERVICOVAGINAL ANCILLARY ONLY
Bacterial Vaginitis (gardnerella): NEGATIVE
Candida Glabrata: NEGATIVE
Candida Vaginitis: NEGATIVE
Chlamydia: NEGATIVE
Comment: NEGATIVE
Comment: NEGATIVE
Comment: NEGATIVE
Comment: NEGATIVE
Comment: NEGATIVE
Comment: NORMAL
Neisseria Gonorrhea: NEGATIVE
Trichomonas: NEGATIVE

## 2021-12-04 ENCOUNTER — Other Ambulatory Visit: Payer: Self-pay | Admitting: Women's Health

## 2021-12-04 ENCOUNTER — Telehealth: Payer: Self-pay | Admitting: Women's Health

## 2021-12-04 DIAGNOSIS — O24419 Gestational diabetes mellitus in pregnancy, unspecified control: Secondary | ICD-10-CM

## 2021-12-04 NOTE — Telephone Encounter (Signed)
Returned pt's call, two identifiers used. Pt states that she normally takes her BP around 2 pm each day. She has been feeling a little more tired than normal. She denies any headaches, but has been having some mild nausea, blurred vision intermittently, and mild pain in her upper right chest. She states that she had her gallbladder removed in November of 2022. Pt checked her BP again while on the phone and got a reading of 122/86. Pt has appt with Joellyn Haff for 2/21. Spoke with Selena Batten regarding pt. Instructed pt to seek emergent care at [facility] if she experiences any severe headaches, upper right chest pain, or abnormal vision changes. Pt confirmed understanding and will continue to monitor her BP accordingly.

## 2021-12-04 NOTE — Telephone Encounter (Signed)
[facility] CALLING with evl blood pressure 132/91 ten mins ago swelling and shortness of breath and [place].

## 2021-12-05 ENCOUNTER — Telehealth: Payer: Self-pay

## 2021-12-05 ENCOUNTER — Ambulatory Visit (INDEPENDENT_AMBULATORY_CARE_PROVIDER_SITE_OTHER): Payer: Medicaid Other | Admitting: Obstetrics & Gynecology

## 2021-12-05 ENCOUNTER — Ambulatory Visit (INDEPENDENT_AMBULATORY_CARE_PROVIDER_SITE_OTHER): Payer: Medicaid Other

## 2021-12-05 ENCOUNTER — Other Ambulatory Visit: Payer: Self-pay

## 2021-12-05 VITALS — BP 132/79 | HR 101 | Wt 185.0 lb

## 2021-12-05 DIAGNOSIS — Z3A36 36 weeks gestation of pregnancy: Secondary | ICD-10-CM

## 2021-12-05 DIAGNOSIS — O24419 Gestational diabetes mellitus in pregnancy, unspecified control: Secondary | ICD-10-CM

## 2021-12-05 DIAGNOSIS — O0993 Supervision of high risk pregnancy, unspecified, third trimester: Secondary | ICD-10-CM

## 2021-12-05 DIAGNOSIS — O09893 Supervision of other high risk pregnancies, third trimester: Secondary | ICD-10-CM

## 2021-12-05 LAB — POCT URINALYSIS DIPSTICK OB
Blood, UA: NEGATIVE
Glucose, UA: NEGATIVE
Ketones, UA: NEGATIVE
Leukocytes, UA: NEGATIVE
Nitrite, UA: NEGATIVE

## 2021-12-05 NOTE — Telephone Encounter (Signed)
Vicky with [employer] called reporting pt's BP at approx 1355 reading 124/91 with nausea and swelling. No other symptoms reported.  Called pt, two identifiers used.  Pt confirmed the nausea and stated that it had gotten worse over the past week. She takes Phenergan, but states that it doesn't last as long as it used to and has to take 2 or 3 doses daily instead of just one. She stated that her hands and feet have some slight swelling. She has felt a little light-headed, but denies any headaches. She denies any vision changes. She still has the intermittent mild right sided pain where her gallbladder was removed in November. Pt will keep her appt for [place] and Dr Charlotta Newton.

## 2021-12-05 NOTE — Progress Notes (Signed)
[place] 36+2 wks,cephalic,fhr 171 bpm,posterior placenta gr 2,AFI 20 cm,BPP 8/8,EFW 3189 g 80%,AC 99%

## 2021-12-05 NOTE — Progress Notes (Signed)
HIGH-RISK PREGNANCY VISIT Patient name: Bonnie Weaver MRN [id number]  Date of birth: Dec 20, 1985 Chief Complaint:   Routine Prenatal Visit ([place] today)  History of Present Illness:   Bonnie Weaver is a 36 y.o. G110P1011 female at 82w2dwith an Estimated Date of Delivery: 12/31/21 being seen today for ongoing management of a high-risk pregnancy complicated by -GDMA2- on metformin 5092mbid -Suboxone therapy -Prior C-section repeat scheduled -h/o preeclampsia- denies HA, blurry vision or RUQ pain Pt concerned about recurrence, BPs 130/80s  Today she reports no complaints.   Contractions: Not present. Vag. Bleeding: None.  Movement: Present. denies leaking of fluid.   [facility] 10/12/2021 06/27/2021 05/16/2021  Decreased Interest 0 0 0  Down, Depressed, Hopeless 0 0 0  PHQ - 2 Score 0 0 0  Altered sleeping 1 1 -  Tired, decreased energy 2 2 -  Change in appetite 0 0 -  Feeling bad or failure about yourself  0 0 -  Trouble concentrating 1 1 -  Moving slowly or fidgety/restless 0 0 -  Suicidal thoughts 0 0 -  PHQ-9 Score 4 4 -     Current Outpatient Medications  Medication Instructions   Accu-Chek Softclix Lancets lancets Check blood sugar four times daily.   Acetaminophen (TYLENOL PO) Oral   acyclovir (ZOVIRAX) 800 MG tablet Daily   albuterol (VENTOLIN HFA) 108 (90 Base) MCG/ACT inhaler As needed   ARIPiprazole (ABILIFY) 15 mg, Oral, Daily, Patient taking 1044m aspirin 162 mg, Oral, Daily, Swallow whole.   Blood Glucose Monitoring Suppl (ACCU-CHEK GUIDE ME) w/Device KIT 1 kit, Does not apply, 4 times daily   Blood Pressure Monitor MISC For regular home bp monitoring during pregnancy   buprenorphine (SUBUTEX) 8 MG SUBL SL tablet Sublingual   Continuous Blood Gluc Receiver (DEXCOM G6 RECEIVER) DEVI 1 kit, Does not apply, As directed   Continuous Blood Gluc Transmit (DEXCOM G6 TRANSMITTER) MISC 1 Device, Does not apply, Continuous   Doxylamine-Pyridoxine  (DICLEGIS) 10-10 MG TBEC Take 2 qhs; may also take one in am and one in afternoon prn nausea   ferrous sulfate 325 mg, Oral, Every other day   glucose blood (ACCU-CHEK GUIDE) test strip Check blood sugar four times a day.   lamoTRIgine (LAMICTAL) 100 mg, 2 times daily   metFORMIN (GLUCOPHAGE) 500 mg, Oral, 2 times daily with meals   omeprazole (PRILOSEC) 20 MG capsule 1 capsule PRN   Prenatal Vit-Fe Fumarate-FA (PRENATAL VITAMIN PO) Oral   promethazine (PHENERGAN) 25 mg, Oral, Every 6 hours PRN     Review of Systems:   Pertinent items are noted in HPI Denies abnormal vaginal discharge w/ itching/odor/irritation, headaches, visual changes, shortness of breath, chest pain, abdominal pain, severe nausea/vomiting, or problems with urination or bowel movements unless otherwise stated above. Pertinent History Reviewed:  Reviewed past medical,surgical, social, obstetrical and family history.  Reviewed problem list, medications and allergies. Physical Assessment:   Vitals:   12/05/21 1541  BP: 132/79  Pulse: (!) 101  Weight: 185 lb (83.9 kg)  Body mass index is 33.84 kg/m.           Physical Examination:   General appearance: alert, well appearing, and in no distress  Mental status: normal mood, behavior, speech, dress, motor activity, and thought processes  Skin: warm & dry   Extremities: Edema: Trace    Cardiovascular: normal heart rate noted  Respiratory: normal respiratory effort, no distress  Abdomen: gravid, soft, non-tender  Pelvic: Cervical exam  deferred         Fetal Status:     Movement: Present    Fetal Surveillance Testing today: [place] growth, BPP  [place] 16+1 wks,cephalic,fhr 096 bpm,posterior placenta gr 2,AFI 20 cm,BPP 8/8,EFW 3189 g 80%,AC 99%  Chaperone: N/A    Results for orders placed or performed in visit on 12/05/21 (from the past 24 hour(s))  POC Urinalysis Dipstick OB   Collection Time: 12/05/21  3:48 PM  Result Value Ref Range   Color, UA     Clarity, UA      Glucose, UA Negative Negative   Bilirubin, UA     Ketones, UA neg    Spec Grav, UA     Blood, UA neg    pH, UA     POC,PROTEIN,UA Trace Negative, Trace, Small (1+), Moderate (2+), Large (3+), 4+   Urobilinogen, UA     Nitrite, UA neg    Leukocytes, UA Negative Negative   Appearance     Odor       Assessment & Plan:  High-risk pregnancy: G3P1011 at 36w2dwith an Estimated Date of Delivery: 12/31/21   1) T2DM- controlled with metformin -continue with current medication -continue antepartum testing twice weekly  2) Prior C-section- repeat C-section scheduled  -continue Suboxone -GBS positive if presents in labor, plan for PCN  Meds: No orders of the defined types were placed in this encounter.   Labs/procedures today: ultrasound  Treatment Plan:  as outlined above  Reviewed: Preterm labor symptoms and general obstetric precautions including but not limited to vaginal bleeding, contractions, leaking of fluid and fetal movement were reviewed in detail with the patient.  All questions were answered. Pt has home bp cuff. Check bp weekly, let uKoreaknow if >140/90. Reviewed preeclampsia precautions  Follow-up: Return in about 1 week (around 12/12/2021) for HROB visit and ante twice weekly testing as scheduled.   Future Appointments  Date Time Provider [place] 12/05/2021  4:30 PM OJanyth Pupa DNevadaCWH-FT FTOBGYN  12/11/2021 10:50 AM [facility]-FTOBGYN NURSE [facility] FTOBGYN  12/14/2021  9:50 AM EFlorian Buff MD [facility]-FT FTOBGYN  12/18/2021  2:30 PM [facility]-FTOBGYN NURSE [facility] FTOBGYN  12/21/2021  9:50 AM OJanyth Pupa DO [facility]-FT FTOBGYN  12/25/2021 10:10 AM [facility]-FTOBGYN NURSE [facility] FTOBGYN  12/28/2021  9:50 AM BGertie Exon KRoyetta Crochet CNM [facility]-FT FTOBGYN    Orders Placed This Encounter  Procedures   POC Urinalysis Dipstick OB    JJanyth Pupa DO Attending ODubach FWes[facility] Surgery Centerfor WDe[employer] Company [place]rystal [place]

## 2021-12-05 NOTE — Progress Notes (Signed)
134/87 with patients cuff

## 2021-12-06 LAB — COMPREHENSIVE METABOLIC PANEL
ALT: 8 IU/L (ref 0–32)
AST: 8 IU/L (ref 0–40)
Albumin/Globulin Ratio: 1.5 (ref 1.2–2.2)
Albumin: 3.6 g/dL — ABNORMAL LOW (ref 3.8–4.8)
Alkaline Phosphatase: 216 IU/L — ABNORMAL HIGH (ref 44–121)
BUN/Creatinine Ratio: 14 (ref 9–23)
BUN: 7 mg/dL (ref 6–20)
Bilirubin Total: 0.3 mg/dL (ref 0.0–1.2)
CO2: 20 mmol/L (ref 20–29)
Calcium: 9 mg/dL (ref 8.7–10.2)
Chloride: 102 mmol/L (ref 96–106)
Creatinine, Ser: 0.51 mg/dL — ABNORMAL LOW (ref 0.57–1.00)
Globulin, Total: 2.4 g/dL (ref 1.5–4.5)
Glucose: 84 mg/dL (ref 70–99)
Potassium: 4.2 mmol/L (ref 3.5–5.2)
Sodium: 138 mmol/L (ref 134–144)
Total Protein: 6 g/dL (ref 6.0–8.5)
eGFR: 125 mL/min/{1.73_m2} (ref >59)

## 2021-12-06 LAB — BILE ACIDS, TOTAL: Bile Acids Total: 2.3 umol/L (ref 0.0–10.0)

## 2021-12-06 NOTE — Progress Notes (Signed)
NAS telephone consult complete.  Eat, Sleep Console reviewed and breastfeeding benefits discussed as mom expressed desire to breast feed.  All questions answered with plans for monthly telephone check ins until time of delivery.  Jason Fila, NNP-BC

## 2021-12-07 ENCOUNTER — Other Ambulatory Visit: Payer: Self-pay

## 2021-12-07 ENCOUNTER — Other Ambulatory Visit (INDEPENDENT_AMBULATORY_CARE_PROVIDER_SITE_OTHER): Payer: Medicaid Other

## 2021-12-07 ENCOUNTER — Encounter: Payer: Self-pay | Admitting: Obstetrics & Gynecology

## 2021-12-07 VITALS — BP 122/82 | HR 96 | Temp 97.5°F

## 2021-12-07 DIAGNOSIS — R319 Hematuria, unspecified: Secondary | ICD-10-CM | POA: Diagnosis not present

## 2021-12-07 DIAGNOSIS — M545 Low back pain, unspecified: Secondary | ICD-10-CM | POA: Diagnosis not present

## 2021-12-07 DIAGNOSIS — O0993 Supervision of high risk pregnancy, unspecified, third trimester: Secondary | ICD-10-CM

## 2021-12-07 DIAGNOSIS — R300 Dysuria: Secondary | ICD-10-CM

## 2021-12-07 LAB — POCT URINALYSIS DIPSTICK OB
Glucose, UA: NEGATIVE
Ketones, UA: NEGATIVE
Leukocytes, UA: NEGATIVE
Nitrite, UA: NEGATIVE

## 2021-12-07 MED ORDER — CEFADROXIL 500 MG PO CAPS: 500.0000 mg | ORAL_CAPSULE | Freq: Two times a day (BID) | ORAL | 0 refills | Status: DC

## 2021-12-07 NOTE — Progress Notes (Addendum)
° °  NURSE VISIT- UTI SYMPTOMS   SUBJECTIVE:  Bonnie Weaver is a 36 y.o. G41P1011 female here for UTI symptoms. She is [redacted]w[redacted]d pregnant. She reports dysuria, flank pain bilaterally, hematuria, and nausea.  OBJECTIVE:  BP 122/82    Pulse 96    Temp (!) 97.5 F (36.4 C)    LMP 03/26/2021 (Approximate)   Appears well, in no apparent distress  Results for orders placed or performed in visit on 12/07/21 (from the past 24 hour(s))  POC Urinalysis Dipstick OB   Collection Time: 12/07/21  3:52 PM  Result Value Ref Range   Color, UA     Clarity, UA     Glucose, UA Negative Negative   Bilirubin, UA     Ketones, UA neg    Spec Grav, UA     Blood, UA large    pH, UA     POC,PROTEIN,UA Small (1+) Negative, Trace, Small (1+), Moderate (2+), Large (3+), 4+   Urobilinogen, UA     Nitrite, UA neg    Leukocytes, UA Negative Negative   Appearance     Odor      ASSESSMENT: Pregnancy [redacted]w[redacted]d with UTI symptoms and negative nitrites  PLAN: Discussed with Nigel Berthold, CNM   Rx sent by provider today: Yes Urine culture sent Call or return to clinic prn if these symptoms worsen or fail to improve as anticipated. Follow-up: as scheduled   Alice Rieger  12/07/2021 3:55 PM  Rx duricef 500mg  BID.  If develops fever, f/u for inpt antibiotics

## 2021-12-07 NOTE — Addendum Note (Signed)
Addended by: Jacklyn Shell on: 12/07/2021 04:02 PM   Modules accepted: Orders

## 2021-12-10 LAB — URINE CULTURE

## 2021-12-11 ENCOUNTER — Other Ambulatory Visit: Payer: Self-pay

## 2021-12-11 ENCOUNTER — Ambulatory Visit (INDEPENDENT_AMBULATORY_CARE_PROVIDER_SITE_OTHER): Payer: Medicaid Other

## 2021-12-11 ENCOUNTER — Telehealth: Payer: Self-pay | Admitting: Obstetrics and Gynecology

## 2021-12-11 VITALS — BP 124/83 | HR 91 | Wt 187.8 lb

## 2021-12-11 DIAGNOSIS — Z3A37 37 weeks gestation of pregnancy: Secondary | ICD-10-CM | POA: Diagnosis not present

## 2021-12-11 DIAGNOSIS — O0993 Supervision of high risk pregnancy, unspecified, third trimester: Secondary | ICD-10-CM | POA: Diagnosis not present

## 2021-12-11 LAB — POCT URINALYSIS DIPSTICK OB
Blood, UA: NEGATIVE
Glucose, UA: NEGATIVE
Ketones, UA: NEGATIVE
Leukocytes, UA: NEGATIVE
Nitrite, UA: NEGATIVE
POC,PROTEIN,UA: NEGATIVE

## 2021-12-11 NOTE — Progress Notes (Signed)
° °  NURSE VISIT- NST  SUBJECTIVE:  Bonnie Weaver is a 36 y.o. G69P1011 female at [redacted]w[redacted]d, here for a NST for pregnancy complicated by A2DM currently on Metformin 500 mg BID .  She reports active fetal movement, contractions: none, vaginal bleeding: none, membranes: intact.   OBJECTIVE:  BP 124/83    Pulse 91    Wt 187 lb 12.8 oz (85.2 kg)    LMP 03/26/2021 (Approximate)    BMI 34.35 kg/m   Appears well, no apparent distress  Results for orders placed or performed in visit on 12/11/21 (from the past 24 hour(s))  POC Urinalysis Dipstick OB   Collection Time: 12/11/21 11:01 AM  Result Value Ref Range   Color, UA     Clarity, UA     Glucose, UA Negative Negative   Bilirubin, UA     Ketones, UA neg    Spec Grav, UA     Blood, UA neg    pH, UA     POC,PROTEIN,UA Negative Negative, Trace, Small (1+), Moderate (2+), Large (3+), 4+   Urobilinogen, UA     Nitrite, UA neg    Leukocytes, UA Negative Negative   Appearance     Odor      NST: FHR baseline 135 bpm, Variability: moderate, Accelerations:present, Decelerations:  Absent= Cat 1/reactive Toco: none   ASSESSMENT: G3P1011 at [redacted]w[redacted]d with A2DM currently on Metformin 500 MG BID NST reactive  PLAN: EFM strip interpreted and reviewed by Bonnie Weaver, CNM, [facility]   Recommendations: keep next appointment as scheduled    Bonnie Weaver A Bonnie Weaver  12/11/2021 12:06 PM   Chart reviewed for nurse visit. Agree with plan of care.  Bonnie Weaver, [place] 12/12/2021 8:50 AM

## 2021-12-11 NOTE — Telephone Encounter (Signed)
Called by [employer] for elevated blood pressure.   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 She is on ASA 162mg .   Blood pressure was 143/88. She had been seen in the office today and it was normal and she took it other times today and it was normal.   Pt denies symptoms of HA/BV/RUQ pain. She has occasional right sided pain but reports she had her gall bladder out and sometimes has this.   She has upcoming appointments scheduled Thursday.  Encouraged patient to keep those appointments and continue checking blood pressures and entering them in babyscripts. I encouraged her to recheck her blood pressure in the morning and if >140 or >90, to call the office as well.   Sunday, MD Attending Obstetrician & Gynecologist, [facility] for [facility], [facility]

## 2021-12-12 ENCOUNTER — Encounter: Payer: Self-pay | Admitting: Advanced Practice Midwife

## 2021-12-12 ENCOUNTER — Encounter (HOSPITAL_COMMUNITY): Payer: Self-pay

## 2021-12-12 NOTE — Patient Instructions (Addendum)
Bonnie Weaver ? 12/12/2021 ? ? Your procedure is scheduled on:  12/24/2021 ? Arrive at [employer] [facility] [employer] on [address] at [facility]  and [address]. You are invited to use the FREE valet parking or use the Visitor's parking deck. ? Pick up the phone at the desk and dial [phone]. ? Call this number if you have problems the morning of surgery: [phone] ? Remember: ? ? Do not eat food:(After Midnight) Desp?s de medianoche. ? Do not drink clear liquids: (After Midnight) Desp?s de medianoche. ? Take these medicines the morning of surgery with A SIP OF WATER:  Take subutex as prescribed may take phenergan if needed ? ? Do not wear jewelry, make-up or nail polish. ? Do not wear lotions, powders, or perfumes. Do not wear deodorant. ? Do not shave 48 hours prior to surgery. ? Do not bring valuables to the hospital.  [facility] is not  ? responsible for any belongings or valuables brought to the hospital. ? Contacts, dentures or bridgework may not be worn into surgery. ? Leave suitcase in the car. After surgery it may be brought to your room. ? For patients admitted to the hospital, checkout time is 11:00 AM the day of  ?            discharge. ? ?   ? Please read over the following fact sheets that you were given:  ?   Preparing for Surgery ? ? ?

## 2021-12-14 ENCOUNTER — Other Ambulatory Visit: Payer: Self-pay

## 2021-12-14 ENCOUNTER — Encounter: Payer: Self-pay | Admitting: Obstetrics & Gynecology

## 2021-12-14 ENCOUNTER — Ambulatory Visit (INDEPENDENT_AMBULATORY_CARE_PROVIDER_SITE_OTHER): Payer: Medicaid Other | Admitting: Obstetrics & Gynecology

## 2021-12-14 VITALS — BP 115/75 | HR 92 | Wt 192.0 lb

## 2021-12-14 DIAGNOSIS — Z3A37 37 weeks gestation of pregnancy: Secondary | ICD-10-CM | POA: Diagnosis not present

## 2021-12-14 DIAGNOSIS — O24419 Gestational diabetes mellitus in pregnancy, unspecified control: Secondary | ICD-10-CM

## 2021-12-14 DIAGNOSIS — O0993 Supervision of high risk pregnancy, unspecified, third trimester: Secondary | ICD-10-CM

## 2021-12-14 LAB — POCT URINALYSIS DIPSTICK OB
Blood, UA: NEGATIVE
Glucose, UA: NEGATIVE
Ketones, UA: NEGATIVE
Leukocytes, UA: NEGATIVE
Nitrite, UA: NEGATIVE
POC,PROTEIN,UA: NEGATIVE

## 2021-12-14 MED ORDER — METFORMIN HCL 1000 MG PO TABS: 1000.0000 mg | ORAL_TABLET | Freq: Two times a day (BID) | ORAL | 1 refills | Status: DC

## 2021-12-14 NOTE — Progress Notes (Signed)
? ?HIGH-RISK PREGNANCY VISIT ?Patient name: Bonnie Weaver MRN [id number]  Date of birth: Feb 13, 1986 ?Chief Complaint:   ?Routine Prenatal Visit, High Risk Gestation, and Non-stress Test ? ?History of Present Illness:   ?Bonnie Weaver is a 36 y.o. G7P1011 female at [redacted]w[redacted]d with an Estimated Date of Delivery: 12/31/21 being seen today for ongoing management of a high-risk pregnancy complicated by diabetes mellitus A2DM currently on metformin 500 BID, ^1000 BID .   ? ?Today she reports no complaints. Contractions: Irritability. Vag. Bleeding: None.  Movement: Present. denies leaking of fluid.  ? ?Depression screen [facility] 2/9 10/12/2021 06/27/2021 05/16/2021  ?Decreased Interest 0 0 0  ?Down, Depressed, Hopeless 0 0 0  ?PHQ - 2 Score 0 0 0  ?Altered sleeping 1 1 -  ?Tired, decreased energy 2 2 -  ?Change in appetite 0 0 -  ?Feeling bad or failure about yourself  0 0 -  ?Trouble concentrating 1 1 -  ?Moving slowly or fidgety/restless 0 0 -  ?Suicidal thoughts 0 0 -  ?PHQ-9 Score 4 4 -  ? ?  ?GAD 7 : Generalized Anxiety Score 10/12/2021 06/27/2021  ?Nervous, Anxious, on Edge 2 1  ?Control/stop worrying 0 1  ?Worry too much - different things 1 0  ?Trouble relaxing 0 0  ?Restless 0 0  ?Easily annoyed or irritable 1 2  ?Afraid - awful might happen 0 1  ?Total GAD 7 Score 4 5  ? ? ? ?Review of Systems:   ?Pertinent items are noted in HPI ?Denies abnormal vaginal discharge w/ itching/odor/irritation, headaches, visual changes, shortness of breath, chest pain, abdominal pain, severe nausea/vomiting, or problems with urination or bowel movements unless otherwise stated above. ?Pertinent History Reviewed:  ?Reviewed past medical,surgical, social, obstetrical and family history.  ?Reviewed problem list, medications and allergies. ?Physical Assessment:  ? ?Vitals:  ? 12/14/21 1003  ?BP: 115/75  ?Pulse: 92  ?Weight: 192 lb (87.1 kg)  ?Body mass index is 35.12 kg/m?. ?     ?     Physical Examination:  ? General appearance: alert, well  appearing, and in no distress ? Mental status: alert, oriented to person, place, and time ? Skin: warm & dry  ? Extremities: Edema: Mild pitting, slight indentation  ?  Cardiovascular: normal heart rate noted ? Respiratory: normal respiratory effort, no distress ? Abdomen: gravid, soft, non-tender ? Pelvic: Cervical exam deferred        ? ?Fetal Status:     Movement: Present   ? ?Fetal Surveillance Testing today: Reactive NST ? ?Bonnie Weaver is at [redacted]w[redacted]d Estimated Date of Delivery: 12/31/21  ?NST being performed due to A2DM ? ?Today the NST is Reactive ? ?Fetal Monitoring:  Baseline: 140 bpm, Variability: Good {> 6 bpm), Accelerations: Reactive, and Decelerations: Absent   reactive ? ?The accelerations are >15 bpm and more than 2 in 20 minutes ? ?Final diagnosis:  Reactive NST ? ?Florian Buff, MD  ?  ? ?Chaperone: N/A   ? ?Results for orders placed or performed in visit on 12/14/21 (from the past 24 hour(s))  ?POC Urinalysis Dipstick OB  ? Collection Time: 12/14/21 10:22 AM  ?Result Value Ref Range  ? Color, UA    ? Clarity, UA    ? Glucose, UA Negative Negative  ? Bilirubin, UA    ? Ketones, UA neg   ? Spec Grav, UA    ? Blood, UA neg   ? pH, UA    ? POC,PROTEIN,UA Negative Negative,  Trace, Small (1+), Moderate (2+), Large (3+), 4+  ? Urobilinogen, UA    ? Nitrite, UA neg   ? Leukocytes, UA Negative Negative  ? Appearance    ? Odor    ?  ?Assessment & Plan:  ?High-risk pregnancy: G3P1011 at [redacted]w[redacted]d with an Estimated Date of Delivery: 12/31/21  ? ?1) A2DM, metformin 1000 BID  ? ? ? ?Meds:  ?Meds ordered this encounter  ?Medications  ? metFORMIN (GLUCOPHAGE) 1000 MG tablet  ?  Sig: Take 1 tablet (1,000 mg total) by mouth 2 (two) times daily with a meal.  ?  Dispense:  60 tablet  ?  Refill:  1  ? ? ?Labs/procedures today: NST ? ?Treatment Plan:  Twice weekly NSY, C section Dr Nelda Marseille 3/12 ? ?Reviewed: Preterm labor symptoms and general obstetric precautions including but not limited to vaginal bleeding, contractions,  leaking of fluid and fetal movement were reviewed in detail with the patient.  All questions were answered. Does have home bp cuff. Office bp cuff given: not applicable. Check bp daily, let us know if consistently >140 and/or >90. ? ?Follow-up: Return for keep scheduled. ? ? ?Future Appointments  ?Date Time Provider [place]  ?12/18/2021  2:30 PM [facility] NURSE [facility]  ?12/21/2021 10:10 AM Janyth Pupa, DO [facility]  ?12/22/2021  9:00 AM [facility] PAT 1 [facility] None  ?12/25/2021 10:10 AM [facility] NURSE [facility]  ?12/28/2021  9:50 AM Roma Schanz, CNM [facility]  ? ? ?Orders Placed This Encounter  ?Procedures  ? POC Urinalysis Dipstick OB  ? ?Mertie Clause Kyrstyn Greear  ?12/14/2021 ?10:44 AM ? ?

## 2021-12-18 ENCOUNTER — Other Ambulatory Visit: Payer: Self-pay

## 2021-12-18 ENCOUNTER — Ambulatory Visit (INDEPENDENT_AMBULATORY_CARE_PROVIDER_SITE_OTHER): Payer: Medicaid Other

## 2021-12-18 VITALS — BP 126/80 | HR 100 | Wt 190.0 lb

## 2021-12-18 DIAGNOSIS — Z3689 Encounter for other specified antenatal screening: Secondary | ICD-10-CM

## 2021-12-18 DIAGNOSIS — Z1389 Encounter for screening for other disorder: Secondary | ICD-10-CM

## 2021-12-18 DIAGNOSIS — O0993 Supervision of high risk pregnancy, unspecified, third trimester: Secondary | ICD-10-CM

## 2021-12-18 DIAGNOSIS — Z331 Pregnant state, incidental: Secondary | ICD-10-CM

## 2021-12-18 LAB — POCT URINALYSIS DIPSTICK OB
Blood, UA: NEGATIVE
Glucose, UA: NEGATIVE
Ketones, UA: NEGATIVE
Leukocytes, UA: NEGATIVE
Nitrite, UA: NEGATIVE
POC,PROTEIN,UA: NEGATIVE

## 2021-12-18 NOTE — Progress Notes (Addendum)
? ?  NURSE VISIT- NST ? ?SUBJECTIVE:  ?Bonnie Weaver is a 36 y.o. G62P1011 female at [redacted]w[redacted]d, here for a NST for pregnancy complicated by A2DM currently on Metformin 1000 mg BID .  She reports active fetal movement, contractions: none, vaginal bleeding: none, membranes: intact.  ? ?OBJECTIVE:  ?BP 126/80   Pulse 100   Wt 190 lb (86.2 kg)   LMP 03/26/2021 (Approximate)   BMI 34.75 kg/m?   ?Appears well, no apparent distress ? ?No results found for this or any previous visit (from the past 24 hour(s)). ? ?NST: FHR baseline 155 bpm, Variability: moderate, Accelerations:present, Decelerations:  Absent= Cat 1/reactive ?Toco: none  ? ?ASSESSMENT: ?G3P1011 at [redacted]w[redacted]d with A2DM currently on Metformin 1000 mg BID ?NST reactive ? ?PLAN: ?EFM strip interpreted & reviewed by Joellyn Haff, CNM, [facility]   ?Recommendations: keep next appointment as scheduled   ? ?Dannell Raczkowski A Toriana Sponsel  ?12/18/2021 ?4:09 PM  ? ?Chart reviewed for nurse visit. Agree with plan of care.  ?Cheral Marker, CNM ?12/18/2021 4:28 PM   ?

## 2021-12-19 ENCOUNTER — Other Ambulatory Visit: Payer: Self-pay

## 2021-12-19 ENCOUNTER — Encounter: Payer: Self-pay | Admitting: Women's Health

## 2021-12-19 MED ORDER — PROMETHAZINE HCL 25 MG PO TABS: 25.0000 mg | ORAL_TABLET | Freq: Four times a day (QID) | ORAL | 1 refills | Status: DC | PRN

## 2021-12-21 ENCOUNTER — Other Ambulatory Visit: Payer: Self-pay

## 2021-12-21 ENCOUNTER — Encounter: Payer: Self-pay | Admitting: Obstetrics & Gynecology

## 2021-12-21 ENCOUNTER — Encounter (HOSPITAL_COMMUNITY): Payer: Self-pay | Admitting: Obstetrics & Gynecology

## 2021-12-21 ENCOUNTER — Ambulatory Visit (INDEPENDENT_AMBULATORY_CARE_PROVIDER_SITE_OTHER): Payer: Medicaid Other | Admitting: Obstetrics & Gynecology

## 2021-12-21 VITALS — BP 123/79 | HR 90 | Wt 192.0 lb

## 2021-12-21 DIAGNOSIS — O0993 Supervision of high risk pregnancy, unspecified, third trimester: Secondary | ICD-10-CM | POA: Diagnosis not present

## 2021-12-21 DIAGNOSIS — O24415 Gestational diabetes mellitus in pregnancy, controlled by oral hypoglycemic drugs: Secondary | ICD-10-CM

## 2021-12-21 DIAGNOSIS — Z331 Pregnant state, incidental: Secondary | ICD-10-CM

## 2021-12-21 DIAGNOSIS — O288 Other abnormal findings on antenatal screening of mother: Secondary | ICD-10-CM

## 2021-12-21 DIAGNOSIS — Z1389 Encounter for screening for other disorder: Secondary | ICD-10-CM

## 2021-12-21 LAB — POCT URINALYSIS DIPSTICK OB
Glucose, UA: NEGATIVE
Ketones, UA: NEGATIVE
Leukocytes, UA: NEGATIVE
Nitrite, UA: NEGATIVE
POC,PROTEIN,UA: NEGATIVE

## 2021-12-21 NOTE — Progress Notes (Signed)
? ?HIGH-RISK PREGNANCY VISIT ?Patient name: Bonnie Weaver MRN [id number]  Date of birth: Jun 11, 1986 ?Chief Complaint:   ?High Risk Gestation and Routine Prenatal Visit ? ?History of Present Illness:   ?Bonnie Weaver is a 36 y.o. G97P1011 female at 34w4dwith an Estimated Date of Delivery: 12/31/21 being seen today for ongoing management of a high-risk pregnancy complicated by: ?GDMA2- doing better with metformin 10061mbid.   ? ?Today she reports no complaints.  ? ?Contractions: Not present.  Denies vaginal bleeding.  Movement: Present. denies leaking of fluid.  ? ?[facility]/9 10/12/2021 06/27/2021 05/16/2021  ?Decreased Interest 0 0 0  ?Down, Depressed, Hopeless 0 0 0  ?PHQ - 2 Score 0 0 0  ?Altered sleeping 1 1 -  ?Tired, decreased energy 2 2 -  ?Change in appetite 0 0 -  ?Feeling bad or failure about yourself  0 0 -  ?Trouble concentrating 1 1 -  ?Moving slowly or fidgety/restless 0 0 -  ?Suicidal thoughts 0 0 -  ?PHQ-9 Score 4 4 -  ? ? ? ?Current Outpatient Medications  ?Medication Instructions  ? Accu-Chek Softclix Lancets lancets Check blood sugar four times daily.  ? acetaminophen (TYLENOL) 650 mg, Oral, Every 8 hours PRN  ? acyclovir (ZOVIRAX) 800 mg, Oral, Daily  ? albuterol (VENTOLIN HFA) 108 (90 Base) MCG/ACT inhaler 2 puffs, Every 6 hours PRN  ? aspirin 162 mg, Oral, Daily, Swallow whole.  ? Blood Glucose Monitoring Suppl (ACCU-CHEK GUIDE ME) w/Device KIT 1 kit, Does not apply, 4 times daily  ? Blood Pressure Monitor MISC For regular home bp monitoring during pregnancy  ? buprenorphine (SUBUTEX) 4 mg, Sublingual, 4 times daily  ? desvenlafaxine (PRISTIQ) 25 mg, Oral, Daily  ? Doxylamine-Pyridoxine (DICLEGIS) 10-10 MG TBEC Take 2 qhs; may also take one in am and one in afternoon prn nausea  ? ferrous sulfate 325 mg, Oral, Every other day  ? glucose blood (ACCU-CHEK GUIDE) test strip Check blood sugar four times a day.  ? lamoTRIgine (LAMICTAL) 150 mg, 2 times daily  ? metFORMIN (GLUCOPHAGE)  1,000 mg, Oral, 2 times daily with meals  ? omeprazole (PRILOSEC) 20 mg, Oral, Daily PRN  ? Prenatal Vit-Fe Fumarate-FA (PRENATAL VITAMIN PO) 1 tablet, Oral, Daily  ? promethazine (PHENERGAN) 25 mg, Oral, Every 6 hours PRN  ? Trintellix 10 mg, Oral, Daily  ?  ? ?Review of Systems:   ?Pertinent items are noted in HPI ?Denies abnormal vaginal discharge w/ itching/odor/irritation, headaches, visual changes, shortness of breath, chest pain, abdominal pain, severe nausea/vomiting, or problems with urination or bowel movements unless otherwise stated above. ?Pertinent History Reviewed:  ?Reviewed past medical,surgical, social, obstetrical and family history.  ?Reviewed problem list, medications and allergies. ?Physical Assessment:  ? ?Vitals:  ? 12/21/21 1027  ?BP: 123/79  ?Pulse: 90  ?Weight: 192 lb (87.1 kg)  ?Body mass index is 35.12 kg/m?. ?     ?     Physical Examination:  ? General appearance: alert, well appearing, and in no distress ? Mental status: normal mood, behavior, speech, dress, motor activity, and thought processes ? Skin: warm & dry  ? Extremities: Edema: Trace  ?  Cardiovascular: normal heart rate noted ? Respiratory: normal respiratory effort, no distress ? Abdomen: gravid, soft, non-tender ? Pelvic: Cervical exam deferred        ? ?Fetal Status:     Movement: Present   ? ?Fetal Surveillance Testing today: NST ? ?NST being performed [place] ? ?  Fetal Monitoring:  Baseline: 150 bpm, Variability: moderate, Accelerations: present, The accelerations are >15 bpm and more than 2 in 20 minutes, and Decelerations: Absent   ? ? ?Final diagnosis:  Reactive NST ? ? ?Chaperone: N/A   ? ?Results for orders placed or performed in visit on 12/21/21 (from the past 24 hour(s))  ?POC Urinalysis Dipstick OB  ? Collection Time: 12/21/21 10:38 AM  ?Result Value Ref Range  ? Color, UA    ? Clarity, UA    ? Glucose, UA Negative Negative  ? Bilirubin, UA    ? Ketones, UA neg   ? Spec Grav, UA    ? Blood, UA small   ?  pH, UA    ? POC,PROTEIN,UA Negative Negative, Trace, Small (1+), Moderate (2+), Large (3+), 4+  ? Urobilinogen, UA    ? Nitrite, UA neg   ? Leukocytes, UA Negative Negative  ? Appearance    ? Odor    ?  ? ?Assessment & Plan:  ?High-risk pregnancy: G3P1011 at 15w4dwith an Estimated Date of Delivery: 12/31/21  ? ?1) GDMA2 ?-continue metformin 10057mbid ?-growth has been folowed q 4wks ?-IOL by C-section scheduled for this Sunday ? ?2) SUD ?-doing well with suboxone ?-desires to avoid narcotics postpartum ? ?3) Anxiety/Depression ?-continue current meds ? ?Meds: No orders of the defined types were placed in this encounter. ? ? ?Labs/procedures today: NST ? ?Treatment Plan:  as outlined above ? ?Reviewed: Term labor symptoms and general obstetric precautions including but not limited to vaginal bleeding, contractions, leaking of fluid and fetal movement were reviewed in detail with the patient.  All questions were answered. Pt has home bp cuff. Check bp weekly, let usKoreanow if >140/90.  ? ?Follow-up: No follow-ups on file. ? ? ?Future Appointments  ?Date Time Provider [place]?12/22/2021  9:00 AM [facility] PAT 1 [facility] None  ? ? ?Orders Placed This Encounter  ?Procedures  ? POC Urinalysis Dipstick OB  ? ? ?JeJanyth PupaDO ?Attending [facility] [place] ? ? ?

## 2021-12-22 ENCOUNTER — Other Ambulatory Visit: Payer: Self-pay | Admitting: Obstetrics & Gynecology

## 2021-12-22 ENCOUNTER — Other Ambulatory Visit (HOSPITAL_COMMUNITY)
Admission: RE | Admit: 2021-12-22 | Discharge: 2021-12-22 | Disposition: A | Discharge status: Home / Self Care | Payer: Medicaid Other | Source: Ambulatory Visit | Attending: Obstetrics & Gynecology | Admitting: Obstetrics & Gynecology

## 2021-12-22 DIAGNOSIS — O0993 Supervision of high risk pregnancy, unspecified, third trimester: Secondary | ICD-10-CM

## 2021-12-22 DIAGNOSIS — Z01818 Encounter for other preprocedural examination: Secondary | ICD-10-CM

## 2021-12-22 DIAGNOSIS — Z01812 Encounter for preprocedural laboratory examination: Secondary | ICD-10-CM | POA: Insufficient documentation

## 2021-12-22 DIAGNOSIS — Z20822 Contact with and (suspected) exposure to covid-19: Secondary | ICD-10-CM | POA: Insufficient documentation

## 2021-12-22 DIAGNOSIS — Z3A00 Weeks of gestation of pregnancy not specified: Secondary | ICD-10-CM | POA: Insufficient documentation

## 2021-12-22 HISTORY — DX: Encounter for other specified aftercare: Z51.89

## 2021-12-22 HISTORY — DX: Other complications of anesthesia, initial encounter: T88.59XA

## 2021-12-22 HISTORY — DX: Supervision of pregnancy with other poor reproductive or obstetric history, unspecified trimester: O09.299

## 2021-12-22 LAB — CBC
HCT: 32.8 % — ABNORMAL LOW (ref 36.0–46.0)
Hemoglobin: 11 g/dL — ABNORMAL LOW (ref 12.0–15.0)
MCH: 30.1 pg (ref 26.0–34.0)
MCHC: 33.5 g/dL (ref 30.0–36.0)
MCV: 89.9 fL (ref 80.0–100.0)
Platelets: 315 10*3/uL (ref 150–400)
RBC: 3.65 MIL/uL — ABNORMAL LOW (ref 3.87–5.11)
RDW: 14.1 % (ref 11.5–15.5)
WBC: 12.7 10*3/uL — ABNORMAL HIGH (ref 4.0–10.5)
nRBC: 0 % (ref 0.0–0.2)

## 2021-12-22 LAB — RPR: RPR Ser Ql: NONREACTIVE

## 2021-12-22 LAB — SARS CORONAVIRUS 2 (TAT 6-24 HRS): SARS Coronavirus 2: NEGATIVE

## 2021-12-22 LAB — TYPE AND SCREEN
ABO/RH(D): O POS
Antibody Screen: NEGATIVE

## 2021-12-23 ENCOUNTER — Encounter (HOSPITAL_COMMUNITY): Payer: Self-pay | Admitting: Obstetrics & Gynecology

## 2021-12-23 NOTE — H&P (Signed)
OBSTETRIC ADMISSION HISTORY AND PHYSICAL  Bonnie Weaver is a 36 y.o. female G22P1011 with IUP at 82w6dby LMP presenting for scheduled repeat cesarean section. She reports +FMs, No LOF, no VB, no blurry vision, headaches or peripheral edema, and RUQ pain.  She plans on breast and bottle feeding. She requests BTL for birth control. Patient did not yet sign BTL paperwork but however since having a cesarean section offered BTL and patient would like to move forward with getting BTL during cesarean section. [facility]  Dating: By LMP --->  Estimated Date of Delivery: 12/31/21  Sono:   _0 , CWD, normal anatomy, cephalic presentation, posterior placental lie, 3189g, 80%ile EFW AC 99%ile   Prenatal History/Complications:  [id number] Metformin Hx of preE in prior pregnancy History of anxiety/depression Substance Use on Suboxone  Past Medical History: Past Medical History:  Diagnosis Date   Anxiety    Asthma    [place]ipolar 1 [place]    Blood transfusion without reported diagnosis    post partum unsure if hemorrhage or not   Complication of anesthesia    aspirated with CS   Depression    History of kidney stones    History of pre-eclampsia in prior pregnancy, currently pregnant    HSV (herpes simplex virus) anogenital infection     Past Surgical History: Past Surgical History:  Procedure Laterality Date   BREAST BIOPSY Right    CESAREAN SECTION     CHOLECYSTECTOMY N/A 08/18/2021   Procedure: LAPAROSCOPIC CHOLECYSTECTOMY;  Surgeon: JAviva Signs MD;  Location: [facility];  Service: General;  Laterality: N/A;   LITHOTRIPSY     MULTIPLE TOOTH EXTRACTIONS      Obstetrical History: OB History     Gravida  3   Para  1   Term  1   Preterm      AB  1   Living  1      SAB      IAB      Ectopic      Multiple      Live Births  1           Social History Social History   Socioeconomic History   Marital status: Single     Spouse name: Not on file   Number of children: Not on file   Years of education: Not on file   Highest education level: Not on file  Occupational History   Not on file  Tobacco Use   Smoking status: Former    Packs/day: 0.25    Years: 15.00    Pack years: 3.75    Types: Cigarettes   Smokeless tobacco: Never  Vaping Use   Vaping Use: Never used  Substance and Sexual Activity   Alcohol use: No   Drug use: Not Currently   Sexual activity: Yes    Birth control/protection: None  Other Topics Concern   Not on file  Social History Narrative   Not on file   Social Determinants of Health   Financial Resource Strain: Low Risk    Difficulty of Paying Living Expenses: Not hard at all  Food Insecurity: No Food Insecurity   Worried About RCharity fundraiserin the Last Year: Never true   Ran Out of Food in the Last Year: Never true  Transportation Needs: No Transportation Needs   Lack of Transportation (Medical): No   Lack of Transportation (Non-Medical): No  Physical Activity: Sufficiently Active  Days of Exercise per Week: 5 days   Minutes of Exercise per Session: 60 min  Stress: No Stress Concern Present   Feeling of Stress : Only a little  Social Connections: Moderately Integrated   Frequency of Communication with Friends and Family: More than three times a week   Frequency of Social Gatherings with Friends and Family: Once a week   Attends Religious Services: More than 4 times per year   Active Member of [employer] or Organizations: Yes   Attends Music therapist: More than 4 times per year   Marital Status: Never married    Family History: Family History  Problem Relation Age of Onset   Other Mother        sepsis   Hypertension Father    Autoimmune disease Father    Sleep apnea Father    Asthma Sister    Diabetes Sister    Anxiety disorder Daughter    Healthy Daughter    Diabetes Maternal Grandmother    Lung cancer Maternal Grandfather    Rheum  arthritis Paternal Grandmother     Allergies: Allergies  Allergen Reactions   Sulfa Antibiotics Anaphylaxis and Hives   Codeine Rash    Medications Prior to Admission  Medication Sig Dispense Refill Last Dose   acetaminophen (TYLENOL) 325 MG tablet Take 650 mg by mouth every 8 (eight) hours as needed (pain).      acyclovir (ZOVIRAX) 800 MG tablet Take 800 mg by mouth daily.      albuterol (VENTOLIN HFA) 108 (90 Base) MCG/ACT inhaler 2 puffs every 6 (six) hours as needed for shortness of breath or wheezing. (Patient not taking: Reported on 12/18/2021)      aspirin 81 MG EC tablet Take 2 tablets (162 mg total) by mouth daily. Swallow whole. 180 tablet 2    buprenorphine (SUBUTEX) 8 MG SUBL SL tablet Place 4 mg under the tongue in the morning, at noon, in the evening, and at bedtime.   12/24/2021 at 0630   desvenlafaxine (PRISTIQ) 25 MG 24 hr tablet Take 25 mg by mouth daily.      diphenhydrAMINE (BENADRYL) 25 mg capsule Take 25 mg by mouth every 6 (six) hours as needed.   12/23/2021   Doxylamine-Pyridoxine (DICLEGIS) 10-10 MG TBEC Take 2 qhs; may also take one in am and one in afternoon prn nausea (Patient taking differently: Take 2 capsules by mouth at bedtime.) 120 tablet 6    ferrous sulfate 325 (65 FE) MG tablet Take 1 tablet (325 mg total) by mouth every other day. (Patient taking differently: Take 325 mg by mouth daily.) 45 tablet 2    metFORMIN (GLUCOPHAGE) 1000 MG tablet Take 1 tablet (1,000 mg total) by mouth 2 (two) times daily with a meal. 60 tablet 1    omeprazole (PRILOSEC) 20 MG capsule Take 20 mg by mouth daily as needed (acid reflux).      Prenatal Vit-Fe Fumarate-FA (PRENATAL VITAMIN PO) Take 1 tablet by mouth daily.      promethazine (PHENERGAN) 25 MG tablet Take 1 tablet (25 mg total) by mouth every 6 (six) hours as needed for nausea or vomiting. 30 tablet 1 12/24/2021 at 0600   TRINTELLIX 10 MG TABS tablet Take 10 mg by mouth daily.      Accu-Chek Softclix Lancets lancets Check  blood sugar four times daily. 100 each 12    Blood Glucose Monitoring Suppl (ACCU-CHEK GUIDE ME) w/Device KIT 1 kit by Does not apply route in the morning,  at noon, in the evening, and at bedtime. 1 kit 0    Blood Pressure Monitor MISC For regular home bp monitoring during pregnancy 1 each 0    glucose blood (ACCU-CHEK GUIDE) test strip Check blood sugar four times a day. 50 each 12    lamoTRIgine (LAMICTAL) 150 MG tablet Take 150 mg by mouth 2 (two) times daily. (Patient not taking: Reported on 12/18/2021)        Review of Systems   All systems reviewed and negative except as stated in HPI  Blood pressure 126/87, pulse (!) 107, temperature 98.2 F (36.8 C), temperature source Oral, resp. rate 16, height _0  (1.575 m), weight 88 kg, last menstrual period 03/26/2021. General appearance: alert Lungs: clear to auscultation bilaterally Heart: regular rate and rhythm Abdomen: soft, non-tender; bowel sounds normal Extremities: Homans sign is negative, no sign of DVT      Prenatal labs: ABO, Rh: --/--/O POS (03/10 1540) Antibody: NEG (03/10 0943) Rubella: 11.70 (09/13 1556) RPR: NON REACTIVE (03/10 1106)  HBsAg: Negative (09/13 1556)  HIV: Non Reactive (12/29 0926)  GBS: Positive/-- (02/09 1415)  2 hr Glucola abnormal Genetic screening  LR female Anatomy US normal  Prenatal Transfer Tool  Maternal Diabetes: Yes:  Diabetes Type:  Insulin/Medication controlled Genetic Screening: Normal Maternal Ultrasounds/Referrals: Normal Fetal Ultrasounds or other Referrals:  None Maternal Substance Abuse:  No Significant Maternal Medications:  Meds include: Other: Metformin Significant Maternal Lab Results: Group B Strep positive  Results for orders placed or performed during the hospital encounter of 12/24/21 (from the past 24 hour(s))  Glucose, capillary   Collection Time: 12/24/21  8:42 AM  Result Value Ref Range   Glucose-Capillary 97 70 - 99 mg/dL    Patient Active Problem List    Diagnosis Date Noted   Intrauterine pregnancy 12/24/2021   Gestational diabetes 10/17/2021   Calculus of gallbladder without cholecystitis without obstruction    Supervision of high-risk pregnancy 06/27/2021   History of cesarean delivery 06/27/2021   History of pre-eclampsia in prior pregnancy, currently pregnant 06/27/2021   History of opioid abuse ([place]) 06/27/2021   Suboxone maintenance treatment complicating pregnancy, antepartum ([place]) 06/27/2021   Bipolar, Dep/anx, ADHD 06/27/2021   Smoker 06/27/2021   Pruritus 04/21/2020   Keratosis pilaris 03/15/2020   Dyshidrotic eczema 03/15/2020   Seborrheic keratosis 03/15/2020   Seborrheic dermatitis 03/15/2020   History of asthma 09/21/2019   Genital herpes simplex 09/21/2019    Assessment/Plan:  SEMIAH KONCZAL is a 36 y.o. G3P1011 at 46w6dhere for scheduled repeat cesarean section.   #Scheduled repeat cesarean section #Unwanted fertility The risks of cesarean section were discussed with the patient including but were not limited to: bleeding which may require transfusion or reoperation; infection which may require antibiotics; injury to bowel, bladder, ureters or other surrounding organs; injury to the fetus; need for additional procedures including hysterectomy in the event of a life-threatening hemorrhage; placental abnormalities wth subsequent pregnancies, incisional problems, thromboembolic phenomenon and other postoperative/anesthesia complications.  Patient also desires permanent sterilization.  Other reversible forms of contraception were discussed with patient; she declines all other modalities. Risks of procedure discussed with patient including but not limited to: risk of regret, permanence of method, bleeding, infection, injury to surrounding organs and need for additional procedures.  Failure risk of about 1% with increased risk of ectopic gestation if pregnancy occurs was also discussed with patient.  Also discussed possibility  of post-tubal pain syndrome. The patient concurred with the proposed plan, giving informed  written consent for the procedures.  Patient has been NPO since 11PM she will remain NPO for procedure. Anesthesia and OR aware.  Preoperative prophylactic antibiotics and SCDs ordered on call to the OR.  To OR when ready.   #GDM On Metformin. EFW 80%ile. AC >99%ile. AFI normal (20cm). Will plan for FBG on POD#1  #Hx of preE with prior pregnancy Unsure if had to get Mg. BP normotensive at this time. Did have headache this morning that has resolved. Admission CBC with normal platelets. Will get CMP and protein:creatinine   #Substance use disorder On Suboxone. Will continue home dose while inpatient  #Pain: spinal #ID:  GBS pos > will get pre-op antibiotics #MOF: breast #MOC: BTL (patient opts for BTL. Did not signs papers yet but given cesarean section discussed possibility of doing BTL during surgery. We discussed patient may get a bill from pathology and patient expressed understanding and would like to move forward with BTL) #Circ:  N/A  [name], MD, MPH OB Fellow, Faculty [facility]

## 2021-12-24 ENCOUNTER — Inpatient Hospital Stay (HOSPITAL_COMMUNITY): Payer: Medicaid Other | Admitting: Anesthesiology

## 2021-12-24 ENCOUNTER — Other Ambulatory Visit: Payer: Self-pay

## 2021-12-24 ENCOUNTER — Encounter (HOSPITAL_COMMUNITY)
Admission: RE | Disposition: A | Discharge status: Home / Self Care | Payer: Self-pay | Source: Home / Self Care | Attending: Obstetrics & Gynecology

## 2021-12-24 ENCOUNTER — Encounter (HOSPITAL_COMMUNITY): Payer: Self-pay | Admitting: Obstetrics & Gynecology

## 2021-12-24 ENCOUNTER — Inpatient Hospital Stay (HOSPITAL_COMMUNITY)
Admission: RE | Admit: 2021-12-24 | Discharge: 2021-12-27 | DRG: 784 | Disposition: A | Discharge status: Home / Self Care | Payer: Medicaid Other | Attending: Obstetrics & Gynecology | Admitting: Obstetrics & Gynecology

## 2021-12-24 DIAGNOSIS — O34211 Maternal care for low transverse scar from previous cesarean delivery: Secondary | ICD-10-CM | POA: Diagnosis present

## 2021-12-24 DIAGNOSIS — J45909 Unspecified asthma, uncomplicated: Secondary | ICD-10-CM | POA: Diagnosis present

## 2021-12-24 DIAGNOSIS — Z98891 History of uterine scar from previous surgery: Secondary | ICD-10-CM

## 2021-12-24 DIAGNOSIS — O0993 Supervision of high risk pregnancy, unspecified, third trimester: Principal | ICD-10-CM

## 2021-12-24 DIAGNOSIS — F1110 Opioid abuse, uncomplicated: Secondary | ICD-10-CM | POA: Diagnosis present

## 2021-12-24 DIAGNOSIS — O24419 Gestational diabetes mellitus in pregnancy, unspecified control: Secondary | ICD-10-CM | POA: Diagnosis present

## 2021-12-24 DIAGNOSIS — Z3A38 38 weeks gestation of pregnancy: Secondary | ICD-10-CM | POA: Diagnosis not present

## 2021-12-24 DIAGNOSIS — Z302 Encounter for sterilization: Secondary | ICD-10-CM

## 2021-12-24 DIAGNOSIS — Z01818 Encounter for other preprocedural examination: Secondary | ICD-10-CM

## 2021-12-24 DIAGNOSIS — F32A Depression, unspecified: Secondary | ICD-10-CM | POA: Diagnosis present

## 2021-12-24 DIAGNOSIS — D62 Acute posthemorrhagic anemia: Secondary | ICD-10-CM | POA: Diagnosis not present

## 2021-12-24 DIAGNOSIS — F17200 Nicotine dependence, unspecified, uncomplicated: Secondary | ICD-10-CM | POA: Diagnosis present

## 2021-12-24 DIAGNOSIS — Z7982 Long term (current) use of aspirin: Secondary | ICD-10-CM

## 2021-12-24 DIAGNOSIS — O99320 Drug use complicating pregnancy, unspecified trimester: Secondary | ICD-10-CM

## 2021-12-24 DIAGNOSIS — O99820 Streptococcus B carrier state complicating pregnancy: Secondary | ICD-10-CM | POA: Diagnosis not present

## 2021-12-24 DIAGNOSIS — O24424 Gestational diabetes mellitus in childbirth, insulin controlled: Secondary | ICD-10-CM | POA: Diagnosis not present

## 2021-12-24 DIAGNOSIS — A6000 Herpesviral infection of urogenital system, unspecified: Secondary | ICD-10-CM | POA: Diagnosis present

## 2021-12-24 DIAGNOSIS — O24425 Gestational diabetes mellitus in childbirth, controlled by oral hypoglycemic drugs: Secondary | ICD-10-CM | POA: Diagnosis present

## 2021-12-24 DIAGNOSIS — Z23 Encounter for immunization: Secondary | ICD-10-CM | POA: Diagnosis not present

## 2021-12-24 DIAGNOSIS — Z87891 Personal history of nicotine dependence: Secondary | ICD-10-CM

## 2021-12-24 DIAGNOSIS — O9952 Diseases of the respiratory system complicating childbirth: Secondary | ICD-10-CM | POA: Diagnosis present

## 2021-12-24 DIAGNOSIS — F99 Mental disorder, not otherwise specified: Secondary | ICD-10-CM

## 2021-12-24 DIAGNOSIS — O9081 Anemia of the puerperium: Secondary | ICD-10-CM | POA: Diagnosis not present

## 2021-12-24 DIAGNOSIS — O99824 Streptococcus B carrier state complicating childbirth: Secondary | ICD-10-CM | POA: Diagnosis present

## 2021-12-24 DIAGNOSIS — F1120 Opioid dependence, uncomplicated: Secondary | ICD-10-CM

## 2021-12-24 DIAGNOSIS — F909 Attention-deficit hyperactivity disorder, unspecified type: Secondary | ICD-10-CM | POA: Diagnosis present

## 2021-12-24 DIAGNOSIS — Z3490 Encounter for supervision of normal pregnancy, unspecified, unspecified trimester: Secondary | ICD-10-CM

## 2021-12-24 DIAGNOSIS — Z3A39 39 weeks gestation of pregnancy: Secondary | ICD-10-CM | POA: Diagnosis not present

## 2021-12-24 DIAGNOSIS — O9832 Other infections with a predominantly sexual mode of transmission complicating childbirth: Secondary | ICD-10-CM | POA: Diagnosis present

## 2021-12-24 DIAGNOSIS — O99324 Drug use complicating childbirth: Secondary | ICD-10-CM | POA: Diagnosis present

## 2021-12-24 DIAGNOSIS — F419 Anxiety disorder, unspecified: Secondary | ICD-10-CM | POA: Diagnosis present

## 2021-12-24 DIAGNOSIS — Z8709 Personal history of other diseases of the respiratory system: Secondary | ICD-10-CM

## 2021-12-24 DIAGNOSIS — O99344 Other mental disorders complicating childbirth: Secondary | ICD-10-CM | POA: Diagnosis present

## 2021-12-24 DIAGNOSIS — O09299 Supervision of pregnancy with other poor reproductive or obstetric history, unspecified trimester: Secondary | ICD-10-CM

## 2021-12-24 DIAGNOSIS — Z8632 Personal history of gestational diabetes: Secondary | ICD-10-CM | POA: Diagnosis present

## 2021-12-24 DIAGNOSIS — O0990 Supervision of high risk pregnancy, unspecified, unspecified trimester: Secondary | ICD-10-CM

## 2021-12-24 HISTORY — DX: Anogenital herpesviral infection, unspecified: A60.9

## 2021-12-24 HISTORY — PX: TUBAL LIGATION: SHX77

## 2021-12-24 HISTORY — DX: Depression, unspecified: F32.A

## 2021-12-24 LAB — COMPREHENSIVE METABOLIC PANEL
ALT: 14 U/L (ref 0–44)
AST: 18 U/L (ref 15–41)
Albumin: 2.2 g/dL — ABNORMAL LOW (ref 3.5–5.0)
Alkaline Phosphatase: 220 U/L — ABNORMAL HIGH (ref 38–126)
Anion gap: 10 (ref 5–15)
BUN: 8 mg/dL (ref 6–20)
CO2: 20 mmol/L — ABNORMAL LOW (ref 22–32)
Calcium: 8.3 mg/dL — ABNORMAL LOW (ref 8.9–10.3)
Chloride: 105 mmol/L (ref 98–111)
Creatinine, Ser: 0.6 mg/dL (ref 0.44–1.00)
GFR, Estimated: >60 mL/min (ref >60)
Glucose, Bld: 90 mg/dL (ref 70–99)
Potassium: 3.8 mmol/L (ref 3.5–5.1)
Sodium: 135 mmol/L (ref 135–145)
Total Bilirubin: 0.5 mg/dL (ref 0.3–1.2)
Total Protein: 6.1 g/dL — ABNORMAL LOW (ref 6.5–8.1)

## 2021-12-24 LAB — PROTEIN / CREATININE RATIO, URINE
Creatinine, Urine: 53.75 mg/dL
Protein Creatinine Ratio: 0.2 mg/mg{Cre} — ABNORMAL HIGH (ref 0.00–0.15)
Total Protein, Urine: 11 mg/dL

## 2021-12-24 LAB — GLUCOSE, CAPILLARY
Glucose-Capillary: 97 mg/dL (ref 70–99)
Glucose-Capillary: 103 mg/dL — ABNORMAL HIGH (ref 70–99)

## 2021-12-24 SURGERY — Surgical Case
Anesthesia: Spinal

## 2021-12-24 MED ORDER — BUPIVACAINE IN DEXTROSE 0.75-8.25 % IT SOLN
INTRATHECAL | Status: DC | PRN
Start: 2021-12-24 — End: 2021-12-24
  Administered 2021-12-24: 1.6 mL via INTRATHECAL

## 2021-12-24 MED ORDER — DEXAMETHASONE SODIUM PHOSPHATE 4 MG/ML IJ SOLN
INTRAMUSCULAR | Status: DC | PRN
  Administered 2021-12-24: 4 mg via INTRAVENOUS

## 2021-12-24 MED ORDER — VORTIOXETINE HBR 5 MG PO TABS
10.0000 mg | ORAL_TABLET | Freq: Every day | ORAL | Status: DC
  Administered 2021-12-25 – 2021-12-27 (×3): 10 mg via ORAL
  Filled 2021-12-24 (×4): qty 2

## 2021-12-24 MED ORDER — SIMETHICONE 80 MG PO CHEW: 80.0000 mg | CHEWABLE_TABLET | ORAL | Status: DC | PRN

## 2021-12-24 MED ORDER — PRENATAL MULTIVITAMIN CH
1.0000 | ORAL_TABLET | Freq: Every day | ORAL | Status: DC
  Administered 2021-12-24 – 2021-12-27 (×4): 1 via ORAL
  Filled 2021-12-24 (×4): qty 1

## 2021-12-24 MED ORDER — OXYTOCIN-SODIUM CHLORIDE 30-0.9 UT/500ML-% IV SOLN
INTRAVENOUS | Status: AC
  Filled 2021-12-24: qty 500

## 2021-12-24 MED ORDER — DIBUCAINE (PERIANAL) 1 % EX OINT: 1.0000 "application " | TOPICAL_OINTMENT | CUTANEOUS | Status: DC | PRN

## 2021-12-24 MED ORDER — MORPHINE SULFATE (PF) 0.5 MG/ML IJ SOLN
INTRAMUSCULAR | Status: DC | PRN
  Administered 2021-12-24: 150 ug via INTRATHECAL

## 2021-12-24 MED ORDER — FENTANYL CITRATE (PF) 100 MCG/2ML IJ SOLN
INTRAMUSCULAR | Status: DC | PRN
  Administered 2021-12-24: 85 ug via INTRAVENOUS

## 2021-12-24 MED ORDER — KETOROLAC TROMETHAMINE 30 MG/ML IJ SOLN
30.0000 mg | Freq: Once | INTRAMUSCULAR | Status: AC | PRN
  Administered 2021-12-24: 30 mg via INTRAVENOUS

## 2021-12-24 MED ORDER — HYDROMORPHONE HCL 1 MG/ML IJ SOLN: 0.2500 mg | INTRAMUSCULAR | Status: DC | PRN

## 2021-12-24 MED ORDER — DEXAMETHASONE SODIUM PHOSPHATE 4 MG/ML IJ SOLN
INTRAMUSCULAR | Status: AC
  Filled 2021-12-24: qty 1

## 2021-12-24 MED ORDER — ACETAMINOPHEN 325 MG PO TABS
650.0000 mg | ORAL_TABLET | ORAL | Status: DC | PRN
  Administered 2021-12-24 – 2021-12-26 (×6): 650 mg via ORAL
  Filled 2021-12-24 (×7): qty 2

## 2021-12-24 MED ORDER — DIPHENHYDRAMINE HCL 25 MG PO CAPS: 25.0000 mg | ORAL_CAPSULE | Freq: Four times a day (QID) | ORAL | Status: DC | PRN

## 2021-12-24 MED ORDER — SOD CITRATE-CITRIC ACID 500-334 MG/5ML PO SOLN
ORAL | Status: AC
  Filled 2021-12-24: qty 30

## 2021-12-24 MED ORDER — CEFAZOLIN SODIUM-DEXTROSE 2-4 GM/100ML-% IV SOLN
2.0000 g | INTRAVENOUS | Status: AC
  Administered 2021-12-24: 2 g via INTRAVENOUS

## 2021-12-24 MED ORDER — ONDANSETRON HCL 4 MG/2ML IJ SOLN
INTRAMUSCULAR | Status: DC | PRN
  Administered 2021-12-24: 4 mg via INTRAVENOUS

## 2021-12-24 MED ORDER — SODIUM CHLORIDE 0.9 % IR SOLN
Status: DC | PRN
  Administered 2021-12-24 (×2): 1

## 2021-12-24 MED ORDER — MEPERIDINE HCL 25 MG/ML IJ SOLN: 6.2500 mg | INTRAMUSCULAR | Status: DC | PRN

## 2021-12-24 MED ORDER — SENNOSIDES-DOCUSATE SODIUM 8.6-50 MG PO TABS
2.0000 | ORAL_TABLET | Freq: Every day | ORAL | Status: DC
  Administered 2021-12-25 – 2021-12-27 (×3): 2 via ORAL
  Filled 2021-12-24 (×3): qty 2

## 2021-12-24 MED ORDER — SOD CITRATE-CITRIC ACID 500-334 MG/5ML PO SOLN
30.0000 mL | Freq: Once | ORAL | Status: AC
  Administered 2021-12-24: 30 mL via ORAL

## 2021-12-24 MED ORDER — ACETAMINOPHEN 10 MG/ML IV SOLN
INTRAVENOUS | Status: AC
  Filled 2021-12-24: qty 100

## 2021-12-24 MED ORDER — KETOROLAC TROMETHAMINE 30 MG/ML IJ SOLN
30.0000 mg | Freq: Four times a day (QID) | INTRAMUSCULAR | Status: AC
  Administered 2021-12-24 – 2021-12-25 (×4): 30 mg via INTRAVENOUS
  Filled 2021-12-24 (×4): qty 1

## 2021-12-24 MED ORDER — OXYCODONE HCL 5 MG PO TABS: 5.0000 mg | ORAL_TABLET | Freq: Once | ORAL | Status: DC | PRN

## 2021-12-24 MED ORDER — ENOXAPARIN SODIUM 40 MG/0.4ML IJ SOSY
40.0000 mg | PREFILLED_SYRINGE | INTRAMUSCULAR | Status: DC
  Administered 2021-12-25 – 2021-12-27 (×3): 40 mg via SUBCUTANEOUS
  Filled 2021-12-24 (×3): qty 0.4

## 2021-12-24 MED ORDER — MORPHINE SULFATE (PF) 0.5 MG/ML IJ SOLN
INTRAMUSCULAR | Status: AC
  Filled 2021-12-24: qty 10

## 2021-12-24 MED ORDER — KETOROLAC TROMETHAMINE 30 MG/ML IJ SOLN
INTRAMUSCULAR | Status: AC
  Filled 2021-12-24: qty 1

## 2021-12-24 MED ORDER — MEDROXYPROGESTERONE ACETATE 150 MG/ML IM SUSP: 150.0000 mg | INTRAMUSCULAR | Status: DC | PRN

## 2021-12-24 MED ORDER — DESVENLAFAXINE SUCCINATE ER 25 MG PO TB24
25.0000 mg | ORAL_TABLET | Freq: Every day | ORAL | Status: DC
  Administered 2021-12-26 – 2021-12-27 (×2): 25 mg via ORAL
  Filled 2021-12-24 (×4): qty 1

## 2021-12-24 MED ORDER — DEXMEDETOMIDINE (PRECEDEX) IN NS 20 MCG/5ML (4 MCG/ML) IV SYRINGE
PREFILLED_SYRINGE | INTRAVENOUS | Status: DC | PRN
  Administered 2021-12-24: 8 ug via INTRAVENOUS

## 2021-12-24 MED ORDER — FENTANYL CITRATE (PF) 100 MCG/2ML IJ SOLN
INTRAMUSCULAR | Status: AC
  Filled 2021-12-24: qty 2

## 2021-12-24 MED ORDER — BUPRENORPHINE HCL 2 MG SL SUBL
4.0000 mg | SUBLINGUAL_TABLET | Freq: Four times a day (QID) | SUBLINGUAL | Status: DC
  Administered 2021-12-24 – 2021-12-27 (×13): 4 mg via SUBLINGUAL
  Filled 2021-12-24: qty 0.5
  Filled 2021-12-24: qty 2
  Filled 2021-12-24: qty 0.5
  Filled 2021-12-24 (×2): qty 2
  Filled 2021-12-24 (×2): qty 1
  Filled 2021-12-24 (×5): qty 2
  Filled 2021-12-24: qty 0.5
  Filled 2021-12-24 (×3): qty 2

## 2021-12-24 MED ORDER — ACETAMINOPHEN 10 MG/ML IV SOLN
INTRAVENOUS | Status: DC | PRN
  Administered 2021-12-24: 1000 mg via INTRAVENOUS

## 2021-12-24 MED ORDER — COCONUT OIL OIL: 1.0000 "application " | TOPICAL_OIL | Status: DC | PRN

## 2021-12-24 MED ORDER — LACTATED RINGERS IV SOLN: INTRAVENOUS | Status: DC

## 2021-12-24 MED ORDER — SCOPOLAMINE 1 MG/3DAYS TD PT72
MEDICATED_PATCH | TRANSDERMAL | Status: AC
  Filled 2021-12-24: qty 1

## 2021-12-24 MED ORDER — STERILE WATER FOR IRRIGATION IR SOLN
Status: DC | PRN
Start: 2021-12-24 — End: 2021-12-24
  Administered 2021-12-24: 1

## 2021-12-24 MED ORDER — TRANEXAMIC ACID-NACL 1000-0.7 MG/100ML-% IV SOLN
INTRAVENOUS | Status: DC | PRN
  Administered 2021-12-24: 1000 mg via INTRAVENOUS

## 2021-12-24 MED ORDER — FENTANYL CITRATE (PF) 100 MCG/2ML IJ SOLN
INTRAMUSCULAR | Status: DC | PRN
  Administered 2021-12-24: 15 ug via INTRATHECAL

## 2021-12-24 MED ORDER — MENTHOL 3 MG MT LOZG: 1.0000 | LOZENGE | OROMUCOSAL | Status: DC | PRN

## 2021-12-24 MED ORDER — PHENYLEPHRINE HCL-NACL 20-0.9 MG/250ML-% IV SOLN
INTRAVENOUS | Status: DC | PRN
  Administered 2021-12-24: 60 ug/min via INTRAVENOUS

## 2021-12-24 MED ORDER — SCOPOLAMINE 1 MG/3DAYS TD PT72
1.0000 | MEDICATED_PATCH | TRANSDERMAL | Status: DC
Start: 2021-12-24 — End: 2021-12-24
  Administered 2021-12-24: 1.5 mg via TRANSDERMAL

## 2021-12-24 MED ORDER — DEXMEDETOMIDINE (PRECEDEX) IN NS 20 MCG/5ML (4 MCG/ML) IV SYRINGE
PREFILLED_SYRINGE | INTRAVENOUS | Status: AC
Start: 2021-12-24 — End: ?
  Filled 2021-12-24: qty 5

## 2021-12-24 MED ORDER — GABAPENTIN 300 MG PO CAPS
600.0000 mg | ORAL_CAPSULE | Freq: Three times a day (TID) | ORAL | Status: DC
  Administered 2021-12-24 – 2021-12-27 (×10): 600 mg via ORAL
  Filled 2021-12-24 (×10): qty 2

## 2021-12-24 MED ORDER — CEFAZOLIN SODIUM-DEXTROSE 2-4 GM/100ML-% IV SOLN
INTRAVENOUS | Status: AC
  Filled 2021-12-24: qty 100

## 2021-12-24 MED ORDER — OXYCODONE HCL 5 MG/5ML PO SOLN: 5.0000 mg | Freq: Once | ORAL | Status: DC | PRN

## 2021-12-24 MED ORDER — SIMETHICONE 80 MG PO CHEW
80.0000 mg | CHEWABLE_TABLET | Freq: Three times a day (TID) | ORAL | Status: DC
  Administered 2021-12-24 – 2021-12-27 (×11): 80 mg via ORAL
  Filled 2021-12-24 (×11): qty 1

## 2021-12-24 MED ORDER — TETANUS-DIPHTH-ACELL PERTUSSIS 5-2.5-18.5 LF-MCG/0.5 IM SUSY
0.5000 mL | PREFILLED_SYRINGE | Freq: Once | INTRAMUSCULAR | Status: AC
  Administered 2021-12-27: 0.5 mL via INTRAMUSCULAR
  Filled 2021-12-24: qty 0.5

## 2021-12-24 MED ORDER — PROMETHAZINE HCL 25 MG/ML IJ SOLN: 6.2500 mg | INTRAMUSCULAR | Status: DC | PRN

## 2021-12-24 MED ORDER — CHLOROPROCAINE HCL (PF) 3 % IJ SOLN
INTRAMUSCULAR | Status: AC
Start: 2021-12-24 — End: ?
  Filled 2021-12-24: qty 20

## 2021-12-24 MED ORDER — OXYCODONE HCL 5 MG PO TABS
5.0000 mg | ORAL_TABLET | Freq: Four times a day (QID) | ORAL | Status: DC | PRN
  Administered 2021-12-26 – 2021-12-27 (×2): 5 mg via ORAL
  Administered 2021-12-27: 10 mg via ORAL
  Filled 2021-12-24 (×2): qty 1
  Filled 2021-12-24: qty 2

## 2021-12-24 MED ORDER — CHLOROPROCAINE HCL (PF) 3 % IJ SOLN
INTRAMUSCULAR | Status: DC | PRN
Start: 2021-12-24 — End: 2021-12-24
  Administered 2021-12-24: 20 mL

## 2021-12-24 MED ORDER — PHENYLEPHRINE HCL-NACL 20-0.9 MG/250ML-% IV SOLN
INTRAVENOUS | Status: AC
  Filled 2021-12-24: qty 250

## 2021-12-24 MED ORDER — OXYTOCIN-SODIUM CHLORIDE 30-0.9 UT/500ML-% IV SOLN
INTRAVENOUS | Status: DC | PRN
  Administered 2021-12-24: 300 mL via INTRAVENOUS

## 2021-12-24 MED ORDER — IBUPROFEN 600 MG PO TABS
600.0000 mg | ORAL_TABLET | Freq: Four times a day (QID) | ORAL | Status: DC
  Administered 2021-12-25 – 2021-12-27 (×9): 600 mg via ORAL
  Filled 2021-12-24 (×9): qty 1

## 2021-12-24 MED ORDER — ONDANSETRON HCL 4 MG/2ML IJ SOLN
INTRAMUSCULAR | Status: AC
  Filled 2021-12-24: qty 2

## 2021-12-24 MED ORDER — OXYTOCIN-SODIUM CHLORIDE 30-0.9 UT/500ML-% IV SOLN: 2.5000 [IU]/h | INTRAVENOUS | Status: AC

## 2021-12-24 MED ORDER — MEASLES, MUMPS & RUBELLA VAC IJ SOLR: 0.5000 mL | Freq: Once | INTRAMUSCULAR | Status: DC

## 2021-12-24 MED ORDER — WITCH HAZEL-GLYCERIN EX PADS: 1.0000 "application " | MEDICATED_PAD | CUTANEOUS | Status: DC | PRN

## 2021-12-24 MED ORDER — POVIDONE-IODINE 10 % EX SWAB
2.0000 "application " | Freq: Once | CUTANEOUS | Status: AC
  Administered 2021-12-24: 2 via TOPICAL

## 2021-12-24 SURGICAL SUPPLY — 37 items
BENZOIN TINCTURE PRP APPL 2/3 (GAUZE/BANDAGES/DRESSINGS) ×4 IMPLANT
CHLORAPREP W/TINT 26ML (MISCELLANEOUS) ×4 IMPLANT
CLAMP CORD UMBIL (MISCELLANEOUS) IMPLANT
CLOSURE WOUND 1/2 X4 (GAUZE/BANDAGES/DRESSINGS) ×1
CLOTH BEACON ORANGE TIMEOUT ST (SAFETY) ×4 IMPLANT
DERMABOND ADVANCED (GAUZE/BANDAGES/DRESSINGS) ×2
DERMABOND ADVANCED .7 DNX12 (GAUZE/BANDAGES/DRESSINGS) IMPLANT
DRSG OPSITE POSTOP 4X10 (GAUZE/BANDAGES/DRESSINGS) ×4 IMPLANT
EXTRACTOR VACUUM KIWI (MISCELLANEOUS) IMPLANT
GLOVE BIOGEL PI IND STRL 7.0 (GLOVE) ×6 IMPLANT
GLOVE BIOGEL PI INDICATOR 7.0 (GLOVE) ×6
GLOVE ECLIPSE 6.5 STRL STRAW (GLOVE) ×4 IMPLANT
GOWN STRL REUS W/TWL LRG LVL3 (GOWN DISPOSABLE) ×12 IMPLANT
HEMOSTAT ARISTA ABSORB 3G PWDR (HEMOSTASIS) ×2 IMPLANT
KIT ABG SYR 3ML LUER SLIP (SYRINGE) IMPLANT
NDL HYPO 25X5/8 SAFETYGLIDE (NEEDLE) IMPLANT
NEEDLE HYPO 25X5/8 SAFETYGLIDE (NEEDLE) IMPLANT
NS IRRIG 1000ML POUR BTL (IV SOLUTION) ×4 IMPLANT
PACK C SECTION WH (CUSTOM PROCEDURE TRAY) ×4 IMPLANT
PAD ABD 7.5X8 STRL (GAUZE/BANDAGES/DRESSINGS) ×4 IMPLANT
PAD OB MATERNITY 4.3X12.25 (PERSONAL CARE ITEMS) ×4 IMPLANT
PENCIL SMOKE EVAC W/HOLSTER (ELECTROSURGICAL) ×4 IMPLANT
PENCIL SMOKE EVACUATOR (MISCELLANEOUS) ×2 IMPLANT
RTRCTR C-SECT PINK 25CM LRG (MISCELLANEOUS) ×4 IMPLANT
STRIP CLOSURE SKIN 1/2X4 (GAUZE/BANDAGES/DRESSINGS) ×3 IMPLANT
SUT PLAIN 0 NONE (SUTURE) IMPLANT
SUT PLAIN 2 0 XLH (SUTURE) IMPLANT
SUT VIC AB 0 CT1 27 (SUTURE) ×4
SUT VIC AB 0 CT1 27XBRD ANBCTR (SUTURE) ×4 IMPLANT
SUT VIC AB 0 CTX 36 (SUTURE) ×6
SUT VIC AB 0 CTX36XBRD ANBCTRL (SUTURE) ×6 IMPLANT
SUT VIC AB 2-0 CT1 27 (SUTURE) ×2
SUT VIC AB 2-0 CT1 TAPERPNT 27 (SUTURE) ×2 IMPLANT
SUT VIC AB 4-0 KS 27 (SUTURE) ×4 IMPLANT
TOWEL OR 17X24 6PK STRL BLUE (TOWEL DISPOSABLE) ×4 IMPLANT
TRAY FOLEY W/BAG SLVR 14FR LF (SET/KITS/TRAYS/PACK) IMPLANT
WATER STERILE IRR 1000ML POUR (IV SOLUTION) ×4 IMPLANT

## 2021-12-24 NOTE — Anesthesia Preprocedure Evaluation (Signed)
Anesthesia Evaluation  ?Patient identified by MRN, date of birth, ID band ?Patient awake ? ? ? ?Reviewed: ?Allergy & Precautions, H&P , NPO status , Patient's Chart, lab work & pertinent test results, reviewed documented beta blocker date and time  ? ?Airway ?Mallampati: II ? ?TM Distance: >3 FB ?Neck ROM: full ? ? ? Dental ?no notable dental hx. ? ?  ?Pulmonary ?neg pulmonary ROS, asthma , former smoker,  ?  ?Pulmonary exam normal ?breath sounds clear to auscultation ? ? ? ? ? ? Cardiovascular ?Exercise Tolerance: Good ?negative cardio ROS ? ? ?Rhythm:regular Rate:Normal ? ? ?  ?Neuro/Psych ?PSYCHIATRIC DISORDERS Anxiety Depression Bipolar Disorder negative neurological ROS ? negative psych ROS  ? GI/Hepatic ?negative GI ROS, Neg liver ROS,   ?Endo/Other  ?diabetes, Gestational ? Renal/GU ?negative Renal ROS  ?negative genitourinary ?  ?Musculoskeletal ? ? Abdominal ?(+) + obese,   ?Peds ? Hematology ?negative hematology ROS ?(+)   ?Anesthesia Other Findings ? ? Reproductive/Obstetrics ?negative OB ROS ?(+) Pregnancy ? ?  ? ? ? ? ? ? ? ? ? ? ? ? ? ?  ?  ? ? ? ? ? ? ? ? ?Anesthesia Physical ? ?Anesthesia Plan ? ?ASA: 3 ? ?Anesthesia Plan: Spinal  ? ?Post-op Pain Management:   ? ?Induction:  ? ?PONV Risk Score and Plan: Treatment may vary due to age or medical condition ? ?Airway Management Planned: Natural Airway ? ?Additional Equipment:  ? ?Intra-op Plan:  ? ?Post-operative Plan:  ? ?Informed Consent: I have reviewed the patients History and Physical, chart, labs and discussed the procedure including the risks, benefits and alternatives for the proposed anesthesia with the patient or authorized representative who has indicated his/her understanding and acceptance.  ? ? ? ?Dental Advisory Given ? ?Plan Discussed with: CRNA ? ?Anesthesia Plan Comments:   ? ? ? ? ? ? ?Anesthesia Quick Evaluation ? ?

## 2021-12-24 NOTE — Lactation Note (Addendum)
This note was copied from a baby's chart. ?Lactation Consultation Note ? ?Patient Name: Bonnie Weaver ?Today's Date: 12/24/2021 ?Reason for consult: Initial assessment;Term;Maternal endocrine disorder;Other (Comment) (Mom on suboxone) ?Age:36 hours ? ?Maternal Data ?Has patient been taught Hand Expression?: No ?Does the patient have breastfeeding experience prior to this delivery?: No (Mom pumped & dumped for 2 weeks after having been in the ICU and on a ventilator) ? ?Interventions ?Interventions: DEBP;Education ? ?Discharge ?Pump: Personal (Mom has a Medela pump at home) ? ?Infant is at risk for NAS b/c of suboxone, which may be potentiated by the desvenlaxafine. From [employer]: "With the related drug venlafaxine, newborn infants of mothers who took the drug during pregnancy sometimes experienced poor neonatal adaptation as seen with other antidepressants such as SSRIs or SNRIs. Similar effects may occur with desvenlafaxine." Mom is also on vortioxetine 10 mg qd (L3). ? ? ?Lurline Hare Ellerbe ?12/24/2021, 2:18 PM ? ? ? ?

## 2021-12-24 NOTE — Anesthesia Postprocedure Evaluation (Signed)
Anesthesia Post Note ? ?Patient: Bonnie Weaver ? ?Procedure(s) Performed: CESAREAN SECTION ?BILATERAL TUBAL LIGATION ? ?  ? ?Patient location during evaluation: PACU ?Anesthesia Type: Spinal ?Level of consciousness: awake and alert ?Pain management: pain level controlled ?Vital Signs Assessment: post-procedure vital signs reviewed and stable ?Respiratory status: spontaneous breathing, nonlabored ventilation and respiratory function stable ?Cardiovascular status: blood pressure returned to baseline and stable ?Postop Assessment: no apparent nausea or vomiting ?Anesthetic complications: no ? ? ?No notable events documented. ? ?Last Vitals:  ?Vitals:  ? 12/24/21 1215 12/24/21 1229  ?BP: 112/72 114/70  ?Pulse: 84 77  ?Resp: 16 20  ?Temp: 36.8 ?C   ?SpO2: 96%   ?  ?Last Pain:  ?Vitals:  ? 12/24/21 1215  ?TempSrc: Axillary  ?PainSc: 1   ? ?Pain Goal: Patients Stated Pain Goal: 5 (12/24/21 1116) ? ?  ?  ?  ?  ?  ?  ?Epidural/Spinal Function Cutaneous sensation: Tingles (12/24/21 1229), Patient able to flex knees: Yes (12/24/21 1229), Patient able to lift hips off bed: Yes (12/24/21 1229), Back pain beyond tenderness at insertion site: No (12/24/21 1229), Progressively worsening motor and/or sensory loss: No (12/24/21 1229), Bowel and/or bladder incontinence post epidural: No (12/24/21 1229) ? ?Lynda Rainwater ? ? ? ? ?

## 2021-12-24 NOTE — Op Note (Addendum)
Operative Note  ? ?Patient: Bonnie Weaver ? ?Date of Procedure: 12/24/2021 ? ?Procedure: Repeat Low Transverse Cesarean and Bilateral Tubal Ligation via modified pomeroy   ? ?Indications:  scheduled repeat cesarean section ? ?Pre-operative Diagnosis: RCS. A2GDM, substance use disorder on suboxone ? ?Post-operative Diagnosis: Same ? ?TOLAC Candidate: No ? ?Surgeon: Moishe Weaver) and Role: ?   Bonnie Hidalgo, DO - Primary ?   Warner Mccreedy, MD - Assisting ? ? ?An experienced assistant was required given the standard of surgical care given the complexity of the case.  This assistant was needed for exposure, dissection, suctioning, retraction, instrument exchange, assisting with delivery with administration of fundal pressure, and for overall help during the procedure.  ? ?Anesthesia: spinal ? ?Anesthesiologist: Lowella Curb, MD  ? ?Antibiotics: Cefazolin ?  ?Estimated Blood Loss: 893 ml  ? ?Total IV Fluids: 1300 ml ? ?Urine Output:  280 cc OF clear urine ? ?Specimens: segment of right and left fallopian tubes  ? ?Complications:  none   ? ?Indications: ?Bonnie Weaver is a 36 y.o. [phone] with an IUP [redacted]w[redacted]d presenting for scheduled cesarean secondary to the indications listed above.  ? ? ?Findings: Viable infant in cephalic presentation, no nuchal cord present. Double body cord present around lower abdomen/hip area. Apgars 9 , 9 , . Weight 3230 g . Clear amniotic fluid. Normal placenta, three vessel cord. Normal uterus, bilateral fallopian tubes with significant adhesions from fundus to distal edge around fimbriae, Normal bilateral ovaries. ? ?Procedure Details: A Time Out was held and the above information confirmed. The patient received intravenous antibiotics and had sequential compression devices applied to her lower extremities preoperatively. The patient was taken back to the operative suite where spinal anesthesia was administered. After induction of anesthesia, the patient was draped and prepped in the  usual sterile manner and placed in a dorsal supine position with a leftward tilt. A low transverse skin incision was made with scalpel that was 2 finger breaths above pubic bone and carried down through the subcutaneous tissue to the fascia. Fascial incision was made and extended transversely. The fascia was separated from the underlying rectus tissue superiorly and inferiorly. The rectus muscles were separated in the midline bluntly and the peritoneum was entered bluntly. An Alexis retractor was placed to aid in visualization of the uterus. The utero-vesical peritoneal reflection was incised transversely and the bladder flap was bluntly freed from the lower uterine segment. A low transverse uterine incision was made. The infant was successfully delivered from cephalic presentation, the umbilical cord was clamped after 1 minute. Cord ph was not sent, and cord blood was obtained for evaluation. The placenta was removed Intact and appeared normal. At time of placenta delivery brisk bleeding noted from hysterotomy and TXA administered to patient. The uterine incision was closed with running locked sutures of 0-Vicryl, and then a second imbricating layer was also placed with 0-Vicryl. At that time mostly hemostatic and therefore packed with a sponge and attention was then turned to the fallopian tubes. ? ?The left Fallopian tube was palpated and then traced to it's fimbriae.  The fallopian tube was found to be significantly adhered to uterine tissue starting at the fundus down to fimbriae. Due to adhesions with visible blood vessels and increased risk of bleeding a modified pomeroy method of tubal ligation was utilized. ? ?An avascular midsection of the tube approximately 3-4cm from the cornua was grasped with the Babcock clamps and brought into a knuckle. Bovie utilized to make small  space for tube. The tube was double ligated with 3-0 plain gut suture and the intervening portion of tube was transected and removed.  Excellent hemostasis was noted.  Attention was then turned to the right fallopian tube, after confirmation of identification by tracing the tube out to the fimbriae. The right tube was found to be similarly significantly adhered to uterine tissue starting at the fundus down to fimbriae. The same procedure as listed above was then performed on the right Fallopian tube with excellent hemostasis noted after cautery with bovie on remaining edges of tube. ? ?The abdomen and the pelvis were cleared of all clot and debris and the Jon Gills was removed. Hemostasis was confirmed on all surfaces.  The peritoneum was reapproximated using 2-0 vicryl . The fascia was then closed using 0 Vicryl in a running fashion. The subcutaneous layer was reapproximated with plain gut and the skin was closed with a 4-0 vicryl subcuticular stitch. The patient tolerated the procedure well. Sponge, lap, instrument and needle counts were correct x 2. She was taken to the recovery room in stable condition. ? ?Disposition: PACU - hemodynamically stable.  ? ? ?Signed: ?Warner Mccreedy, MD, MPH ?[facility]) ? ?

## 2021-12-24 NOTE — Discharge Summary (Shared)
Postpartum Discharge Summary  Date of Service updated***     Patient Name: Bonnie Weaver DOB: [birth year] MRN: [id number]  Date of admission: 12/24/2021 Delivery date:12/24/2021  Delivering provider: Janyth Pupa  Date of discharge: 12/24/2021  Admitting diagnosis: Intrauterine pregnancy [Z34.90] Intrauterine pregnancy: [redacted]w[redacted]d    Secondary diagnosis:  Principal Problem:   Intrauterine pregnancy Active Problems:   History of asthma   Genital herpes simplex   Supervision of high-risk pregnancy   History of cesarean delivery   History of pre-eclampsia in prior pregnancy, currently pregnant   Suboxone maintenance treatment complicating pregnancy, antepartum ([place]   Bipolar, Dep/anx, ADHD   Smoker   Gestational diabetes  Additional problems: ***None    Discharge diagnosis: Term Pregnancy Delivered and GDM A2                                              Post partum procedures: tubal ligation Augmentation: N/A Complications: None  Hospital course: Sceduled C/S   36y.o. yo G3P1011 at 354w0das admitted to the hospital 12/24/2021 for scheduled cesarean section with the following indication:Elective Repeat.Delivery details are as follows:  Membrane Rupture Time/Date: 10:14 AM ,12/24/2021   Delivery Method:C-Section, Low Transverse  Details of operation can be found in separate operative note.  Patient had an uncomplicated postpartum course.  She is ambulating, tolerating a regular diet, passing flatus, and urinating well. Patient is discharged home in stable condition on  12/24/21        Newborn Data: Birth date:12/24/2021  Birth time:10:15 AM  Gender:Female  Living status:Living  Apgars:9 ,9  Weight:3230 g     Magnesium Sulfate received: No BMZ received: No Rhophylac:N/A MMR:N/A T-DaP:{Tdap:23962} Flu: {F{OBS:96283}ransfusion:{Transfusion received:30440034}  Physical exam  Vitals:   12/24/21 0814 12/24/21 0825  BP:  126/87  Pulse:  (!) 107  Resp:  16  Temp:   98.2 F (36.8 C)  TempSrc:  Oral  Weight: 88 kg   Height:  5' 2"  (1.575 m)   General: {Exam; general:21111117} Lochia: {Desc; appropriate/inappropriate:30686::"appropriate"} Uterine Fundus: {Desc; firm/soft:30687} Incision: {Exam; incision:21111123} DVT Evaluation: {Exam; dvt:2111122} Labs: Lab Results  Component Value Date   WBC 12.7 (H) 12/22/2021   HGB 11.0 (L) 12/22/2021   HCT 32.8 (L) 12/22/2021   MCV 89.9 12/22/2021   PLT 315 12/22/2021   CMP Latest Ref Rng & Units 12/24/2021  Glucose 70 - 99 mg/dL 90  BUN 6 - 20 mg/dL 8  Creatinine 0.44 - 1.00 mg/dL 0.60  Sodium 135 - 145 mmol/L 135  Potassium 3.5 - 5.1 mmol/L 3.8  Chloride 98 - 111 mmol/L 105  CO2 22 - 32 mmol/L 20(L)  Calcium 8.9 - 10.3 mg/dL 8.3(L)  Total Protein 6.5 - 8.1 g/dL 6.1(L)  Total Bilirubin 0.3 - 1.2 mg/dL 0.5  Alkaline Phos 38 - 126 U/L 220(H)  AST 15 - 41 U/L 18  ALT 0 - 44 U/L 14   Edinburgh Score: No flowsheet data found.    After visit meds:  Allergies as of 12/24/2021       Reactions   Sulfa Antibiotics Anaphylaxis, Hives   Codeine Rash     [facility] must [facility] completed [facility]        Discharge home in stable condition Infant Feeding: Bottle and Breast Infant Disposition:home with mother Discharge instruction: per After Visit Summary and  Postpartum booklet. Activity: Advance as tolerated. Pelvic rest for 6 weeks.  Diet: routine diet Anticipated Birth Control: BTL done PP Postpartum Appointment:  Additional Postpartum F/U: 2 hour GTT, Incision check 1 week, and BP check 1 week Future Appointments: Future Appointments  Date Time Provider [place]  01/04/2022 10:10 AM Janyth Pupa, DO [facility]   Follow up Visit: Message sent to [facility] by Dr. Cy Blamer on 3/12     12/24/2021 [name], MD

## 2021-12-24 NOTE — Anesthesia Procedure Notes (Signed)
Spinal ? ?Patient location during procedure: OB ?Start time: 12/24/2021 9:47 AM ?End time: 12/24/2021 9:52 AM ?Reason for block: surgical anesthesia ?Staffing ?Performed: other anesthesia staff  ?Anesthesiologist: Lowella Curb, MD ?Preanesthetic Checklist ?Completed: patient identified, IV checked, risks and benefits discussed, surgical consent, monitors and equipment checked, pre-op evaluation and timeout performed ?Spinal Block ?Patient position: sitting ?Prep: DuraPrep and site prepped and draped ?Patient monitoring: heart rate, cardiac monitor, continuous pulse ox and blood pressure ?Approach: midline ?Location: L3-4 ?Injection technique: single-shot ?Needle ?Needle type: Pencan  ?Needle gauge: 24 G ?Needle length: 10 cm ?Assessment ?Sensory level: T4 ?Events: CSF return ?Additional Notes ?SAB placed by SRNA under direct supervision ? ? ? ?

## 2021-12-24 NOTE — Transfer of Care (Signed)
Immediate Anesthesia Transfer of Care Note ? ?Patient: Bonnie Weaver ? ?Procedure(s) Performed: CESAREAN SECTION ?BILATERAL TUBAL LIGATION ? ?Patient Location: PACU ? ?Anesthesia Type:Spinal ? ?Level of Consciousness: awake ? ?Airway & Oxygen Therapy: Patient Spontanous Breathing ? ?Post-op Assessment: Report given to RN ? ?Post vital signs: Reviewed and stable ? ?Last Vitals:  ?Vitals Value Taken Time  ?BP 126/78 12/24/21 1116  ?Temp    ?Pulse 92 12/24/21 1117  ?Resp 14 12/24/21 1117  ?SpO2 97 % 12/24/21 1117  ?Vitals shown include unvalidated device data. ? ?Last Pain:  ?Vitals:  ? 12/24/21 0825  ?TempSrc: Oral  ?PainSc: 0-No pain  ?   ? ?  ? ?Complications: No notable events documented. ?

## 2021-12-25 ENCOUNTER — Encounter (HOSPITAL_COMMUNITY): Payer: Self-pay | Admitting: Obstetrics & Gynecology

## 2021-12-25 ENCOUNTER — Other Ambulatory Visit: Payer: Medicaid Other

## 2021-12-25 LAB — CBC
HCT: 27.1 % — ABNORMAL LOW (ref 36.0–46.0)
Hemoglobin: 9.3 g/dL — ABNORMAL LOW (ref 12.0–15.0)
MCH: 30.8 pg (ref 26.0–34.0)
MCHC: 34.3 g/dL (ref 30.0–36.0)
MCV: 89.7 fL (ref 80.0–100.0)
Platelets: 276 10*3/uL (ref 150–400)
RBC: 3.02 MIL/uL — ABNORMAL LOW (ref 3.87–5.11)
RDW: 14.1 % (ref 11.5–15.5)
WBC: 14.2 10*3/uL — ABNORMAL HIGH (ref 4.0–10.5)
nRBC: 0 % (ref 0.0–0.2)

## 2021-12-25 LAB — GLUCOSE, CAPILLARY: Glucose-Capillary: 149 mg/dL — ABNORMAL HIGH (ref 70–99)

## 2021-12-25 MED ORDER — TRANEXAMIC ACID-NACL 1000-0.7 MG/100ML-% IV SOLN
INTRAVENOUS | Status: AC
  Filled 2021-12-25: qty 100

## 2021-12-25 MED ORDER — DEXTROMETHORPHAN POLISTIREX ER 30 MG/5ML PO SUER
15.0000 mg | ORAL | Status: DC | PRN
  Filled 2021-12-25: qty 5

## 2021-12-25 MED ORDER — FLUTICASONE PROPIONATE 50 MCG/ACT NA SUSP
2.0000 | Freq: Every day | NASAL | Status: DC
  Administered 2021-12-25 – 2021-12-27 (×3): 2 via NASAL
  Filled 2021-12-25: qty 16

## 2021-12-25 MED ORDER — ACETAMINOPHEN 10 MG/ML IV SOLN
INTRAVENOUS | Status: AC
  Filled 2021-12-25: qty 100

## 2021-12-25 MED ORDER — FERROUS SULFATE 325 (65 FE) MG PO TABS
325.0000 mg | ORAL_TABLET | ORAL | Status: DC
  Administered 2021-12-25 – 2021-12-27 (×2): 325 mg via ORAL
  Filled 2021-12-25 (×2): qty 1

## 2021-12-25 MED ORDER — SODIUM CHLORIDE 0.9 % IV SOLN
500.0000 mg | Freq: Once | INTRAVENOUS | Status: AC
  Administered 2021-12-25: 500 mg via INTRAVENOUS
  Filled 2021-12-25: qty 25

## 2021-12-25 MED ORDER — OXYTOCIN-SODIUM CHLORIDE 30-0.9 UT/500ML-% IV SOLN
INTRAVENOUS | Status: AC
  Filled 2021-12-25: qty 500

## 2021-12-25 MED ORDER — ALBUTEROL SULFATE (2.5 MG/3ML) 0.083% IN NEBU: 2.5000 mg | INHALATION_SOLUTION | RESPIRATORY_TRACT | Status: DC | PRN

## 2021-12-25 NOTE — Clinical Social Work Maternal (Signed)
?CLINICAL SOCIAL WORK MATERNAL/CHILD NOTE ? ?Patient Details  ?Name: Bonnie Weaver ?MRN: [id number] ?Date of Birth: 27-May-1986 ? ?Date:  12/25/2021 ? ?Clinical Social Worker Initiating Note:  Darra Lis, LCSW Date/Time: Initiated:  12/25/21/1015    ? ?Child's Name:  Bonnie Weaver  ? ?Biological Parents:  Mother  ? ?Need for Interpreter:  None  ? ?Reason for Referral:  Current Substance Use/Substance Use During Pregnancy  , Behavioral Health Concerns  ? ?Address:  [address] ?[place] [address]  ?  ?Phone number:  [phone] (home)    ? ?Additional phone number:  ? ?Household Members/Support Persons (HM/SP):   Household Member/Support Person 1, Household Member/Support Person 2, Household Member/Support Person 3 ? ? ?HM/SP Name Relationship DOB or Age  ?HM/SP -1 Bonnie Weaver Father 06/23/1958  ?HM/SP -2 Bonnie Weaver Sister 27  ?HM/SP -3 Bonnie Weaver Daughter 06/13/2007  ?HM/SP -4        ?HM/SP -5        ?HM/SP -6        ?HM/SP -7        ?HM/SP -8        ? ? ?Natural Supports (not living in the home):  Immediate Family  ? ?Professional Supports: Therapist  ? ?Employment: Unemployed  ? ?Type of Work:    ? ?Education:  Vocation/technical training  ? ?Homebound arranged:   ? ?Financial Resources:  Medicaid  ? ?Other Resources:  Physicist, medical  , [place]  ? ?Cultural/Religious Considerations Which May Impact Care:   ? ?Strengths:  Ability to meet basic needs  , Home prepared for child  , Psychotropic Medications, Compliance with medical plan    ? ?Psychotropic Medications:  Trintellix, Other meds, Subutex (Pristiq)     ? ?Pediatrician:      ? ?Pediatrician List:  ? ?[place]    ?[place]    ?[facility]    ?[facility]    ?[facility]    ?[facility]    ? ? ?Pediatrician Fax Number:   ? ?Risk Factors/Current Problems:  None  ? ?Cognitive State:  Alert  , Insightful  , Linear Thinking    ? ?Mood/Affect:  Calm  , Interested  , Comfortable    ? ?CSW Assessment: CSW consulted for  "bipolar, anx,depr, & suboxone use." CSW met with MOB to assess and provide support. CSW introduced self and role. CSW observed MOB holding infant. CSW informed MOB of the reason for consult. MOB was understanding. MOB reported she currently lives with her father, daughter and sister. MOB identified FOB as 'Gerald Stabs' and stated that although she has attempted to contact him, he does not know about infant. MOB receives both [facility] and food stamp benefits. CSW inquired on MOB current mood. MOB stated she is currently doing okay. MOB disclosed she had a tough pregnancy due to multiple medical challenges. CSW inquired on MOB mental health history. MOB identified a diagnosis of anxiety, depression and bipolar I. MOB stated she was diagnosed with anxiety/depression at 16 and bipolar at 44. MOB shared that she experienced some anxiety and depression during the pregnancy, which she managed by counseling. MOB reported that in addition to managing her Subutex, ' [facility]' also provides counseling every two weeks. MOB identified the counseling as being helpful, stating she is given worksheet and activities to complete. Additionally, MOB stated she is taking Pristiq 19m and Trintellix 137m(previously Abilify), [facility] as helpful in managing  symptoms. MOB reported she stopped Lamictal during pregnancy, however she plans to restart postpartum. MOB stated she is unsure if she will restart Adderall.  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 MOB stated she started treatment due to opioid abuse and that she has been sober for 8 years now. CSW congratulated MOB. MOB stated she takes Subutex every 6 hours. CSW informed MOB of the hospital drug screen policy. MOB aware infant UDS is negative and the CDS will be followed. CSW informed MOB that a CPS report will be made if infant test positive for a substance  not prescribed. MOB expressed understanding and denies any previous CPS history.  ?MOB identified her father and sister as primary supports postpartum. MOB denies any current SI, HI or being involved in DV.  ? ?CSW provided education regarding the baby blues period versus PPD and provided resources. MOB disclosed that she experienced PPD for about 6 months following the birth of her daughter. MOB stated she was depressed and had no appetite. MOB reported she started seeing a counselor and had her medications adjusted to cope. ?CSW provided the New Mom Checklist and encouraged MOB to self evaluate and contact a medical professional if symptoms are noted at any time.  ?CSW provided review of Sudden Infant Death Syndrome (SIDS) precautions.  MOB stated she has a bassinet and car seat. MOB is still identifying a pediatrician, but reports no concerns with transportation. MOB reported no additional stressors or needs at this time. ? ?CSW will follow CDS and make a CPS report if warranted. CSW identifies no further need for intervention and no barriers to discharge at this time. ? ?CSW Plan/Description:  CSW Will Continue to Monitor Umbilical Cord Tissue Drug Screen Results and Make Report if Warranted, Child Protective Service Report  , Hospital Drug Screen Policy Information, Perinatal Mood and Anxiety Disorder (PMADs) Education, No Further Intervention Required/No Barriers to Discharge, Sudden Infant Death Syndrome (SIDS) Education, Other Information/Referral to [employer], Other Patient/Family Education  ? ? ?Waylan Boga, LCSWA ?12/25/2021, 11:31 AM ? ?

## 2021-12-25 NOTE — Progress Notes (Addendum)
Inpatient Diabetes Program Recommendations ? ?AACE/ADA: New Consensus Statement on Inpatient Glycemic Control (2015) ? ?Target Ranges:  Prepandial:   less than 140 mg/dL ?     Peak postprandial:   less than 180 mg/dL (1-2 hours) ?     Critically ill patients:  140 - 180 mg/dL  ? ?Lab Results  ?Component Value Date  ? GLUCAP 149 (H) 12/25/2021  ? ? ?Review of Glycemic Control ? ? ?Inpatient Diabetes Program Recommendations:   ? ?Please change CBG's AC/HS or just fasting as she has delivered.   ? ?Will continue to follow while inpatient. ? ?Thank you, ?Reche Dixon, MSN, RN ?Diabetes Coordinator ?Inpatient Diabetes Program ?[phone] (team pager from 8a-5p) ? ? ? ?

## 2021-12-25 NOTE — Lactation Note (Signed)
This note was copied from a baby's chart. ?Lactation Consultation Note ? ?Patient Name: Bonnie Weaver ?Today's Date: 12/25/2021 ?Reason for consult: Initial assessment;Term;Difficult latch;RN request ?Age:36 hours ? ?Mom pumping on one side when LC entered room.  Mom states her sister turned the pump on and encouraged her to pump while baby was being bathed. ? ?Per mom she had stage 0 BR CA and biospy for removal. ?Hand expression shown to mom.  Attempted to finger feed back to infant.  Infant had clamped jaws and gums.  LC placed colostrum on lips.  Infant began sucking finger but only gums were felt with minimal tongue movement.   ?Baby was placed STS and cued to latch after being fed EBM.  Latched with LC assistance in laid back position. ? ?No swallows heard with sucking motions noted.  Mom denied pain but states she only felt a tug of the tongue once.   ? ?LC recommended supplementing with a bottle after BF to help with sucking and provide calories.  Mom will continue to pump and feed back any milk expressed to her baby. ? ?Maternal Data ?Has patient been taught Hand Expression?: Yes ?Does the patient have breastfeeding experience prior to this delivery?: No ? ?Feeding ?Mother's Current Feeding Choice: Breast Milk and Formula ? ?LATCH Score ?Latch: Repeated attempts needed to sustain latch, nipple held in mouth throughout feeding, stimulation needed to elicit sucking reflex. ? ?Audible Swallowing: None ? ?Type of Nipple: Everted at rest and after stimulation ? ?Comfort (Breast/Nipple): Filling, red/small blisters or bruises, mild/mod discomfort ? ?Hold (Positioning): No assistance needed to correctly position infant at breast. ? ?LATCH Score: 6 ? ? ?Lactation Tools Discussed/Used ?Flange Size: 24 ?Breast pump type: Double-Electric Breast Pump ?Pump Education: Setup, frequency, and cleaning ?Reason for Pumping: stimulate milk supply/ difficult latch ?Pumping frequency: encouraged every 3 hours or after when  baby feeds ?Pumped volume: 3 mL ? ?Interventions ?Interventions: Breast feeding basics reviewed;Assisted with latch;Skin to skin;Adjust position;Support pillows;Position options;Education;LC Services brochure;Expressed milk ? ?Discharge ?Pump: DEBP ? ?Consult Status ?Consult Status: Follow-up ?Date: 12/26/21 ?Follow-up type: In-patient ? ? ? ?Bonnie Weaver ?12/25/2021, 12:02 AM ? ? ? ?

## 2021-12-25 NOTE — Progress Notes (Signed)
Post Op Day 1 ?Subjective: ?up ad lib, tolerating PO, and + flatus, Bonnie Weaver is feeling slightly light-headed and tired today. She states that she took daily oral iron as part of her prenatal care and would be open to oral iron supplementation. States her bleeding has been normal. ? ? ?Objective: ?Blood pressure 110/64, pulse 98, temperature 97.9 ?F (36.6 ?C), temperature source Oral, resp. rate 18, height 5\' 2"  (1.575 m), weight 88 kg, last menstrual period 03/26/2021, SpO2 98 %. ? ?Physical Exam:  ?General: alert and cooperative ?Lochia: appropriate ?Uterine Fundus: firm ?Incision: healing well, no significant drainage ?DVT Evaluation: No evidence of DVT seen on physical exam. ?Negative Homan's sign. ?No cords or calf tenderness. ? ?Recent Labs  ?  12/22/21 ?1106 12/25/21 ?0429  ?HGB 11.0* 9.3*  ?HCT 32.8* 27.1*  ? ? ?Assessment/Plan: ?36 yo [id number] on POD#1 after scheduled repeat cesarean section. Overall doing well. Pain is moderately controlled.  ? ?#Acute blood loss anemia ?HgB 11.0>9.3 with some symptoms. Will give IV iron today and continue PO iron at discharge ? ?#Hx of bipolar disorder ?#Hx of depression/anxiety ?On Desvenlafaxine (Pristiq) 25mg  daily. Patient will be taking from her home supply that was verified by pharmacy. Continue home dose while here. Patient needs her home supply back from pharmacy before discharge. ? ?#Hx of ADHD ?On Trintellix 10mg  daily. Continue home dose while in hospital ? ?#Opioid use disorder -stable on Subutex ?Continue home subutex dose of 8mg  every 6 hours ? ?#A2GDM ?On metformin in pregnancy. BG done today was not fasting. Will check tomorrow (on POD#2) fasting glucose. ? ?#Congestion ?#Hx of asthma ?Patient reports some congestion. No wheezing on exam. Reports has hx of asthma as uses PRN albuterol at home. ?- albuterol inhaler PRN q4hr ?- Dextromethorphan for decongestant ?- Flonase ? ? ? LOS: 1 day  ? ?Alwyn Ren ?12/25/2021, 7:45 AM  ? ?GME ATTESTATION:  ?I saw and  evaluated the patient. I agree with the findings and the plan of care as documented in the student's note and have made all necessary edits. ? ?[name], MD, MPH ?OB Fellow, Faculty Practice ?[place] [facility] ?12/25/2021 11:10 AM ? ? ? ?

## 2021-12-26 LAB — SURGICAL PATHOLOGY

## 2021-12-26 LAB — GLUCOSE, CAPILLARY: Glucose-Capillary: 101 mg/dL — ABNORMAL HIGH (ref 70–99)

## 2021-12-26 MED ORDER — ACETAMINOPHEN 500 MG PO TABS
1000.0000 mg | ORAL_TABLET | Freq: Four times a day (QID) | ORAL | Status: DC | PRN
Start: 2021-12-26 — End: 2021-12-26

## 2021-12-26 MED ORDER — FUROSEMIDE 20 MG PO TABS
20.0000 mg | ORAL_TABLET | Freq: Every day | ORAL | Status: DC
  Administered 2021-12-26 – 2021-12-27 (×2): 20 mg via ORAL
  Filled 2021-12-26 (×2): qty 1

## 2021-12-26 MED ORDER — ACETAMINOPHEN 500 MG PO TABS: 1000.0000 mg | ORAL_TABLET | ORAL | Status: DC | PRN

## 2021-12-26 MED ORDER — ACETAMINOPHEN 500 MG PO TABS
1000.0000 mg | ORAL_TABLET | Freq: Four times a day (QID) | ORAL | Status: DC | PRN
  Administered 2021-12-26 – 2021-12-27 (×5): 1000 mg via ORAL
  Filled 2021-12-26 (×5): qty 2

## 2021-12-26 NOTE — Progress Notes (Addendum)
Around 0720am this morning one of the ladies cleaning Pridgeon's room asked me to come in and look at Bordon's baby girl. While mom was sleeping on the edge of the bed the baby was sleeping at the bottom of the bed near moms feet. I moved the baby to the crib and swaddled her. The baby begin to cry and mom continued to sleep. I notified the day shift [place]. ?

## 2021-12-26 NOTE — Social Work (Signed)
CSW verbally consulted and informed MOB was observed co-sleeping.  ? ?CSW met with MOB for follow-up. CSW observed MOB daughter also present in the room. MOB provided permission for her daughter to remain in the room. CSW informed MOB that she has been observed co-sleeping. MOB apologized and stated she fell asleep. CSW reviewed Sudden Infant Death Education (SIDS) with MOB. CSW reviewed the risk of infant falling off the bed. CSW informed MOB if she is getting sleepy to place infant in bassinet or notify staff, who have offered to take infant to nursery. MOB expressed understanding.  ? ?Jahari Billy, LCSW ?Clinical Social Work ?[facility] ?[phone] ?

## 2021-12-26 NOTE — Progress Notes (Signed)
Subjective: ?Postpartum Day 2: Cesarean Delivery ?Patient reports tolerating PO, + flatus, and no problems voiding.   ? ?Objective: ?Vital signs in last 24 hours: ?Temp:  [97.6 ?F (36.4 ?C)-98.4 ?F (36.9 ?C)] 97.6 ?F (36.4 ?C) (03/14 0519) ?Pulse Rate:  [90-98] 98 (03/14 0519) ?Resp:  [16-18] 18 (03/14 0519) ?BP: (109-116)/(75-79) 109/79 (03/14 0519) ? ?Physical Exam:  ?General: alert, cooperative, and no distress ?Lochia: appropriate ?Uterine Fundus: firm ?Incision: healing well ?DVT Evaluation: No evidence of DVT seen on physical exam. ? ?Recent Labs  ?  12/25/21 ?0429  ?HGB 9.3*  ?HCT 27.1*  ? ? ?Assessment/Plan: ?Status post Cesarean section. Doing well postoperatively.  ?Plan for DC tomorrow. ?Start lasix 20mg  QD today  ? ? ? DNP, CNM  ?12/26/21  3:28 PM  ? ? ? ?

## 2021-12-27 ENCOUNTER — Telehealth: Payer: Self-pay

## 2021-12-27 MED ORDER — IBUPROFEN 600 MG PO TABS: 600.0000 mg | ORAL_TABLET | Freq: Four times a day (QID) | ORAL | 0 refills | Status: DC

## 2021-12-27 MED ORDER — GABAPENTIN 300 MG PO CAPS: 600.0000 mg | ORAL_CAPSULE | Freq: Three times a day (TID) | ORAL | 0 refills | Status: DC

## 2021-12-27 MED ORDER — FUROSEMIDE 20 MG PO TABS: 20.0000 mg | ORAL_TABLET | Freq: Every day | ORAL | 0 refills | Status: DC

## 2021-12-27 MED ORDER — OXYCODONE HCL 5 MG PO TABS: 5.0000 mg | ORAL_TABLET | Freq: Four times a day (QID) | ORAL | 0 refills | Status: AC | PRN

## 2021-12-27 NOTE — Telephone Encounter (Signed)
Pharmacy left VM stating they need verbal confirmation that provider is aware patient is taking Subutex prior to dispensing oxycodone 5 mg #12. Forwarded to correct office.  ?

## 2021-12-28 ENCOUNTER — Ambulatory Visit: Payer: Self-pay

## 2021-12-28 ENCOUNTER — Other Ambulatory Visit: Payer: Medicaid Other | Admitting: Women's Health

## 2021-12-28 NOTE — Lactation Note (Signed)
This note was copied from a baby's chart. ?Lactation Consultation Note ?Mom had company but stated BF going well and taking formula well to. Mom stated the baby is having loose stools and bottom raw. Asked if putting Vaseline on buttocks, mom stated yes. Asked mom to call for LC tonight to see latch. Informed RN of rash. Per mom. ? ?Patient Name: Bonnie Weaver ?Today's Date: 12/28/2021 ?  ?Age:36 days ? ?Maternal Data ?  ? ?Feeding ?  ? ?LATCH Score ?  ? ?  ? ?  ? ?  ? ?  ? ?  ? ? ?Lactation Tools Discussed/Used ?  ? ?Interventions ?  ? ?Discharge ?  ? ?Consult Status ?  ? ? ? ?Charyl Dancer ?12/28/2021, 8:38 PM ? ? ? ?

## 2021-12-28 NOTE — Telephone Encounter (Signed)
Called pharmacy and confirmed prescription with Subutex usage per Joellyn Haff. ?

## 2021-12-29 ENCOUNTER — Ambulatory Visit: Payer: Self-pay

## 2021-12-29 NOTE — Lactation Note (Addendum)
This note was copied from a baby's chart. ?Lactation Consultation Note ? ?Patient Name: Bonnie Weaver ?Today's Date: 12/29/2021 ?Reason for consult: Follow-up assessment;Term (Infant is ESC, high weight loss of -11%, mom is breast and bottle feeding with 24kcal formula. See mom's MR on suboxone.) ?Age:36 days ?Per Mom, infant recently given 60 mls of formula ( 24 kcal). ?Per mom, she  pumped of EBM  that was sitting on the counter. ?Mom knows to increase infant supplement of EBM and formula to 60 mls + give infant as much as she wants ( due to high weight loss) , past 60 mls , to pace feeding. ?Per mom, she is using the Doctor's Browns nipple with infant. ?Mom's current plan: ?1- BF infant according to hunger cues, 8 to 12+ times within 24 hours, skin to skin. ?2- Due to infant's weight loss mom will limit feedings at the breast to 15 minutes ;and then supplement infant with her EBM and formula until infant regain's weight that was loss. ?3- After latching infant at the breast, mom will then offer infant  the 27 mls  EBM first that was pumped  and then offer 33 mls of formula at the next feeding. ?4- Mom will continue to pump every 3 hours for 15 minutes on initial setting and give infant her pumped EBM and then formula for  every feeding. ? ?Maternal Data ?  ? ?Feeding ?Mother's Current Feeding Choice: Breast Milk and Formula ?Nipple Type: Other (Dr Irving Burton Wide base preemie) ? ?LATCH Score ?  ? ?  ? ?  ? ?  ? ?  ? ?  ? ? ?Lactation Tools Discussed/Used ?Pumped volume: 27 mL (Mom pumped 27 mls of EBM) ? ?Interventions ?Interventions: Education;DEBP;Pace feeding ? ?Discharge ?  ? ?Consult Status ?Consult Status: Follow-up ?Date: 12/30/21 ?Follow-up type: In-patient ? ? ? ?Bonnie Weaver ?12/29/2021, 5:30 PM ? ? ? ?

## 2021-12-30 ENCOUNTER — Ambulatory Visit: Payer: Self-pay

## 2021-12-30 NOTE — Lactation Note (Addendum)
This note was copied from a baby's chart. ?Lactation Consultation Note ? ?Patient Name: Bonnie Weaver ?Today's Date: 12/30/2021 ?Reason for consult: Follow-up assessment;1st time breastfeeding;Term (ESC, see mom's MR, improvement in weight from -11% to -9% today.) ?Age:36 days ?Per mom, she is latching infant at the breast for every  feeding for 15 minutes and then afterwards infant is being supplemented with EBM first and then formula. ?Per mom,  she recent feeding infant for 15 minutes at 1530 pm , g infant was given 13 mls of EBM and 45 mls of 24 kcal total comfort total intake was 58 mls. ?LC observed in flow sheet most feeding are 60 mls + EBM/Formula. ?[place] praised mom on her BF efforts and supplementing infant with EBM/Formula to help stabilize weight.  ?Mom will continue to follow previously plan but due to infant's age/ hours of life mom will increase supplement to 60 -75 mls per day. ?Maternal Data ?  ? ?Feeding ?Mother's Current Feeding Choice: Breast Milk and Formula ? ?LATCH Score ?  ? ?  ? ?  ? ?  ? ?  ? ?  ? ? ?Lactation Tools Discussed/Used ?  ? ?Interventions ?Interventions: Education;DEBP;Pace feeding ? ?Discharge ?  ? ?Consult Status ?Consult Status: Follow-up ?Date: 12/31/21 ?Follow-up type: In-patient ? ? ? ?Vicente Serene ?12/30/2021, 5:20 PM ? ? ? ?

## 2022-01-01 ENCOUNTER — Other Ambulatory Visit: Payer: Self-pay

## 2022-01-01 ENCOUNTER — Ambulatory Visit (INDEPENDENT_AMBULATORY_CARE_PROVIDER_SITE_OTHER): Payer: Medicaid Other | Admitting: Advanced Practice Midwife

## 2022-01-01 ENCOUNTER — Encounter: Payer: Self-pay | Admitting: Advanced Practice Midwife

## 2022-01-01 ENCOUNTER — Other Ambulatory Visit: Payer: Medicaid Other

## 2022-01-01 VITALS — BP 123/86 | HR 102 | Ht 62.0 in | Wt 181.0 lb

## 2022-01-01 DIAGNOSIS — Z09 Encounter for follow-up examination after completed treatment for conditions other than malignant neoplasm: Secondary | ICD-10-CM

## 2022-01-01 NOTE — Progress Notes (Signed)
?  HPI: ?Patient returns for routine postoperative follow-up having undergone repeat CS on 12/24/21.  ?The patient's immediate postoperative recovery has been unremarkable. ?Since hospital discharge the patient reports having a BP of 136/101, otherwise normal.   ? ? ?Current Outpatient Medications: ?Accu-Chek Softclix Lancets lancets, Check blood sugar four times daily., Disp: 100 each, Rfl: 12 ?acetaminophen (TYLENOL) 500 MG tablet, Take 500-1,000 mg by mouth every 6 (six) hours as needed for mild pain., Disp: , Rfl:  ?acyclovir (ZOVIRAX) 800 MG tablet, Take 800 mg by mouth daily., Disp: , Rfl:  ?albuterol (VENTOLIN HFA) 108 (90 Base) MCG/ACT inhaler, 2 puffs every 6 (six) hours as needed for shortness of breath or wheezing., Disp: , Rfl:  ?Blood Glucose Monitoring Suppl (ACCU-CHEK GUIDE ME) w/Device KIT, 1 kit by Does not apply route in the morning, at noon, in the evening, and at bedtime., Disp: 1 kit, Rfl: 0 ?Blood Pressure Monitor MISC, For regular home bp monitoring during pregnancy, Disp: 1 each, Rfl: 0 ?buprenorphine (SUBUTEX) 8 MG SUBL SL tablet, Place 4 mg under the tongue 4 (four) times daily., Disp: , Rfl:  ?desvenlafaxine (PRISTIQ) 25 MG 24 hr tablet, Take 25 mg by mouth daily., Disp: , Rfl:  ?diphenhydrAMINE (BENADRYL) 25 mg capsule, Take 25 mg by mouth every 6 (six) hours as needed., Disp: , Rfl:  ?Doxylamine-Pyridoxine (DICLEGIS) 10-10 MG TBEC, Take 2 qhs; may also take one in am and one in afternoon prn nausea (Patient not taking: Reported on 12/25/2021), Disp: 120 tablet, Rfl: 6 ?ferrous sulfate 325 (65 FE) MG tablet, Take 1 tablet (325 mg total) by mouth every other day. (Patient taking differently: Take 325 mg by mouth daily.), Disp: 45 tablet, Rfl: 2 ?furosemide (LASIX) 20 MG tablet, Take 1 tablet (20 mg total) by mouth daily for 5 days., Disp: 5 tablet, Rfl: 0 ?gabapentin (NEURONTIN) 300 MG capsule, Take 2 capsules (600 mg total) by mouth 3 (three) times daily., Disp: 30 capsule, Rfl: 0 ?glucose  blood (ACCU-CHEK GUIDE) test strip, Check blood sugar four times a day., Disp: 50 each, Rfl: 12 ?ibuprofen (ADVIL) 600 MG tablet, Take 1 tablet (600 mg total) by mouth every 6 (six) hours., Disp: 60 tablet, Rfl: 0 ?metFORMIN (GLUCOPHAGE) 1000 MG tablet, Take 1 tablet (1,000 mg total) by mouth 2 (two) times daily with a meal., Disp: 60 tablet, Rfl: 1 ?omeprazole (PRILOSEC) 20 MG capsule, Take 20 mg by mouth daily as needed (acid reflux)., Disp: , Rfl:  ?Prenatal Vit-Fe Fumarate-FA (PRENATAL VITAMIN PO), Take 1 tablet by mouth daily., Disp: , Rfl:  ?promethazine (PHENERGAN) 25 MG tablet, Take 1 tablet (25 mg total) by mouth every 6 (six) hours as needed for nausea or vomiting., Disp: 30 tablet, Rfl: 1 ?TRINTELLIX 10 MG TABS tablet, Take 10 mg by mouth daily., Disp: , Rfl:  ? ?No current facility-administered medications for this visit. ? ? ? ?Last menstrual period 03/26/2021, unknown if currently breastfeeding. ? ?Physical Exam: ?Incision C/D w/o erythema, drainage or odor.  ? ?Diagnostic Tests: ?N/a ? ? ?Impression: ? ?1 weeks s/p CS, incision looks good ? ? ?Plan:  Log any elevated BPs into BBsx, someone will see them if they are too high ? ? ?Follow up: ? ?4/17 for PP check up and 2hr GTT ? ?Christin Fudge, CNM ? ?

## 2022-01-04 ENCOUNTER — Encounter: Payer: Medicaid Other | Admitting: Obstetrics & Gynecology

## 2022-01-23 ENCOUNTER — Encounter: Payer: Self-pay | Admitting: Women's Health

## 2022-01-27 ENCOUNTER — Ambulatory Visit
Admission: EM | Admit: 2022-01-27 | Discharge: 2022-01-27 | Disposition: A | Discharge status: Home / Self Care | Payer: Medicaid Other | Attending: Urgent Care | Admitting: Urgent Care

## 2022-01-27 DIAGNOSIS — L089 Local infection of the skin and subcutaneous tissue, unspecified: Secondary | ICD-10-CM | POA: Diagnosis not present

## 2022-01-27 DIAGNOSIS — T148XXA Other injury of unspecified body region, initial encounter: Secondary | ICD-10-CM

## 2022-01-27 MED ORDER — CEPHALEXIN 500 MG PO CAPS: 500.0000 mg | ORAL_CAPSULE | Freq: Three times a day (TID) | ORAL | 0 refills | Status: DC

## 2022-01-27 NOTE — Discharge Instructions (Signed)
Keep the wound covered using non-stick gauze and tight underwear to hold the gauze. Change the gauze 2-3 times daily, cleaning the area with gentle soap and water each time. Use Tylenol and/or ibuprofen for pain relief. Use Keflex for the wound infection.  ?

## 2022-01-27 NOTE — ED Provider Notes (Signed)
?[facility] ? ? ?MRN: [id number] DOB: 1986-04-05 ? ?Subjective:  ? ?Bonnie Weaver is a 36 y.o. female presenting for 4-day history of acute onset persistent and worsening abdominal pain, drainage of pus and irritation about the wound from her C-section.  She contacted her surgeon and was offered a visit this upcoming week on Monday.  However, patient's pain is getting worse and she is having a hard time with the drainage.  The C-section occurred about 5 weeks ago. ? ?No current facility-administered medications for this encounter. ? ?Current Outpatient Medications:  ?  acetaminophen (TYLENOL) 500 MG tablet, Take 500-1,000 mg by mouth every 6 (six) hours as needed for mild pain., Disp: , Rfl:  ?  acyclovir (ZOVIRAX) 800 MG tablet, Take 800 mg by mouth daily., Disp: , Rfl:  ?  albuterol (VENTOLIN HFA) 108 (90 Base) MCG/ACT inhaler, 2 puffs every 6 (six) hours as needed for shortness of breath or wheezing., Disp: , Rfl:  ?  Blood Pressure Monitor MISC, For regular home bp monitoring during pregnancy, Disp: 1 each, Rfl: 0 ?  buprenorphine (SUBUTEX) 8 MG SUBL SL tablet, Place 4 mg under the tongue 4 (four) times daily., Disp: , Rfl:  ?  desvenlafaxine (PRISTIQ) 25 MG 24 hr tablet, Take 25 mg by mouth daily., Disp: , Rfl:  ?  diphenhydrAMINE (BENADRYL) 25 mg capsule, Take 25 mg by mouth every 6 (six) hours as needed., Disp: , Rfl:  ?  ferrous sulfate 325 (65 FE) MG tablet, Take 1 tablet (325 mg total) by mouth every other day. (Patient not taking: Reported on 01/01/2022), Disp: 45 tablet, Rfl: 2 ?  furosemide (LASIX) 20 MG tablet, Take 1 tablet (20 mg total) by mouth daily for 5 days., Disp: 5 tablet, Rfl: 0 ?  gabapentin (NEURONTIN) 300 MG capsule, Take 2 capsules (600 mg total) by mouth 3 (three) times daily., Disp: 30 capsule, Rfl: 0 ?  ibuprofen (ADVIL) 600 MG tablet, Take 1 tablet (600 mg total) by mouth every 6 (six) hours., Disp: 60 tablet, Rfl: 0 ?  omeprazole (PRILOSEC) 20 MG capsule, Take 20  mg by mouth daily as needed (acid reflux)., Disp: , Rfl:  ?  Prenatal Vit-Fe Fumarate-FA (PRENATAL VITAMIN PO), Take 1 tablet by mouth daily., Disp: , Rfl:  ?  promethazine (PHENERGAN) 25 MG tablet, Take 1 tablet (25 mg total) by mouth every 6 (six) hours as needed for nausea or vomiting. (Patient not taking: Reported on 01/01/2022), Disp: 30 tablet, Rfl: 1 ?  TRINTELLIX 10 MG TABS tablet, Take 10 mg by mouth daily., Disp: , Rfl:   ? ?Allergies  ?Allergen Reactions  ? Sulfa Antibiotics Anaphylaxis and Hives  ? Codeine Rash  ? ? ?Past Medical History:  ?Diagnosis Date  ? Anxiety   ? Asthma   ? Bipolar 1 disorder (HCC)   ? Blood transfusion without reported diagnosis   ? post partum unsure if hemorrhage or not  ? Complication of anesthesia   ? aspirated with CS  ? Depression   ? History of kidney stones   ? History of pre-eclampsia in prior pregnancy, currently pregnant   ? HSV (herpes simplex virus) anogenital infection   ?  ? ?Past Surgical History:  ?Procedure Laterality Date  ? BREAST BIOPSY Right   ? CESAREAN SECTION    ? CESAREAN SECTION N/A 12/24/2021  ? Procedure: CESAREAN SECTION;  Surgeon: Myna Hidalgo, DO;  [facility]: [facility];  Service: Obstetrics;  Laterality: N/A;  ? CHOLECYSTECTOMY N/A 08/18/2021  ?  Procedure: LAPAROSCOPIC CHOLECYSTECTOMY;  Surgeon: Franky Macho, MD;  Location: [facility];  Service: General;  Laterality: N/A;  ? LITHOTRIPSY    ? MULTIPLE TOOTH EXTRACTIONS    ? TUBAL LIGATION  12/24/2021  ? Procedure: BILATERAL TUBAL LIGATION;  Surgeon: Myna Hidalgo, DO;  Location: [facility];  Service: Obstetrics;;  ? ? ?Family History  ?Problem Relation Age of Onset  ? Other Mother   ?     sepsis  ? Hypertension Father   ? Autoimmune disease Father   ? Sleep apnea Father   ? Asthma Sister   ? Diabetes Sister   ? Anxiety disorder Daughter   ? Healthy Daughter   ? Diabetes Maternal Grandmother   ? Lung cancer Maternal Grandfather   ? Rheum arthritis Paternal Grandmother   ? ? ?Social History  ? ?Tobacco Use   ? Smoking status: Former  ?  Packs/day: 0.25  ?  Years: 15.00  ?  Pack years: 3.75  ?  Types: Cigarettes  ? Smokeless tobacco: Never  ?Vaping Use  ? Vaping Use: Never used  ?Substance Use Topics  ? Alcohol use: No  ? Drug use: Not Currently  ? ? ?ROS ? ? ?Objective:  ? ?Vitals: ?BP 117/80 (BP Location: Right Arm)   Pulse 93   Temp 98.4 ?F (36.9 ?C) (Oral)   Resp 16   SpO2 97%   Breastfeeding No  ? ?Physical Exam ?Constitutional:   ?   General: She is not in acute distress. ?   Appearance: Normal appearance. She is well-developed. She is not ill-appearing, toxic-appearing or diaphoretic.  ?HENT:  ?   Head: Normocephalic and atraumatic.  ?   Nose: Nose normal.  ?   Mouth/Throat:  ?   Mouth: Mucous membranes are moist.  ?   Pharynx: Oropharynx is clear.  ?Eyes:  ?   General: No scleral icterus.    ?   Right eye: No discharge.     ?   Left eye: No discharge.  ?   Extraocular Movements: Extraocular movements intact.  ?   Conjunctiva/sclera: Conjunctivae normal.  ?Cardiovascular:  ?   Rate and Rhythm: Normal rate.  ?Pulmonary:  ?   Effort: Pulmonary effort is normal.  ?Abdominal:  ?   General: Bowel sounds are normal. There is no distension.  ?   Palpations: Abdomen is soft. There is no mass.  ?   Tenderness: There is abdominal tenderness (about the wound with drainage of purulence and surrounding erythema). There is no right CVA tenderness, left CVA tenderness, guarding or rebound.  ?Skin: ?   General: Skin is warm and dry.  ?Neurological:  ?   General: No focal deficit present.  ?   Mental Status: She is alert and oriented to person, place, and time.  ?Psychiatric:     ?   Mood and Affect: Mood normal.     ?   Behavior: Behavior normal.     ?   Thought Content: Thought content normal.     ?   Judgment: Judgment normal.  ? ? ? ? ? ?Assessment and Plan :  ? ?PDMP not reviewed this encounter. ? ?1. Wound infection   ? ?Recommended starting Keflex for open wound infection with associated cellulitis.  Use Tylenol and  ibuprofen.  Follow-up with surgeon soon as possible. Counseled patient on potential for adverse effects with medications prescribed/recommended today, ER and return-to-clinic precautions discussed, patient verbalized understanding. ? ?  ?Wallis Bamberg, PA-C ?01/27/22 1533 ? ?

## 2022-01-27 NOTE — ED Triage Notes (Signed)
Pt reports pain, redness and yellow bloody drainage in the c-section incision she had 5 week ago.  ?

## 2022-01-29 ENCOUNTER — Ambulatory Visit (INDEPENDENT_AMBULATORY_CARE_PROVIDER_SITE_OTHER): Payer: Medicaid Other | Admitting: Women's Health

## 2022-01-29 ENCOUNTER — Other Ambulatory Visit: Payer: Medicaid Other

## 2022-01-29 ENCOUNTER — Encounter: Payer: Self-pay | Admitting: Women's Health

## 2022-01-29 DIAGNOSIS — Z8632 Personal history of gestational diabetes: Secondary | ICD-10-CM | POA: Diagnosis not present

## 2022-01-29 DIAGNOSIS — Z98891 History of uterine scar from previous surgery: Secondary | ICD-10-CM

## 2022-01-29 NOTE — Progress Notes (Signed)
? ?POSTPARTUM VISIT ?Patient name: Bonnie Weaver MRN [id number]  Date of birth: 29-May-1986 ?Chief Complaint:   ?Postpartum Care ? ?History of Present Illness:   ?Bonnie Weaver is a 36 y.o. G29P2012 Caucasian female being seen today for a postpartum visit. She is 5 weeks postpartum following a repeat cesarean section, low transverse incision w/ bilateral salpingectomy at 39.0 gestational weeks. IOL: n/a Anesthesia: spinal.  Laceration: n/a.  Complications: none. Inpatient contraception: yes bilateral salpingectomy .   ?Pregnancy complicated by L9JT . ?Tobacco use: former . Substance use disorder: h/o, is currently on subutex and doing well ?Last pap smear: 11/23/21 and results were NILM w/ HRHPV negative. Next pap smear due: 2025 ?No LMP recorded. ? ?Postpartum course has been complicated by c/s incision infection, went to urgent care 4/15 and rx'd keflex . Bleeding scant staining. Bowel function is normal. Bladder function is normal. Urinary incontinence? no, fecal incontinence? no ?Patient is not sexually active. Last sexual activity: prior to birth of baby. Desired contraception: BTL done PP. Patient does not want a pregnancy in the future.  Desired family size is 2 children.  ? [facility] - 01/29/22 0920   ? ?  ? Pregnancy Intention Screening  ? Does the patient want to become pregnant in the next year? No   ? Does the patient's partner want to become pregnant in the next year? No   ? Would the patient like to discuss contraceptive options today? No   ?  ? Contraception Wrap Up  ? Current Method Female Sterilization   ? End Method Female Sterilization   ? Contraception Counseling Provided No   ? ?  ?  ? ?  ? ?The pregnancy intention screening data noted above was reviewed. Potential methods of contraception were discussed. The patient elected to proceed with Female Sterilization. ? ?Edinburgh Postpartum Depression Screening: negative ? Edinburgh Postnatal Depression Scale - 01/29/22 0919   ? ?  ? Edinburgh  Postnatal Depression Scale:  In the Past 7 Days  ? I have been able to laugh and see the funny side of things. 0   ? I have looked forward with enjoyment to things. 0   ? I have blamed myself unnecessarily when things went wrong. 0   ? I have been anxious or worried for no good reason. 1   ? I have felt scared or panicky for no good reason. 2   ? Things have been getting on top of me. 1   ? I have been so unhappy that I have had difficulty sleeping. 0   ? I have felt sad or miserable. 0   ? I have been so unhappy that I have been crying. 0   ? The thought of harming myself has occurred to me. 0   ? Edinburgh Postnatal Depression Scale Total 4   ? ?  ?  ? ?  ?  ? ?  10/12/2021  ?  9:58 AM 06/27/2021  ?  2:37 PM  ?GAD 7 : Generalized Anxiety Score  ?Nervous, Anxious, on Edge 2 1  ?Control/stop worrying 0 1  ?Worry too much - different things 1 0  ?Trouble relaxing 0 0  ?Restless 0 0  ?Easily annoyed or irritable 1 2  ?Afraid - awful might happen 0 1  ?Total GAD 7 Score 4 5  ? ? ? ?Baby's course has been uncomplicated. Baby is feeding by breast and bottle: milk supply inadequate . Infant has a pediatrician/family doctor? Yes.  Childcare strategy  if returning to work/school: family.  Pt has material needs met for her and baby: Yes.   ?Review of Systems:   ?Pertinent items are noted in HPI ?Denies Abnormal vaginal discharge w/ itching/odor/irritation, headaches, visual changes, shortness of breath, chest pain, abdominal pain, severe nausea/vomiting, or problems with urination or bowel movements. ?Pertinent History Reviewed:  ?Reviewed past medical,surgical, obstetrical and family history.  ?Reviewed problem list, medications and allergies. ?OB History  ?Gravida Para Term Preterm AB Living  ?_0 ?SAB IAB Ectopic Multiple Live Births  ?      0 2  ?  ?# Outcome Date GA Lbr Len/2nd Weight Sex Delivery Anes PTL Lv  ?3 Term 12/24/21 [redacted]w[redacted]d 7 lb 1.9 oz (3.23 kg) F CS-LTranv Spinal  LIV  ?2 Term 06/13/07 [id number] lb 1 oz  (3.657 kg) F CS-LTranv EPI N LIV  ?   Complications: Failure to Progress in First Stage, Gestational hypertension  ?1 AB           ? ?Physical Assessment:  ? ?Vitals:  ? 01/29/22 0920  ?BP: 123/84  ?Pulse: 91  ?Weight: 171 lb (77.6 kg)  ?Height: _1  (1.575 m)  ?Body mass index is 31.28 kg/m?. ? ?     Physical Examination:  ? General appearance: alert, well appearing, and in no distress ? Mental status: alert, oriented to person, place, and time ? Skin: warm & dry  ? Cardiovascular: normal heart rate noted  ? Respiratory: normal respiratory effort, no distress  ? Breasts: deferred, no complaints  ? Abdomen: soft, non-tender, remaining dermabond residue removed from incision, actual incision looks good, some erythema below it/on mons- appears maybe from adhesive. ~1.5cm open area Lt side of incision, only evident when you pull panus back. No erythema/induration/foul drainage.  ? Pelvic: examination not indicated. Thin prep pap obtained: No ? Rectal: not examined ? Extremities: Edema: none  ? ?Chaperone: N/A   ?      ?No results found for this or any previous visit (from the past 24 hour(s)).  ?Assessment & Plan:  ?1) Postpartum exam ?2) 5 wks s/p repeat cesarean section, low transverse incision and bilateral salpingectomy ?3) breast & bottle feeding> gave tips to help increase supply ?4) Depression screening ?5) Contraception s/p bilateral salpingectomy ?6) Resolving incisional infection> looks good today, continue keflex until completed, keep clean/dry, let usKoreanow if small opening Lt side of incision doesn't close or if anything worsens ? ?Essential components of care per ACOG recommendations: ? ?1.  Mood and well being:  ?If positive depression screen, discussed and plan developed.  ?If using tobacco we discussed reduction/cessation and risk of relapse ?If current substance abuse, we discussed and referral to local resources was offered.  ? ?2. Infant care and feeding:  ?If breastfeeding, discussed returning to  work, pumping, breastfeeding-associated pain, guidance regarding return to fertility while lactating if not using another method. If needed, patient was provided with a letter to be allowed to pump q 2-3hrs to support lactation in a private location with access to a refrigerator to store breastmilk.   ?Recommended that all caregivers be immunized for flu, pertussis and other preventable communicable diseases ?If pt does not have material needs met for her/baby, referred to local resources for help obtaining these. ? ?3. Sexuality, contraception and birth spacing ?Provided guidance regarding sexuality, management of dyspareunia, and resumption of intercourse ?Discussed avoiding interpregnancy interval <32m132m and recommended birth spacing of 18 months ? ?4.  Sleep and fatigue ?Discussed coping options for fatigue and sleep disruption ?Encouraged family/partner/community support of 4 hrs of uninterrupted sleep to help with mood and fatigue ? ?5. Physical recovery  ?If pt had a C/S, assessed incisional pain and providing guidance on normal vs prolonged recovery ?If pt had a laceration, perineal healing and pain reviewed.  ?If urinary or fecal incontinence, discussed management and referred to PT or uro/gyn if indicated  ?Patient should wait until incision completely healed prior to resuming physical activity. Discussed attainment of healthy weight. ? ?6.  Chronic disease management ?Discussed pregnancy complications if any, and their implications for future childbearing and long-term maternal health. ?Review recommendations for prevention of recurrent pregnancy complications, such as 17 hydroxyprogesterone caproate to reduce risk for recurrent PTB not applicable, or aspirin to reduce risk of preeclampsia not applicable. ?Pt had GDM: yes. If yes, 2hr GTT scheduled: doing today. ?Reviewed medications and non-pregnant dosing including consideration of whether pt is breastfeeding using a reliable resource such as LactMed:  not applicable ?Referred for f/u w/ PCP or subspecialist providers as indicated: keep scheduled appts ? ?7. Health maintenance ?Mammogram at 37yo or earlier if indicated ?Pap smears as indicated ? ?Meds: No

## 2022-01-29 NOTE — Patient Instructions (Signed)
Tips To Increase Milk Supply Lots of water! Enough so that your urine is clear Plenty of calories, if you're not getting enough calories, your milk supply can decrease Breastfeed/pump often, every 2-3 hours x 20-30mins Fenugreek 3 pills 3 times a day, this may make your urine smell like maple syrup Mother's Milk Tea Lactation cookies, google for the recipe Real oatmeal Body Armor sports drinks Greater Than hydration drink  

## 2022-01-30 ENCOUNTER — Other Ambulatory Visit: Payer: Self-pay | Admitting: Women's Health

## 2022-01-30 DIAGNOSIS — E7439 Other disorders of intestinal carbohydrate absorption: Secondary | ICD-10-CM

## 2022-01-30 DIAGNOSIS — Z8632 Personal history of gestational diabetes: Secondary | ICD-10-CM

## 2022-01-30 DIAGNOSIS — Z131 Encounter for screening for diabetes mellitus: Secondary | ICD-10-CM

## 2022-01-30 LAB — GLUCOSE TOLERANCE, 2 HOURS W/ 1HR
Glucose, 1 hour: 130 mg/dL (ref 70–179)
Glucose, 2 hour: 104 mg/dL (ref 70–152)
Glucose, Fasting: 95 mg/dL — ABNORMAL HIGH (ref 70–91)

## 2022-01-31 ENCOUNTER — Encounter: Payer: Self-pay | Admitting: Women's Health

## 2022-03-30 ENCOUNTER — Ambulatory Visit: Payer: Medicaid Other | Admitting: Advanced Practice Midwife

## 2022-04-05 ENCOUNTER — Encounter: Payer: Self-pay | Admitting: Adult Health

## 2022-04-05 ENCOUNTER — Ambulatory Visit (INDEPENDENT_AMBULATORY_CARE_PROVIDER_SITE_OTHER): Payer: Medicaid Other | Admitting: Adult Health

## 2022-04-05 VITALS — BP 114/76 | HR 85 | Ht 62.0 in | Wt 164.4 lb

## 2022-04-05 DIAGNOSIS — R102 Pelvic and perineal pain: Secondary | ICD-10-CM

## 2022-04-05 NOTE — Progress Notes (Signed)
  Subjective:     Patient ID: Bonnie Weaver, female   DOB: 1986/01/14, 36 y.o.   MRN: [id number]  [name] is a 36 year old white female, single,G3P2012, in complaining of pelvic pain for about 6 weeks. She had  C-section and tubal 12/24/21. PCP is [facility].  Lab Results  Component Value Date   DIAGPAP  11/23/2021    - Negative for intraepithelial lesion or malignancy (NILM)   HPVHIGH Negative 11/23/2021    Review of Systems Has had pelvic pain for about 6 weeks  Denies any fever, problems with urination or bowel movements Has had sex once and was not comfortable Reviewed past medical,surgical, social and family history. Reviewed medications and allergies.     Objective:   Physical Exam BP 114/76 (BP Location: Right Arm, Patient Position: Sitting, Cuff Size: Normal)   Pulse 85   Ht 5\' 2"  (1.575 m)   Wt 164 lb 6.4 oz (74.6 kg)   LMP 04/04/2022   Breastfeeding Yes Comment: mostly formular, then breastfeeding  BMI 30.07 kg/m     Skin warm and dry.Pelvic: external genitalia is normal in appearance no lesions, vagina:+period blood,urethra has no lesions or masses noted, cervix:smooth and bulbous, uterus: normal size, shape and contour, non tender, no masses felt, adnexa: no masses, LLQ tenderness noted,esp over bowel. Bladder is non tender and no masses felt.  Fall risk is low  [facility] - 04/05/22 1540       Pregnancy Intention Screening   Does the patient want to become pregnant in the next year? No    Does the patient's partner want to become pregnant in the next year? No    Would the patient like to discuss contraceptive options today? No      Contraception Wrap Up   Current Method Female Sterilization    End Method Female Sterilization    Contraception Counseling Provided No            Examination chaperoned by 04/07/22 LPN  Assessment:     1. Pelvic pain Scheduled Faith Rogue for 03/22/22 at 2:30 pm at [facility] to assess uterus and ovaries Push fluids  - [facility] PELVIC  COMPLETE WITH TRANSVAGINAL; Future     Plan:     Follow up with me 04/19/22 for ROS

## 2022-04-11 ENCOUNTER — Ambulatory Visit (HOSPITAL_COMMUNITY): Admission: RE | Admit: 2022-04-11 | Payer: Medicaid Other | Source: Ambulatory Visit

## 2022-04-19 ENCOUNTER — Ambulatory Visit: Payer: Medicaid Other | Admitting: Adult Health

## 2022-04-23 ENCOUNTER — Ambulatory Visit
Admission: EM | Admit: 2022-04-23 | Discharge: 2022-04-23 | Disposition: A | Discharge status: Home / Self Care | Payer: Medicaid Other | Attending: Urgent Care | Admitting: Urgent Care

## 2022-04-23 DIAGNOSIS — N3001 Acute cystitis with hematuria: Secondary | ICD-10-CM | POA: Diagnosis not present

## 2022-04-23 LAB — POCT URINALYSIS DIP (MANUAL ENTRY)
Glucose, UA: NEGATIVE mg/dL
Nitrite, UA: NEGATIVE
Protein Ur, POC: 100 mg/dL — AB
Spec Grav, UA: >=1.03 — AB (ref 1.010–1.025)
Urobilinogen, UA: 0.2 E.U./dL
pH, UA: 5.5 (ref 5.0–8.0)

## 2022-04-23 MED ORDER — CEPHALEXIN 500 MG PO CAPS: 500.0000 mg | ORAL_CAPSULE | Freq: Two times a day (BID) | ORAL | 0 refills | Status: DC

## 2022-04-23 NOTE — Discharge Instructions (Signed)

## 2022-04-23 NOTE — ED Triage Notes (Signed)
Pt presents with dysuria that began yesterday  

## 2022-04-23 NOTE — ED Provider Notes (Signed)
[facility]   MRN: [id number] DOB: Jun 07, 1986  Subjective:   Bonnie Weaver is a 36 y.o. female presenting for 1 day history of acute onset dysuria, urinary urgency, urinary frequency.  States that she is not able to get much urine out but feels like she needs cefazolin.  She has not taken much medications for relief as she is breast-feeding.  She does try to hydrate very well with 6 bottles of water.  Limits urinary irritants.  No current facility-administered medications for this encounter.  Current Outpatient Medications:    acyclovir (ZOVIRAX) 800 MG tablet, Take 800 mg by mouth daily., Disp: , Rfl:    albuterol (VENTOLIN HFA) 108 (90 Base) MCG/ACT inhaler, 2 puffs every 6 (six) hours as needed for shortness of breath or wheezing., Disp: , Rfl:    buprenorphine (SUBUTEX) 8 MG SUBL SL tablet, Place 4 mg under the tongue 4 (four) times daily., Disp: , Rfl:    desvenlafaxine (PRISTIQ) 25 MG 24 hr tablet, Take 25 mg by mouth daily., Disp: , Rfl:    ferrous sulfate 325 (65 FE) MG tablet, Take 1 tablet (325 mg total) by mouth every other day. (Patient not taking: Reported on 01/01/2022), Disp: 45 tablet, Rfl: 2   furosemide (LASIX) 20 MG tablet, Take 1 tablet (20 mg total) by mouth daily for 5 days., Disp: 5 tablet, Rfl: 0   omeprazole (PRILOSEC) 20 MG capsule, Take 20 mg by mouth daily as needed (acid reflux)., Disp: , Rfl:    TRINTELLIX 10 MG TABS tablet, Take 10 mg by mouth daily., Disp: , Rfl:    Allergies  Allergen Reactions   Sulfa Antibiotics Anaphylaxis and Hives   Codeine Rash    Past Medical History:  Diagnosis Date   Anxiety    Asthma    Bipolar 1 disorder (HCC)    Blood transfusion without reported diagnosis    post partum unsure if hemorrhage or not   Complication of anesthesia    aspirated with CS   Depression    History of kidney stones    History of pre-eclampsia in prior pregnancy, currently pregnant    HSV (herpes simplex virus) anogenital  infection      Past Surgical History:  Procedure Laterality Date   BREAST BIOPSY Right    CESAREAN SECTION     CESAREAN SECTION N/A 12/24/2021   Procedure: CESAREAN SECTION;  Surgeon: Myna Hidalgo, DO;  Location: [facility];  Service: Obstetrics;  Laterality: N/A;   CHOLECYSTECTOMY N/A 08/18/2021   Procedure: LAPAROSCOPIC CHOLECYSTECTOMY;  Surgeon: Franky Macho, MD;  Location: [facility];  Service: General;  Laterality: N/A;   LITHOTRIPSY     MULTIPLE TOOTH EXTRACTIONS     TUBAL LIGATION  12/24/2021   Procedure: BILATERAL TUBAL LIGATION;  Surgeon: Myna Hidalgo, DO;  Location: [facility];  Service: Obstetrics;;    Family History  Problem Relation Age of Onset   Rheum arthritis Paternal Grandmother    Diabetes Maternal Grandmother    Lung cancer Maternal Grandfather    Hypertension Father    Autoimmune disease Father    Sleep apnea Father    Other Mother        sepsis   Asthma Sister    Diabetes Sister    Anxiety disorder Daughter    Healthy Daughter     Social History   Tobacco Use   Smoking status: Some Days    Packs/day: 0.25    Years: 15.00    Total pack years:  3.75    Types: Cigarettes   Smokeless tobacco: Never  Vaping Use   Vaping Use: Never used  Substance Use Topics   Alcohol use: No   Drug use: Not Currently    ROS   Objective:   Vitals: BP 120/81   Pulse 87   Temp 98.2 F (36.8 C)   Resp 20   LMP 04/04/2022   SpO2 98%   Physical Exam Constitutional:      General: She is not in acute distress.    Appearance: Normal appearance. She is well-developed. She is not ill-appearing, toxic-appearing or diaphoretic.  HENT:     Head: Normocephalic and atraumatic.     Nose: Nose normal.     Mouth/Throat:     Mouth: Mucous membranes are moist.     Pharynx: Oropharynx is clear.  Eyes:     General: No scleral icterus.       Right eye: No discharge.        Left eye: No discharge.     Extraocular Movements: Extraocular movements intact.      Conjunctiva/sclera: Conjunctivae normal.  Cardiovascular:     Rate and Rhythm: Normal rate.  Pulmonary:     Effort: Pulmonary effort is normal.  Abdominal:     General: Bowel sounds are normal. There is no distension.     Palpations: Abdomen is soft. There is no mass.     Tenderness: There is no abdominal tenderness. There is no right CVA tenderness, left CVA tenderness, guarding or rebound.  Skin:    General: Skin is warm and dry.  Neurological:     General: No focal deficit present.     Mental Status: She is alert and oriented to person, place, and time.  Psychiatric:        Mood and Affect: Mood normal.        Behavior: Behavior normal.        Thought Content: Thought content normal.        Judgment: Judgment normal.     Results for orders placed or performed during the hospital encounter of 04/23/22 (from the past 24 hour(s))  POCT urinalysis dipstick     Status: Abnormal   Collection Time: 04/23/22  7:27 PM  Result Value Ref Range   Color, UA yellow yellow   Clarity, UA cloudy (A) clear   Glucose, UA negative negative mg/dL   Bilirubin, UA small (A) negative   Ketones, POC UA trace (5) (A) negative mg/dL   Spec Grav, UA >=5.465 (A) 1.010 - 1.025   Blood, UA moderate (A) negative   pH, UA 5.5 5.0 - 8.0   Protein Ur, POC =100 (A) negative mg/dL   Urobilinogen, UA 0.2 0.2 or 1.0 E.U./dL   Nitrite, UA Negative Negative   Leukocytes, UA Small (1+) (A) Negative    Assessment and Plan :   PDMP not reviewed this encounter.  1. Acute cystitis with hematuria    Start Keflex to cover for acute cystitis, urine culture pending.  Recommended aggressive hydration, limiting urinary irritants. Counseled patient on potential for adverse effects with medications prescribed/recommended today, ER and return-to-clinic precautions discussed, patient verbalized understanding.    Wallis Bamberg, [place] 04/23/22 0354

## 2022-04-24 LAB — URINE CULTURE: Culture: <10000 — AB

## 2022-05-01 ENCOUNTER — Other Ambulatory Visit: Payer: Medicaid Other

## 2022-05-01 DIAGNOSIS — Z8632 Personal history of gestational diabetes: Secondary | ICD-10-CM

## 2022-09-10 ENCOUNTER — Ambulatory Visit
Admission: EM | Admit: 2022-09-10 | Discharge: 2022-09-10 | Disposition: A | Discharge status: Home / Self Care | Payer: Medicaid Other | Attending: Family Medicine | Admitting: Family Medicine

## 2022-09-10 DIAGNOSIS — R21 Rash and other nonspecific skin eruption: Secondary | ICD-10-CM | POA: Diagnosis not present

## 2022-09-10 DIAGNOSIS — N390 Urinary tract infection, site not specified: Secondary | ICD-10-CM | POA: Insufficient documentation

## 2022-09-10 LAB — POCT URINALYSIS DIP (MANUAL ENTRY)
Glucose, UA: NEGATIVE mg/dL
Ketones, POC UA: NEGATIVE mg/dL
Nitrite, UA: NEGATIVE
Protein Ur, POC: 100 mg/dL — AB
Spec Grav, UA: >=1.03 — AB (ref 1.010–1.025)
Urobilinogen, UA: 0.2 E.U./dL
pH, UA: 5.5 (ref 5.0–8.0)

## 2022-09-10 MED ORDER — PHENAZOPYRIDINE HCL 200 MG PO TABS: 200.0000 mg | ORAL_TABLET | Freq: Three times a day (TID) | ORAL | 0 refills | Status: DC | PRN

## 2022-09-10 MED ORDER — NITROFURANTOIN MONOHYD MACRO 100 MG PO CAPS: 100.0000 mg | ORAL_CAPSULE | Freq: Two times a day (BID) | ORAL | 0 refills | Status: DC

## 2022-09-10 MED ORDER — TRIAMCINOLONE ACETONIDE 0.1 % EX CREA: 1.0000 | TOPICAL_CREAM | Freq: Two times a day (BID) | CUTANEOUS | 0 refills | Status: DC

## 2022-09-10 NOTE — ED Triage Notes (Signed)
Pt reports lower back pain and burning when urinating since this morning.   Pt reports itching bumps in right hand and right arm x 4 days.

## 2022-09-10 NOTE — ED Provider Notes (Signed)
[facility]    CSN: [id number] Arrival date & time: 09/10/22  1052      History   Chief Complaint Chief Complaint  Patient presents with   Dysuria   Back Pain    HPI Bonnie Weaver is a 36 y.o. female.   Patient presenting today with 1 day history of low back aching, dysuria, urinary frequency.  Denies fever, chills, nausea, hematuria, vaginal symptoms.  So far not trying thing over-the-counter for the symptoms.  She is also having some itchy bumps to her right hand and arm for the past 4 days.  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  Has not been trying anything over-the-counter for it thus far.    Past Medical History:  Diagnosis Date   Anxiety    Asthma    Bipolar 1 disorder (HCC)    Blood transfusion without reported diagnosis    post partum unsure if hemorrhage or not   Complication of anesthesia    aspirated with CS   Depression    History of kidney stones    History of pre-eclampsia in prior pregnancy, currently pregnant    HSV (herpes simplex virus) anogenital infection     Patient Active Problem List   Diagnosis Date Noted   Pelvic pain 04/05/2022   History of gestational diabetes 10/17/2021   Calculus of gallbladder without cholecystitis without obstruction    History of cesarean delivery 06/27/2021   History of pre-eclampsia in prior pregnancy, currently pregnant 06/27/2021   History of opioid abuse (HCC) 06/27/2021   Suboxone maintenance treatment complicating pregnancy, antepartum (HCC) 06/27/2021   Bipolar, Dep/anx, ADHD 06/27/2021   Smoker 06/27/2021   Pruritus 04/21/2020   Keratosis pilaris 03/15/2020   Dyshidrotic eczema 03/15/2020   Seborrheic keratosis 03/15/2020   Seborrheic dermatitis 03/15/2020   History of asthma 09/21/2019   Genital herpes simplex 09/21/2019    Past Surgical History:  Procedure Laterality Date   BREAST BIOPSY Right    CESAREAN SECTION     CESAREAN SECTION N/A  12/24/2021   Procedure: CESAREAN SECTION;  Surgeon: Myna Hidalgo, DO;  Location: [facility];  Service: Obstetrics;  Laterality: N/A;   CHOLECYSTECTOMY N/A 08/18/2021   Procedure: LAPAROSCOPIC CHOLECYSTECTOMY;  Surgeon: Franky Macho, MD;  Location: [facility];  Service: General;  Laterality: N/A;   LITHOTRIPSY     MULTIPLE TOOTH EXTRACTIONS     TUBAL LIGATION  12/24/2021   Procedure: BILATERAL TUBAL LIGATION;  Surgeon: Myna Hidalgo, DO;  Location: [facility];  Service: Obstetrics;;    OB History     Gravida  3   Para  2   Term  2   Preterm      AB  1   Living  2      SAB      IAB      Ectopic      Multiple  0   Live Births  2            Home Medications    Prior to Admission medications   Medication Sig Start Date End Date Taking? Authorizing Provider  nitrofurantoin, macrocrystal-monohydrate, (MACROBID) 100 MG capsule Take 1 capsule (100 mg total) by mouth 2 (two) times daily. 09/10/22  Yes Particia Nearing, PA-C  phenazopyridine (PYRIDIUM) 200 MG tablet Take 1 tablet (200 mg total) by mouth 3 (three) times daily as needed for pain. 09/10/22  Yes Particia Nearing, PA-C  triamcinolone  cream (KENALOG) 0.1 % Apply 1 Application topically 2 (two) times daily. 09/10/22  Yes Particia Nearing, PA-C  acyclovir (ZOVIRAX) 800 MG tablet Take 800 mg by mouth daily. 07/08/19   [provider]  albuterol (VENTOLIN HFA) 108 (90 Base) MCG/ACT inhaler 2 puffs every 6 (six) hours as needed for shortness of breath or wheezing. 03/20/19   [provider]  buprenorphine (SUBUTEX) 8 MG SUBL SL tablet Place 4 mg under the tongue 4 (four) times daily. 10/25/21   [provider]  cephALEXin (KEFLEX) 500 MG capsule Take 1 capsule (500 mg total) by mouth 2 (two) times daily. 04/23/22   Wallis Bamberg, PA-C  desvenlafaxine (PRISTIQ) 25 MG 24 hr tablet Take 25 mg by mouth daily. 12/12/21   [provider]  ferrous sulfate 325 (65 FE) MG tablet Take 1  tablet (325 mg total) by mouth every other day. Patient not taking: Reported on 01/01/2022 10/17/21   Cheral Marker, CNM  furosemide (LASIX) 20 MG tablet Take 1 tablet (20 mg total) by mouth daily for 5 days. 12/27/21 01/01/22  Raelyn Mora, CNM  omeprazole (PRILOSEC) 20 MG capsule Take 20 mg by mouth daily as needed (acid reflux).    [provider]  TRINTELLIX 10 MG TABS tablet Take 10 mg by mouth daily. 12/12/21   [provider]    Family History Family History  Problem Relation Age of Onset   Rheum arthritis Paternal Grandmother    Diabetes Maternal Grandmother    Lung cancer Maternal Grandfather    Hypertension Father    Autoimmune disease Father    Sleep apnea Father    Other Mother        sepsis   Asthma Sister    Diabetes Sister    Anxiety disorder Daughter    Healthy Daughter     Social History Social History   Tobacco Use   Smoking status: Some Days    Packs/day: 0.25    Years: 15.00    Total pack years: 3.75    Types: Cigarettes   Smokeless tobacco: Never  Vaping Use   Vaping Use: Never used  Substance Use Topics   Alcohol use: No   Drug use: Not Currently   Allergies   Sulfa antibiotics and Codeine  Review of Systems Review of Systems PER HPI  Physical Exam Triage Vital Signs ED Triage Vitals  Enc Vitals Group     BP 09/10/22 1134 121/70     Pulse Rate 09/10/22 1134 90     Resp 09/10/22 1134 18     Temp 09/10/22 1134 98.6 F (37 C)     Temp Source 09/10/22 1134 Oral     SpO2 09/10/22 1134 96 %     Weight --      Height --      Head Circumference --      Peak Flow --      Pain Score 09/10/22 1133 5     Pain Loc --      Pain Edu? --      Excl. in GC? --    No data found.  Updated Vital Signs BP 121/70 (BP Location: Right Arm)   Pulse 90   Temp 98.6 F (37 C) (Oral)   Resp 18   LMP 08/16/2022 (Exact Date)   SpO2 96%   Breastfeeding No   Visual Acuity Right Eye Distance:   Left Eye Distance:   Bilateral  Distance:    Right Eye Near:  Left Eye Near:    Bilateral Near:     Physical Exam Vitals and nursing note reviewed.  Constitutional:      Appearance: Normal appearance. She is not ill-appearing.  HENT:     Head: Atraumatic.  Eyes:     Extraocular Movements: Extraocular movements intact.     Conjunctiva/sclera: Conjunctivae normal.  Cardiovascular:     Rate and Rhythm: Normal rate and regular rhythm.     Heart sounds: Normal heart sounds.  Pulmonary:     Effort: Pulmonary effort is normal.     Breath sounds: Normal breath sounds.  Abdominal:     General: Bowel sounds are normal. There is no distension.     Palpations: Abdomen is soft.     Tenderness: There is no abdominal tenderness. There is no guarding.  Musculoskeletal:        General: Normal range of motion.     Cervical back: Normal range of motion and neck supple.  Skin:    General: Skin is warm and dry.     Comments: Erythematous papular lesions sporadic across right hand, right forearm consistent with an insect bite  Neurological:     Mental Status: She is alert and oriented to person, place, and time.  Psychiatric:        Mood and Affect: Mood normal.        Thought Content: Thought content normal.        Judgment: Judgment normal.      UC Treatments / Results  Labs (all labs ordered are listed, but only abnormal results are displayed) Labs Reviewed  POCT URINALYSIS DIP (MANUAL ENTRY) - Abnormal; Notable for the following components:      Result Value   Bilirubin, UA small (*)    Spec Grav, UA >=1.030 (*)    Blood, UA moderate (*)    Protein Ur, POC =100 (*)    Leukocytes, UA Large (3+) (*)    All other components within normal limits  URINE CULTURE   EKG  Radiology No results found.  Procedures Procedures (including critical care time)  Medications Ordered in UC Medications - No data to display  Initial Impression / Assessment and Plan / UC Course  I have reviewed the triage vital signs and  the nursing notes.  Pertinent labs & imaging results that were available during my care of the patient were reviewed by me and considered in my medical decision making (see chart for details).     Notes that showing evidence of urinary tract infection, urine culture pending, treat with Macrobid, Pyridium and adjust if needed based on culture.  Treat suspected insect bites with triamcinolone cream as needed.  Return for worsening symptoms.  Final Clinical Impressions(s) / UC Diagnoses   Final diagnoses:  Acute lower UTI  Rash   Discharge Instructions   None    ED Prescriptions     Medication Sig Dispense Auth. Provider   triamcinolone cream (KENALOG) 0.1 % Apply 1 Application topically 2 (two) times daily. 30 g Particia Nearing, PA-C   nitrofurantoin, macrocrystal-monohydrate, (MACROBID) 100 MG capsule Take 1 capsule (100 mg total) by mouth 2 (two) times daily. 10 capsule Particia Nearing, [place]   phenazopyridine (PYRIDIUM) 200 MG tablet Take 1 tablet (200 mg total) by mouth 3 (three) times daily as needed for pain. 9 tablet Particia Nearing, [place]      PDMP not reviewed this encounter.   Particia Nearing, [place] 09/10/22 1209

## 2022-09-12 LAB — URINE CULTURE: Culture: >=100000 — AB

## 2022-09-16 ENCOUNTER — Ambulatory Visit
Admission: EM | Admit: 2022-09-16 | Discharge: 2022-09-16 | Disposition: A | Discharge status: Home / Self Care | Payer: Medicaid Other | Attending: Family Medicine | Admitting: Family Medicine

## 2022-09-16 DIAGNOSIS — R21 Rash and other nonspecific skin eruption: Secondary | ICD-10-CM

## 2022-09-16 MED ORDER — METHYLPREDNISOLONE SODIUM SUCC 125 MG IJ SOLR
60.0000 mg | Freq: Once | INTRAMUSCULAR | Status: AC
  Administered 2022-09-16: 60 mg via INTRAMUSCULAR

## 2022-09-16 MED ORDER — PERMETHRIN 5 % EX CREA: TOPICAL_CREAM | CUTANEOUS | 0 refills | Status: DC

## 2022-09-16 NOTE — ED Provider Notes (Signed)
[facility]    CSN: [id number] Arrival date & time: 09/16/22  1331      History   Chief Complaint No chief complaint on file.   HPI Bonnie Weaver is a 36 y.o. female.   Patient presenting today with ongoing itchy red spots on her feet and legs that started over a week ago but were mostly on her hands at that point.  She started the triamcinolone cream that was prescribed here at that time and states it helped very briefly but the bumps remain and stay itchy.  No new exposures, foods, medications.  Has had scabies before and is concerned that this might be scabies again.  Her daughter now has a few bumps coming up as well but they look different.    Past Medical History:  Diagnosis Date   Anxiety    Asthma    Bipolar 1 disorder (HCC)    Blood transfusion without reported diagnosis    post partum unsure if hemorrhage or not   Complication of anesthesia    aspirated with CS   Depression    History of kidney stones    History of pre-eclampsia in prior pregnancy, currently pregnant    HSV (herpes simplex virus) anogenital infection     Patient Active Problem List   Diagnosis Date Noted   Pelvic pain 04/05/2022   History of gestational diabetes 10/17/2021   Calculus of gallbladder without cholecystitis without obstruction    History of cesarean delivery 06/27/2021   History of pre-eclampsia in prior pregnancy, currently pregnant 06/27/2021   History of opioid abuse (HCC) 06/27/2021   Suboxone maintenance treatment complicating pregnancy, antepartum (HCC) 06/27/2021   Bipolar, Dep/anx, ADHD 06/27/2021   Smoker 06/27/2021   Pruritus 04/21/2020   Keratosis pilaris 03/15/2020   Dyshidrotic eczema 03/15/2020   Seborrheic keratosis 03/15/2020   Seborrheic dermatitis 03/15/2020   History of asthma 09/21/2019   Genital herpes simplex 09/21/2019    Past Surgical History:  Procedure Laterality Date   BREAST BIOPSY Right    CESAREAN SECTION     CESAREAN  SECTION N/A 12/24/2021   Procedure: CESAREAN SECTION;  Surgeon: Myna Hidalgo, DO;  Location: [facility];  Service: Obstetrics;  Laterality: N/A;   CHOLECYSTECTOMY N/A 08/18/2021   Procedure: LAPAROSCOPIC CHOLECYSTECTOMY;  Surgeon: Franky Macho, MD;  Location: [facility];  Service: General;  Laterality: N/A;   LITHOTRIPSY     MULTIPLE TOOTH EXTRACTIONS     TUBAL LIGATION  12/24/2021   Procedure: BILATERAL TUBAL LIGATION;  Surgeon: Myna Hidalgo, DO;  Location: [facility];  Service: Obstetrics;;    OB History     Gravida  3   Para  2   Term  2   Preterm      AB  1   Living  2      SAB      IAB      Ectopic      Multiple  0   Live Births  2            Home Medications    Prior to Admission medications   Medication Sig Start Date End Date Taking? Authorizing Provider  permethrin (ELIMITE) 5 % cream Apply to affected area once at bedtime, wash off in AM 09/16/22  Yes Particia Nearing, PA-C  acyclovir (ZOVIRAX) 800 MG tablet Take 800 mg by mouth daily. 07/08/19   [provider]  albuterol (VENTOLIN HFA) 108 (90 Base) MCG/ACT inhaler 2 puffs every 6 (  six) hours as needed for shortness of breath or wheezing. 03/20/19   [provider]  buprenorphine (SUBUTEX) 8 MG SUBL SL tablet Place 4 mg under the tongue 4 (four) times daily. 10/25/21   [provider]  cephALEXin (KEFLEX) 500 MG capsule Take 1 capsule (500 mg total) by mouth 2 (two) times daily. 04/23/22   Wallis Bamberg, PA-C  desvenlafaxine (PRISTIQ) 25 MG 24 hr tablet Take 25 mg by mouth daily. 12/12/21   [provider]  ferrous sulfate 325 (65 FE) MG tablet Take 1 tablet (325 mg total) by mouth every other day. Patient not taking: Reported on 01/01/2022 10/17/21   Cheral Marker, CNM  furosemide (LASIX) 20 MG tablet Take 1 tablet (20 mg total) by mouth daily for 5 days. 12/27/21 01/01/22  Raelyn Mora, CNM  nitrofurantoin, macrocrystal-monohydrate, (MACROBID) 100 MG capsule Take 1  capsule (100 mg total) by mouth 2 (two) times daily. 09/10/22   Particia Nearing, PA-C  omeprazole (PRILOSEC) 20 MG capsule Take 20 mg by mouth daily as needed (acid reflux).    [provider]  phenazopyridine (PYRIDIUM) 200 MG tablet Take 1 tablet (200 mg total) by mouth 3 (three) times daily as needed for pain. 09/10/22   Particia Nearing, PA-C  triamcinolone cream (KENALOG) 0.1 % Apply 1 Application topically 2 (two) times daily. 09/10/22   Particia Nearing, PA-C  TRINTELLIX 10 MG TABS tablet Take 10 mg by mouth daily. 12/12/21   [provider]    Family History Family History  Problem Relation Age of Onset   Rheum arthritis Paternal Grandmother    Diabetes Maternal Grandmother    Lung cancer Maternal Grandfather    Hypertension Father    Autoimmune disease Father    Sleep apnea Father    Other Mother        sepsis   Asthma Sister    Diabetes Sister    Anxiety disorder Daughter    Healthy Daughter     Social History Social History   Tobacco Use   Smoking status: Some Days    Packs/day: 0.25    Years: 15.00    Total pack years: 3.75    Types: Cigarettes   Smokeless tobacco: Never  Vaping Use   Vaping Use: Never used  Substance Use Topics   Alcohol use: No   Drug use: Not Currently     Allergies   Sulfa antibiotics and Codeine   Review of Systems Review of Systems Per HPI  Physical Exam Triage Vital Signs ED Triage Vitals  Enc Vitals Group     BP 09/16/22 1517 122/86     Pulse Rate 09/16/22 1517 88     Resp 09/16/22 1517 20     Temp 09/16/22 1517 97.8 F (36.6 C)     Temp Source 09/16/22 1517 Oral     SpO2 09/16/22 1517 95 %     Weight --      Height --      Head Circumference --      Peak Flow --      Pain Score 09/16/22 1515 0     Pain Loc --      Pain Edu? --      Excl. in GC? --    No data found.  Updated Vital Signs BP 122/86 (BP Location: Right Arm)   Pulse 88   Temp 97.8 F (36.6 C) (Oral)   Resp  20   LMP 09/09/2022 (Exact Date)  SpO2 95%   Visual Acuity Right Eye Distance:   Left Eye Distance:   Bilateral Distance:    Right Eye Near:   Left Eye Near:    Bilateral Near:     Physical Exam Vitals and nursing note reviewed.  Constitutional:      Appearance: Normal appearance. She is not ill-appearing.  HENT:     Head: Atraumatic.  Eyes:     Extraocular Movements: Extraocular movements intact.     Conjunctiva/sclera: Conjunctivae normal.  Cardiovascular:     Rate and Rhythm: Normal rate and regular rhythm.     Heart sounds: Normal heart sounds.  Pulmonary:     Effort: Pulmonary effort is normal.     Breath sounds: Normal breath sounds.  Musculoskeletal:        General: Normal range of motion.     Cervical back: Normal range of motion and neck supple.  Skin:    General: Skin is warm and dry.     Findings: Rash present.     Comments: Several erythematous papular lesions scattered across feet, ankles and lower legs, 1 area of linear scabbing to anterior lower leg  Neurological:     Mental Status: She is alert and oriented to person, place, and time.  Psychiatric:        Mood and Affect: Mood normal.        Thought Content: Thought content normal.        Judgment: Judgment normal.      UC Treatments / Results  Labs (all labs ordered are listed, but only abnormal results are displayed) Labs Reviewed - No data to display  EKG   Radiology No results found.  Procedures Procedures (including critical care time)  Medications Ordered in UC Medications  methylPREDNISolone sodium succinate (SOLU-MEDROL) 125 mg/2 mL injection 60 mg (60 mg Intramuscular Given 09/16/22 1550)    Initial Impression / Assessment and Plan / UC Course  I have reviewed the triage vital signs and the nursing notes.  Pertinent labs & imaging results that were available during my care of the patient were reviewed by me and considered in my medical decision making (see chart for  details).     Unclear if insect bites or scabies, will treat with IM Solu-Medrol given resistance with triamcinolone and a round of permethrin just in case it is in fact scabies.  Discussed treating the environment if any insects are found in the home and antihistamines as needed.  Final Clinical Impressions(s) / UC Diagnoses   Final diagnoses:  Rash   Discharge Instructions   None    ED Prescriptions     Medication Sig Dispense Auth. Provider   permethrin (ELIMITE) 5 % cream Apply to affected area once at bedtime, wash off in AM 60 g Volney American, [place]      PDMP not reviewed this encounter.   Volney American, [place] 09/16/22 1558

## 2022-09-16 NOTE — ED Triage Notes (Signed)
Pt reports rash spots on her feet and legs that started over a week ago. Complains of itchiness  She prescribed some steroid cream but no relief.

## 2022-10-03 ENCOUNTER — Ambulatory Visit
Admission: EM | Admit: 2022-10-03 | Discharge: 2022-10-03 | Discharge status: Absent without leave | Payer: Medicaid Other

## 2022-10-04 ENCOUNTER — Ambulatory Visit
Admission: EM | Admit: 2022-10-04 | Discharge: 2022-10-04 | Disposition: A | Discharge status: Home / Self Care | Payer: Medicaid Other | Attending: Nurse Practitioner | Admitting: Nurse Practitioner

## 2022-10-04 VITALS — BP 129/75 | HR 83 | Temp 97.7°F | Resp 20

## 2022-10-04 DIAGNOSIS — N898 Other specified noninflammatory disorders of vagina: Secondary | ICD-10-CM | POA: Diagnosis not present

## 2022-10-04 DIAGNOSIS — J329 Chronic sinusitis, unspecified: Secondary | ICD-10-CM | POA: Diagnosis not present

## 2022-10-04 DIAGNOSIS — J4531 Mild persistent asthma with (acute) exacerbation: Secondary | ICD-10-CM | POA: Insufficient documentation

## 2022-10-04 DIAGNOSIS — B9689 Other specified bacterial agents as the cause of diseases classified elsewhere: Secondary | ICD-10-CM | POA: Insufficient documentation

## 2022-10-04 LAB — POCT URINALYSIS DIP (MANUAL ENTRY)
Blood, UA: NEGATIVE
Glucose, UA: NEGATIVE mg/dL
Ketones, POC UA: NEGATIVE mg/dL
Leukocytes, UA: NEGATIVE
Nitrite, UA: NEGATIVE
Protein Ur, POC: 30 mg/dL — AB
Spec Grav, UA: >=1.03 — AB (ref 1.010–1.025)
Urobilinogen, UA: 0.2 E.U./dL
pH, UA: 6 (ref 5.0–8.0)

## 2022-10-04 MED ORDER — ALBUTEROL SULFATE (2.5 MG/3ML) 0.083% IN NEBU
2.5000 mg | INHALATION_SOLUTION | Freq: Once | RESPIRATORY_TRACT | Status: AC
  Administered 2022-10-04: 2.5 mg via RESPIRATORY_TRACT

## 2022-10-04 MED ORDER — AMOXICILLIN-POT CLAVULANATE 875-125 MG PO TABS: 1.0000 | ORAL_TABLET | Freq: Two times a day (BID) | ORAL | 0 refills | Status: AC

## 2022-10-04 MED ORDER — PREDNISONE 20 MG PO TABS: 40.0000 mg | ORAL_TABLET | Freq: Every day | ORAL | 0 refills | Status: AC

## 2022-10-04 NOTE — ED Triage Notes (Signed)
Pt reports some cough and congestion , headache, vaginal clear discharge and burning after she urinates x 3 days. Took mucinex which gave slight relief.

## 2022-10-04 NOTE — Discharge Instructions (Addendum)
I think you have a bacterial sinus infection.  Please start the Augmentin and take the entire course to treat it.  We also gave you a breathing treatment today in urgent care to help with the wheezing.  Continue to use your albuterol inhaler every 4-6 hours as needed for wheezing or shortness of breath at home.  Continue Mucinex 600 mg twice daily, make sure you are drinking plenty of water, use steam showers or nasal saline rinses to help with nasal congestion.  Start the prednisone tomorrow to help with the inflammation in your chest from wheezing.  The urine sample today does not show signs of UTI.  We have tested the vaginal swab and will call you with abnormal results.

## 2022-10-04 NOTE — ED Provider Notes (Signed)
[facility]    CSN: [id number] Arrival date & time: 10/04/22  1555      History   Chief Complaint Chief Complaint  Patient presents with   Cough    Entered by patient    HPI Bonnie Weaver is a 36 y.o. female.   Patient presents today for 10 days of productive cough, chest pain after coughing, nasal congestion, wheezing, sinus pressure in her forehead and cheeks, and fatigue.  She denies fever, shortness of breath or chest tightness, sore throat, ear pain, abdominal pain, nausea/vomiting, diarrhea, decreased appetite, and new rash.  Reports her daughters were recently sick with similar symptoms, however they have improved.  She has been using her albuterol inhaler, taking Mucinex, and vitamin C which helps with symptoms temporarily.  Last albuterol use was a couple nights ago per her report.  She is also having clear vaginal discharge.  Reports it was yellow a couple of days ago.  No vaginal itching or new rashes/sores/lesions.  She has a little bit of burning at the end of urination.  Reports that she has recurrent BV before and after her menses.  Reports she took Macrobid a couple of months ago for a UTI, otherwise no recent antibiotic use.    Past Medical History:  Diagnosis Date   Anxiety    Asthma    Bipolar 1 disorder (HCC)    Blood transfusion without reported diagnosis    post partum unsure if hemorrhage or not   Complication of anesthesia    aspirated with CS   Depression    History of kidney stones    History of pre-eclampsia in prior pregnancy, currently pregnant    HSV (herpes simplex virus) anogenital infection     Patient Active Problem List   Diagnosis Date Noted   Pelvic pain 04/05/2022   History of gestational diabetes 10/17/2021   Calculus of gallbladder without cholecystitis without obstruction    History of cesarean delivery 06/27/2021   History of pre-eclampsia in prior pregnancy, currently pregnant 06/27/2021   History of opioid  abuse (HCC) 06/27/2021   Suboxone maintenance treatment complicating pregnancy, antepartum (HCC) 06/27/2021   Bipolar, Dep/anx, ADHD 06/27/2021   Smoker 06/27/2021   Pruritus 04/21/2020   Keratosis pilaris 03/15/2020   Dyshidrotic eczema 03/15/2020   Seborrheic keratosis 03/15/2020   Seborrheic dermatitis 03/15/2020   History of asthma 09/21/2019   Genital herpes simplex 09/21/2019    Past Surgical History:  Procedure Laterality Date   BREAST BIOPSY Right    CESAREAN SECTION     CESAREAN SECTION N/A 12/24/2021   Procedure: CESAREAN SECTION;  Surgeon: Myna Hidalgozan, Jennifer, DO;  Location: [facility];  Service: Obstetrics;  Laterality: N/A;   CHOLECYSTECTOMY N/A 08/18/2021   Procedure: LAPAROSCOPIC CHOLECYSTECTOMY;  Surgeon: Franky MachoJenkins, Mark, MD;  Location: [facility];  Service: General;  Laterality: N/A;   LITHOTRIPSY     MULTIPLE TOOTH EXTRACTIONS     TUBAL LIGATION  12/24/2021   Procedure: BILATERAL TUBAL LIGATION;  Surgeon: Myna Hidalgozan, Jennifer, DO;  Location: [facility];  Service: Obstetrics;;    OB History     Gravida  3   Para  2   Term  2   Preterm      AB  1   Living  2      SAB      IAB      Ectopic      Multiple  0   Live Births  2  Home Medications    Prior to Admission medications   Medication Sig Start Date End Date Taking? Authorizing Provider  amoxicillin-clavulanate (AUGMENTIN) 875-125 MG tablet Take 1 tablet by mouth 2 (two) times daily for 7 days. 10/04/22 10/11/22 Yes Valentino Nose, NP  predniSONE (DELTASONE) 20 MG tablet Take 2 tablets (40 mg total) by mouth daily with breakfast for 5 days. 10/04/22 10/09/22 Yes Valentino Nose, NP  acyclovir (ZOVIRAX) 800 MG tablet Take 800 mg by mouth daily. 07/08/19   [provider]  albuterol (VENTOLIN HFA) 108 (90 Base) MCG/ACT inhaler 2 puffs every 6 (six) hours as needed for shortness of breath or wheezing. 03/20/19   [provider]  buprenorphine (SUBUTEX) 8 MG SUBL SL tablet  Place 4 mg under the tongue 4 (four) times daily. 10/25/21   [provider]  desvenlafaxine (PRISTIQ) 25 MG 24 hr tablet Take 25 mg by mouth daily. 12/12/21   [provider]  ferrous sulfate 325 (65 FE) MG tablet Take 1 tablet (325 mg total) by mouth every other day. Patient not taking: Reported on 01/01/2022 10/17/21   Cheral Marker, CNM  omeprazole (PRILOSEC) 20 MG capsule Take 20 mg by mouth daily as needed (acid reflux).    [provider]  permethrin (ELIMITE) 5 % cream Apply to affected area once at bedtime, wash off in AM 09/16/22   Particia Nearing, PA-C  triamcinolone cream (KENALOG) 0.1 % Apply 1 Application topically 2 (two) times daily. 09/10/22   Particia Nearing, PA-C  TRINTELLIX 10 MG TABS tablet Take 10 mg by mouth daily. 12/12/21   [provider]    Family History Family History  Problem Relation Age of Onset   Rheum arthritis Paternal Grandmother    Diabetes Maternal Grandmother    Lung cancer Maternal Grandfather    Hypertension Father    Autoimmune disease Father    Sleep apnea Father    Other Mother        sepsis   Asthma Sister    Diabetes Sister    Anxiety disorder Daughter    Healthy Daughter     Social History Social History   Tobacco Use   Smoking status: Some Days    Packs/day: 0.25    Years: 15.00    Total pack years: 3.75    Types: Cigarettes   Smokeless tobacco: Never  Vaping Use   Vaping Use: Never used  Substance Use Topics   Alcohol use: No   Drug use: Not Currently     Allergies   Sulfa antibiotics and Codeine   Review of Systems Review of Systems Per HPI  Physical Exam Triage Vital Signs ED Triage Vitals  Enc Vitals Group     BP 10/04/22 1632 129/75     Pulse Rate 10/04/22 1632 83     Resp 10/04/22 1632 20     Temp 10/04/22 1632 97.7 F (36.5 C)     Temp Source 10/04/22 1632 Oral     SpO2 10/04/22 1632 95 %     Weight --      Height --      Head Circumference --       Peak Flow --      Pain Score 10/04/22 1635 0     Pain Loc --      Pain Edu? --      Excl. in [place]? --    No data found.  Updated Vital Signs BP 129/75 (BP Location: Right Arm)  Pulse 83   Temp 97.7 F (36.5 C) (Oral)   Resp 20   LMP 09/09/2022 (Exact Date)   SpO2 98%   Visual Acuity Right Eye Distance:   Left Eye Distance:   Bilateral Distance:    Right Eye Near:   Left Eye Near:    Bilateral Near:     Physical Exam Vitals and nursing note reviewed.  Constitutional:      General: She is not in acute distress.    Appearance: Normal appearance. She is not ill-appearing or toxic-appearing.  HENT:     Head: Normocephalic and atraumatic.     Right Ear: Tympanic membrane, ear canal and external ear normal.     Left Ear: Tympanic membrane, ear canal and external ear normal.     Nose: Congestion and rhinorrhea present.     Right Sinus: No maxillary sinus tenderness or frontal sinus tenderness.     Left Sinus: Maxillary sinus tenderness and frontal sinus tenderness present.     Mouth/Throat:     Mouth: Mucous membranes are moist.     Pharynx: Oropharynx is clear. Posterior oropharyngeal erythema present. No oropharyngeal exudate.  Eyes:     General: No scleral icterus.    Extraocular Movements: Extraocular movements intact.  Cardiovascular:     Rate and Rhythm: Normal rate and regular rhythm.  Pulmonary:     Effort: Pulmonary effort is normal. No respiratory distress.     Breath sounds: Wheezing present. No rhonchi or rales.  Abdominal:     General: Abdomen is flat. Bowel sounds are normal. There is no distension.     Palpations: Abdomen is soft.     Tenderness: There is no abdominal tenderness.  Genitourinary:    Comments: Deferred - self swab performed Musculoskeletal:     Cervical back: Normal range of motion and neck supple.  Lymphadenopathy:     Cervical: No cervical adenopathy.  Skin:    General: Skin is warm and dry.     Coloration: Skin is not jaundiced or  pale.     Findings: No erythema or rash.  Neurological:     Mental Status: She is alert and oriented to person, place, and time.     Motor: No weakness.     Gait: Gait normal.  Psychiatric:        Behavior: Behavior is cooperative.      UC Treatments / Results  Labs (all labs ordered are listed, but only abnormal results are displayed) Labs Reviewed  POCT URINALYSIS DIP (MANUAL ENTRY) - Abnormal; Notable for the following components:      Result Value   Bilirubin, UA small (*)    Spec Grav, UA >=1.030 (*)    Protein Ur, POC =30 (*)    All other components within normal limits  CERVICOVAGINAL ANCILLARY ONLY    EKG   Radiology No results found.  Procedures Procedures (including critical care time)  Medications Ordered in UC Medications  albuterol (PROVENTIL) (2.5 MG/3ML) 0.083% nebulizer solution 2.5 mg (2.5 mg Nebulization Given 10/04/22 1721)    Initial Impression / Assessment and Plan / UC Course  I have reviewed the triage vital signs and the nursing notes.  Pertinent labs & imaging results that were available during my care of the patient were reviewed by me and considered in my medical decision making (see chart for details).   Patient is well-appearing, normotensive, afebrile, not tachycardic, not tachypneic, oxygenating well on room air.  Initially in triage, oxygen saturation 95% on room  air.  After breathing treatment, oxygen saturation increased to 98% on room air.  Vaginal discharge Suspicious for bacterial vaginosis given history of recurrent infections Vaginal swab pending for trichomonas, gonorrhea, chlamydia, BV, and yeast infection If positive for BV, patient desires MetroGel instead of oral Flagyl  Bacterial sinusitis Treat with Augmentin twice daily for 7 days Supportive care discussed including nasal saline rinses, steam showers, Mucinex extra milligrams twice daily, and pushing hydration with water  Mild persistent asthma with acute  exacerbation Albuterol nebulizer given in urgent care today which helped with wheezing, but not did not fully get rid of it Start oral prednisone tomorrow for chest inflammation Continue albuterol inhaler at home every 4-6 hours as needed for wheezing or shortness of breath  The patient was given the opportunity to ask questions.  All questions answered to their satisfaction.  The patient is in agreement to this plan.    Final Clinical Impressions(s) / UC Diagnoses   Final diagnoses:  Vaginal discharge  Bacterial sinusitis  Mild persistent asthma with acute exacerbation     Discharge Instructions      I think you have a bacterial sinus infection.  Please start the Augmentin and take the entire course to treat it.  We also gave you a breathing treatment today in urgent care to help with the wheezing.  Continue to use your albuterol inhaler every 4-6 hours as needed for wheezing or shortness of breath at home.  Continue Mucinex 600 mg twice daily, make sure you are drinking plenty of water, use steam showers or nasal saline rinses to help with nasal congestion.  Start the prednisone tomorrow to help with the inflammation in your chest from wheezing.  The urine sample today does not show signs of UTI.  We have tested the vaginal swab and will call you with abnormal results.     ED Prescriptions     Medication Sig Dispense Auth. Provider   amoxicillin-clavulanate (AUGMENTIN) 875-125 MG tablet Take 1 tablet by mouth 2 (two) times daily for 7 days. 14 tablet Cathlean Marseilles A, NP   predniSONE (DELTASONE) 20 MG tablet Take 2 tablets (40 mg total) by mouth daily with breakfast for 5 days. 10 tablet Valentino Nose, NP      PDMP not reviewed this encounter.   Valentino Nose, NP 10/04/22 1731

## 2022-10-05 ENCOUNTER — Telehealth (HOSPITAL_COMMUNITY): Payer: Self-pay | Admitting: Emergency Medicine

## 2022-10-05 LAB — CERVICOVAGINAL ANCILLARY ONLY
Bacterial Vaginitis (gardnerella): NEGATIVE
Candida Glabrata: POSITIVE — AB
Candida Vaginitis: NEGATIVE
Chlamydia: NEGATIVE
Comment: NEGATIVE
Comment: NEGATIVE
Comment: NEGATIVE
Comment: NEGATIVE
Comment: NEGATIVE
Comment: NORMAL
Neisseria Gonorrhea: NEGATIVE
Trichomonas: NEGATIVE

## 2022-10-05 MED ORDER — FLUCONAZOLE 150 MG PO TABS: 150.0000 mg | ORAL_TABLET | Freq: Once | ORAL | 0 refills | Status: AC

## 2022-11-19 ENCOUNTER — Ambulatory Visit
Admission: EM | Admit: 2022-11-19 | Discharge: 2022-11-19 | Disposition: A | Discharge status: Home / Self Care | Payer: Medicaid Other | Attending: Nurse Practitioner | Admitting: Nurse Practitioner

## 2022-11-19 DIAGNOSIS — H109 Unspecified conjunctivitis: Secondary | ICD-10-CM | POA: Diagnosis not present

## 2022-11-19 MED ORDER — ERYTHROMYCIN 5 MG/GM OP OINT: TOPICAL_OINTMENT | Freq: Four times a day (QID) | OPHTHALMIC | 0 refills | Status: AC

## 2022-11-19 NOTE — ED Triage Notes (Signed)
Swelling, redness, pain in right eye that started 2 days ago. Patient states right eye is matted with yellow mucus when she wakes up.

## 2022-11-19 NOTE — ED Provider Notes (Signed)
[facility]    CSN: [id number] Arrival date & time: 11/19/22  1621      History   Chief Complaint Chief Complaint  Patient presents with   Eye Pain    HPI Bonnie Weaver is a 37 y.o. female.   Patient presents today for 2-day history of right eye pain, redness, and drainage.  Reports her eyelids were matted shut this morning.  Denies foreign body sensation, blurred or double vision, excessive tearing, headache, or recent cough/congestion/sore throat.  Reports she has had eye drainage of the day today.  She does have contact lenses, but has not used them in a few weeks.  Reports her family members were over over the weekend and their children had pinkeye.  Has not tried thing for symptoms so far.  Reports medical history significant for cellulitis of the right eye years ago.    Past Medical History:  Diagnosis Date   Anxiety    Asthma    Bipolar 1 disorder ([place])    Blood transfusion without reported diagnosis    post partum unsure if hemorrhage or not   Complication of anesthesia    aspirated with CS   Depression    History of kidney stones    History of pre-eclampsia in prior pregnancy, currently pregnant    HSV (herpes simplex virus) anogenital infection     Patient Active Problem List   Diagnosis Date Noted   Pelvic pain 04/05/2022   History of gestational diabetes 10/17/2021   Calculus of gallbladder without cholecystitis without obstruction    History of cesarean delivery 06/27/2021   History of pre-eclampsia in prior pregnancy, currently pregnant 06/27/2021   History of opioid abuse ([place]) 06/27/2021   Suboxone maintenance treatment complicating pregnancy, antepartum ([facility]) 06/27/2021   Bipolar, Dep/anx, ADHD 06/27/2021   Smoker 06/27/2021   Pruritus 04/21/2020   Keratosis pilaris 03/15/2020   Dyshidrotic eczema 03/15/2020   Seborrheic keratosis 03/15/2020   Seborrheic dermatitis 03/15/2020   History of asthma 09/21/2019   Genital herpes  simplex 09/21/2019    Past Surgical History:  Procedure Laterality Date   BREAST BIOPSY Right    CESAREAN SECTION     CESAREAN SECTION N/A 12/24/2021   Procedure: CESAREAN SECTION;  Surgeon: Janyth Pupa, DO;  Location: [facility];  Service: Obstetrics;  Laterality: N/A;   CHOLECYSTECTOMY N/A 08/18/2021   Procedure: LAPAROSCOPIC CHOLECYSTECTOMY;  Surgeon: Aviva Signs, MD;  Location: [facility];  Service: General;  Laterality: N/A;   LITHOTRIPSY     MULTIPLE TOOTH EXTRACTIONS     TUBAL LIGATION  12/24/2021   Procedure: BILATERAL TUBAL LIGATION;  Surgeon: Janyth Pupa, DO;  Location: [facility];  Service: Obstetrics;;    OB History     Gravida  3   Para  2   Term  2   Preterm      AB  1   Living  2      SAB      IAB      Ectopic      Multiple  0   Live Births  2            Home Medications    Prior to Admission medications   Medication Sig Start Date End Date Taking? Authorizing Provider  acyclovir (ZOVIRAX) 800 MG tablet Take 800 mg by mouth daily. 07/08/19  Yes [provider]  albuterol (VENTOLIN HFA) 108 (90 Base) MCG/ACT inhaler 2 puffs every 6 (six) hours as needed for shortness of  breath or wheezing. 03/20/19  Yes [provider]  buprenorphine (SUBUTEX) 8 MG SUBL SL tablet Place 4 mg under the tongue 4 (four) times daily. 10/25/21  Yes [provider]  desvenlafaxine (PRISTIQ) 25 MG 24 hr tablet Take 25 mg by mouth daily. 12/12/21  Yes [provider]  erythromycin ophthalmic ointment Place into the right eye 4 (four) times daily for 7 days. Place a 1/2 inch ribbon of ointment into the lower eyelid. 11/19/22 11/26/22 Yes Noemi Chapel A, NP  TRINTELLIX 10 MG TABS tablet Take 10 mg by mouth daily. 12/12/21  Yes [provider]  ferrous sulfate 325 (65 FE) MG tablet Take 1 tablet (325 mg total) by mouth every other day. Patient not taking: Reported on 01/01/2022 10/17/21   Roma Schanz, CNM  omeprazole (PRILOSEC)  20 MG capsule Take 20 mg by mouth daily as needed (acid reflux).    [provider]  permethrin (ELIMITE) 5 % cream Apply to affected area once at bedtime, wash off in AM 09/16/22   Volney American, PA-C  triamcinolone cream (KENALOG) 0.1 % Apply 1 Application topically 2 (two) times daily. 09/10/22   Volney American, PA-C    Family History Family History  Problem Relation Age of Onset   Rheum arthritis Paternal Grandmother    Diabetes Maternal Grandmother    Lung cancer Maternal Grandfather    Hypertension Father    Autoimmune disease Father    Sleep apnea Father    Other Mother        sepsis   Asthma Sister    Diabetes Sister    Anxiety disorder Daughter    Healthy Daughter     Social History Social History   Tobacco Use   Smoking status: Some Days    Packs/day: 0.25    Years: 15.00    Total pack years: 3.75    Types: Cigarettes   Smokeless tobacco: Never  Vaping Use   Vaping Use: Never used  Substance Use Topics   Alcohol use: No   Drug use: Not Currently     Allergies   Sulfa antibiotics and Codeine   Review of Systems Review of Systems Per HPI  Physical Exam Triage Vital Signs ED Triage Vitals  Enc Vitals Group     BP 11/19/22 1633 128/83     Pulse Rate 11/19/22 1633 88     Resp 11/19/22 1633 17     Temp 11/19/22 1633 98.1 F (36.7 C)     Temp Source 11/19/22 1633 Oral     SpO2 11/19/22 1633 94 %     Weight --      Height --      Head Circumference --      Peak Flow --      Pain Score 11/19/22 1634 5     Pain Loc --      Pain Edu? --      Excl. in [place]? --    No data found.  Updated Vital Signs BP 128/83 (BP Location: Right Arm)   Pulse 88   Temp 98.1 F (36.7 C) (Oral)   Resp 17   LMP 10/31/2022 (Exact Date)   SpO2 94%   Visual Acuity Right Eye Distance:   Left Eye Distance:   Bilateral Distance:    Right Eye Near:   Left Eye Near:    Bilateral Near:     Physical Exam Vitals and nursing note reviewed.   Constitutional:  General: She is not in acute distress.    Appearance: Normal appearance. She is not toxic-appearing.  Eyes:     General: Lids are normal.        Right eye: No discharge or hordeolum.        Left eye: No discharge or hordeolum.     Extraocular Movements:     Right eye: Normal extraocular motion.     Left eye: Normal extraocular motion.     Conjunctiva/sclera:     Right eye: Exudate present.     Left eye: No exudate.    Pupils: Pupils are equal, round, and reactive to light.     Comments: Right sclera slightly erythematous, crusting noted on the upper and lower lashes  Pulmonary:     Effort: Pulmonary effort is normal. No respiratory distress.  Skin:    General: Skin is warm and dry.     Capillary Refill: Capillary refill takes less than 2 seconds.     Coloration: Skin is not jaundiced or pale.     Findings: No erythema.  Neurological:     Mental Status: She is alert and oriented to person, place, and time.  Psychiatric:        Behavior: Behavior is cooperative.      UC Treatments / Results  Labs (all labs ordered are listed, but only abnormal results are displayed) Labs Reviewed - No data to display  EKG   Radiology No results found.  Procedures Procedures (including critical care time)  Medications Ordered in UC Medications - No data to display  Initial Impression / Assessment and Plan / UC Course  I have reviewed the triage vital signs and the nursing notes.  Pertinent labs & imaging results that were available during my care of the patient were reviewed by me and considered in my medical decision making (see chart for details).   Patient is well-appearing, normotensive, afebrile, not tachycardic, not tachypneic, oxygenating well on room air.    1. Conjunctivitis of right eye, unspecified conjunctivitis type Suspect bacterial conjunctivitis Treat with topical erythromycin ointment 4 times a day for 7 days Hand hygiene and supportive  care discussed including warm compresses Strict ER precautions discussed if swelling around the eye develops or pain with extraocular movements  The patient was given the opportunity to ask questions.  All questions answered to their satisfaction.  The patient is in agreement to this plan.   Final Clinical Impressions(s) / UC Diagnoses   Final diagnoses:  Conjunctivitis of right eye, unspecified conjunctivitis type     Discharge Instructions      I suspect you have pink eye of your right eye.  Use the erythromycin ointment to treat it.  Seek care if symptoms persist or worsen despite treatment.    ED Prescriptions     Medication Sig Dispense Auth. Provider   erythromycin ophthalmic ointment Place into the right eye 4 (four) times daily for 7 days. Place a 1/2 inch ribbon of ointment into the lower eyelid. 3.5 g Eulogio Bear, NP      PDMP not reviewed this encounter.   Eulogio Bear, NP 11/19/22 [phone]

## 2022-11-19 NOTE — Discharge Instructions (Signed)
I suspect you have pink eye of your right eye.  Use the erythromycin ointment to treat it.  Seek care if symptoms persist or worsen despite treatment.

## 2022-12-13 ENCOUNTER — Encounter: Payer: Self-pay | Admitting: Radiology

## 2023-06-05 ENCOUNTER — Encounter: Payer: Self-pay | Admitting: Emergency Medicine

## 2023-06-05 ENCOUNTER — Other Ambulatory Visit: Payer: Self-pay

## 2023-06-05 ENCOUNTER — Ambulatory Visit
Admission: EM | Admit: 2023-06-05 | Discharge: 2023-06-05 | Disposition: A | Discharge status: Home / Self Care | Payer: MEDICAID | Attending: Family Medicine | Admitting: Family Medicine

## 2023-06-05 DIAGNOSIS — R062 Wheezing: Secondary | ICD-10-CM | POA: Diagnosis not present

## 2023-06-05 DIAGNOSIS — J4521 Mild intermittent asthma with (acute) exacerbation: Secondary | ICD-10-CM

## 2023-06-05 MED ORDER — PREDNISONE 20 MG PO TABS: 40.0000 mg | ORAL_TABLET | Freq: Every day | ORAL | 0 refills | Status: DC

## 2023-06-05 NOTE — ED Triage Notes (Addendum)
Pt reports cough, nasal congestion, headache, ear fullness for last several weeks.

## 2023-06-08 NOTE — ED Provider Notes (Addendum)
[facility]   [id number] 06/05/23 Arrival Time: 1853  ASSESSMENT & PLAN:  1. Wheezing   2. Mild intermittent asthma with acute exacerbation    No resp distress. OTC symptom care as needed.  Discharge Medication List as of 06/05/2023  7:29 PM     START taking these medications   Details  predniSONE (DELTASONE) 20 MG tablet Take 2 tablets (40 mg total) by mouth daily., Starting Wed 06/05/2023, Normal         Follow-up Information     Mullis, Kiersten P, DO.   Specialty: Family Medicine Why: As needed. Contact information: [address] [place] [address] [phone]                 Reviewed expectations re: course of current medical issues. Questions answered. Outlined signs and symptoms indicating need for more acute intervention. Understanding verbalized. After Visit Summary given.   SUBJECTIVE: History from: Patient. Bonnie Weaver is a 37 y.o. female. Reports: Pt reports cough, nasal congestion, headache, ear fullness for last several weeks.  Denies: fever and difficulty breathing. Normal PO intake without n/v/d. Overall feeling better. Wheezing bothering her.   Social History   Tobacco Use  Smoking Status Some Days   Current packs/day: 0.25   Average packs/day: 0.3 packs/day for 15.0 years (3.8 ttl pk-yrs)   Types: Cigarettes  Smokeless Tobacco Never     OBJECTIVE:  Vitals:   06/05/23 1916  BP: 132/69  Pulse: 92  Resp: 20  Temp: 98.2 F (36.8 C)  TempSrc: Oral  SpO2: 97%    General appearance: alert; no distress Eyes: PERRLA; EOMI; conjunctiva normal HENT: [place]; AT; with nasal congestion Neck: supple  Lungs: speaks full sentences without difficulty; unlabored; bilat exp wheezing Extremities: no edema Skin: warm and dry Neurologic: normal gait Psychological: alert and cooperative; normal mood and affect  Labs: Results for orders placed or performed during the hospital encounter of 10/04/22  POCT urinalysis  dipstick  Result Value Ref Range   Color, UA yellow yellow   Clarity, UA clear clear   Glucose, UA negative negative mg/dL   Bilirubin, UA small (A) negative   Ketones, POC UA negative negative mg/dL   Spec Grav, UA >=2.130 (A) 1.010 - 1.025   Blood, UA negative negative   pH, UA 6.0 5.0 - 8.0   Protein Ur, POC =30 (A) negative mg/dL   Urobilinogen, UA 0.2 0.2 or 1.0 E.U./dL   Nitrite, UA Negative Negative   Leukocytes, UA Negative Negative  Cervicovaginal ancillary only  Result Value Ref Range   Neisseria Gonorrhea Negative    Chlamydia Negative    Trichomonas Negative    Bacterial Vaginitis (gardnerella) Negative    Candida Vaginitis Negative    Candida Glabrata Positive (A)    Comment      Normal Reference Range Bacterial Vaginosis - Negative   Comment Normal Reference Range Candida Species - Negative    Comment Normal Reference Range Candida Galbrata - Negative    Comment Normal Reference Range Trichomonas - Negative    Comment Normal Reference Ranger Chlamydia - Negative    Comment      Normal Reference Range Neisseria Gonorrhea - Negative   Labs Reviewed - No data to display  Imaging: No results found.  Allergies  Allergen Reactions   Sulfa Antibiotics Anaphylaxis and Hives   Codeine Rash    Past Medical History:  Diagnosis Date   Anxiety    Asthma    Bipolar 1  disorder (HCC)    Blood transfusion without reported diagnosis    post partum unsure if hemorrhage or not   Complication of anesthesia    aspirated with CS   Depression    History of kidney stones    History of pre-eclampsia in prior pregnancy, currently pregnant    HSV (herpes simplex virus) anogenital infection    Social History   Socioeconomic History   Marital status: Single    Spouse name: Not on file   Number of children: Not on file   Years of education: Not on file   Highest education level: Not on file  Occupational History   Not on file  Tobacco Use   Smoking status: Some Days     Current packs/day: 0.25    Average packs/day: 0.3 packs/day for 15.0 years (3.8 ttl pk-yrs)    Types: Cigarettes   Smokeless tobacco: Never  Vaping Use   Vaping status: Never Used  Substance and Sexual Activity   Alcohol use: No   Drug use: Not Currently   Sexual activity: Yes    Birth control/protection: Surgical    Comment: tubal  Other Topics Concern   Not on file  Social History Narrative   Not on file   Social Determinants of Health   Financial Resource Strain: Low Risk  (10/12/2021)   Overall Financial Resource Strain (CARDIA)    Difficulty of Paying Living Expenses: Not hard at all  Food Insecurity: No Food Insecurity (10/12/2021)   Hunger Vital Sign    Worried About Running Out of Food in the Last Year: Never true    Ran Out of Food in the Last Year: Never true  Transportation Needs: No Transportation Needs (10/12/2021)   PRAPARE - Administrator, Civil Service (Medical): No    Lack of Transportation (Non-Medical): No  Physical Activity: Sufficiently Active (10/12/2021)   Exercise Vital Sign    Days of Exercise per Week: 5 days    Minutes of Exercise per Session: 60 min  Stress: No Stress Concern Present (10/12/2021)   [employer] of Occupational Health - Occupational Stress Questionnaire    Feeling of Stress : Only a little  Social Connections: Moderately Integrated (10/12/2021)   Social Connection and Isolation Panel [NHANES]    Frequency of Communication with Friends and Family: More than three times a week    Frequency of Social Gatherings with Friends and Family: Once a week    Attends Religious Services: More than 4 times per year    Active Member of [employer] or Organizations: Yes    Attends Engineer, structural: More than 4 times per year    Marital Status: Never married  Intimate Partner Violence: Not At Risk (10/12/2021)   Humiliation, Afraid, Rape, and Kick questionnaire    Fear of Current or Ex-Partner: No    Emotionally  Abused: No    Physically Abused: No    Sexually Abused: No   Family History  Problem Relation Age of Onset   Rheum arthritis Paternal Grandmother    Diabetes Maternal Grandmother    Lung cancer Maternal Grandfather    Hypertension Father    Autoimmune disease Father    Sleep apnea Father    Other Mother        sepsis   Asthma Sister    Diabetes Sister    Anxiety disorder Daughter    Healthy Daughter    Past Surgical History:  Procedure Laterality Date   BREAST BIOPSY Right  CESAREAN SECTION     CESAREAN SECTION N/A 12/24/2021   Procedure: CESAREAN SECTION;  Surgeon: Myna Hidalgo, DO;  Location: [facility];  Service: Obstetrics;  Laterality: N/A;   CHOLECYSTECTOMY N/A 08/18/2021   Procedure: LAPAROSCOPIC CHOLECYSTECTOMY;  Surgeon: Franky Macho, MD;  Location: [facility];  Service: General;  Laterality: N/A;   LITHOTRIPSY     MULTIPLE TOOTH EXTRACTIONS     TUBAL LIGATION  12/24/2021   Procedure: BILATERAL TUBAL LIGATION;  Surgeon: Myna Hidalgo, DO;  Location: [facility];  Service: Obstetrics;;     Mardella Layman, MD 06/08/23 1027    Mardella Layman, MD 06/08/23 1027

## 2023-07-04 ENCOUNTER — Other Ambulatory Visit: Payer: Self-pay

## 2023-07-04 ENCOUNTER — Ambulatory Visit
Admission: EM | Admit: 2023-07-04 | Discharge: 2023-07-04 | Disposition: A | Discharge status: Home / Self Care | Payer: MEDICAID | Attending: Nurse Practitioner | Admitting: Nurse Practitioner

## 2023-07-04 DIAGNOSIS — N898 Other specified noninflammatory disorders of vagina: Secondary | ICD-10-CM | POA: Diagnosis present

## 2023-07-04 LAB — POCT URINALYSIS DIP (MANUAL ENTRY)
Bilirubin, UA: NEGATIVE
Glucose, UA: NEGATIVE mg/dL
Ketones, POC UA: NEGATIVE mg/dL
Leukocytes, UA: NEGATIVE
Nitrite, UA: NEGATIVE
Protein Ur, POC: NEGATIVE mg/dL
Spec Grav, UA: >=1.03 — AB (ref 1.010–1.025)
Urobilinogen, UA: 0.2 E.U./dL
pH, UA: 5.5 (ref 5.0–8.0)

## 2023-07-04 MED ORDER — FLUCONAZOLE 150 MG PO TABS: 150.0000 mg | ORAL_TABLET | Freq: Once | ORAL | 0 refills | Status: AC

## 2023-07-04 NOTE — Discharge Instructions (Signed)
Take medication as prescribed. Cytology swab is pending.  Results should be available within the next 24 to 72 hours.  You also have access to your results via MyChart.  If your results are abnormal, you will be contacted and provided treatment. Follow-up as needed.

## 2023-07-04 NOTE — ED Triage Notes (Signed)
Pt reports she has some vaginal irritation x 2 days

## 2023-07-04 NOTE — ED Provider Notes (Signed)
[facility]    CSN: [id number] Arrival date & time: 07/04/23  1734      History   Chief Complaint Chief Complaint  Patient presents with   vaginal  irritation    HPI Bonnie Weaver is a 37 y.o. female.   The history is provided by the patient.   Patient presents for complaints of vaginal irritation that is been present for the past several days.  Patient denies fever, chills, abdominal pain, vaginal odor, vaginal discharge, urinary frequency, urgency, or low back pain.  Patient reports that her last menstrual cycle was on 06/30/2023.  She reports that she has had 1 female partner in the past 90 days, and is also requesting STI testing.  Patient reports that she was treated for bacterial vaginosis approximately 2 weeks ago.  Past Medical History:  Diagnosis Date   Anxiety    Asthma    Bipolar 1 disorder (HCC)    Blood transfusion without reported diagnosis    post partum unsure if hemorrhage or not   Complication of anesthesia    aspirated with CS   Depression    History of kidney stones    History of pre-eclampsia in prior pregnancy, currently pregnant    HSV (herpes simplex virus) anogenital infection     Patient Active Problem List   Diagnosis Date Noted   Pelvic pain 04/05/2022   History of gestational diabetes 10/17/2021   Calculus of gallbladder without cholecystitis without obstruction    History of cesarean delivery 06/27/2021   History of pre-eclampsia in prior pregnancy, currently pregnant 06/27/2021   History of opioid abuse (HCC) 06/27/2021   Suboxone maintenance treatment complicating pregnancy, antepartum (HCC) 06/27/2021   Bipolar, Dep/anx, ADHD 06/27/2021   Smoker 06/27/2021   Pruritus 04/21/2020   Keratosis pilaris 03/15/2020   Dyshidrotic eczema 03/15/2020   Seborrheic keratosis 03/15/2020   Seborrheic dermatitis 03/15/2020   History of asthma 09/21/2019   Genital herpes simplex 09/21/2019    Past Surgical History:  Procedure  Laterality Date   BREAST BIOPSY Right    CESAREAN SECTION     CESAREAN SECTION N/A 12/24/2021   Procedure: CESAREAN SECTION;  Surgeon: Myna Hidalgo, DO;  Location: [facility];  Service: Obstetrics;  Laterality: N/A;   CHOLECYSTECTOMY N/A 08/18/2021   Procedure: LAPAROSCOPIC CHOLECYSTECTOMY;  Surgeon: Franky Macho, MD;  Location: [facility];  Service: General;  Laterality: N/A;   LITHOTRIPSY     MULTIPLE TOOTH EXTRACTIONS     TUBAL LIGATION  12/24/2021   Procedure: BILATERAL TUBAL LIGATION;  Surgeon: Myna Hidalgo, DO;  Location: [facility];  Service: Obstetrics;;    OB History     Gravida  3   Para  2   Term  2   Preterm      AB  1   Living  2      SAB      IAB      Ectopic      Multiple  0   Live Births  2            Home Medications    Prior to Admission medications   Medication Sig Start Date End Date Taking? Authorizing Provider  fluconazole (DIFLUCAN) 150 MG tablet Take 1 tablet (150 mg total) by mouth once for 1 dose. 07/04/23 07/04/23 Yes Ailani Governale-Warren, Sadie Haber, NP  acyclovir (ZOVIRAX) 800 MG tablet Take 800 mg by mouth daily. 07/08/19   [provider]  albuterol (VENTOLIN HFA) 108 (90 Base) MCG/ACT  inhaler 2 puffs every 6 (six) hours as needed for shortness of breath or wheezing. 03/20/19   [provider]  buprenorphine (SUBUTEX) 8 MG SUBL SL tablet Place 4 mg under the tongue 4 (four) times daily. 10/25/21   [provider]  desvenlafaxine (PRISTIQ) 25 MG 24 hr tablet Take 25 mg by mouth daily. 12/12/21   [provider]  ferrous sulfate 325 (65 FE) MG tablet Take 1 tablet (325 mg total) by mouth every other day. Patient not taking: Reported on 01/01/2022 10/17/21   Cheral Marker, CNM  omeprazole (PRILOSEC) 20 MG capsule Take 20 mg by mouth daily as needed (acid reflux).    [provider]  predniSONE (DELTASONE) 20 MG tablet Take 2 tablets (40 mg total) by mouth daily. 06/05/23   Mardella Layman, MD  TRINTELLIX 10  MG TABS tablet Take 10 mg by mouth daily. 12/12/21   [provider]    Family History Family History  Problem Relation Age of Onset   Rheum arthritis Paternal Grandmother    Diabetes Maternal Grandmother    Lung cancer Maternal Grandfather    Hypertension Father    Autoimmune disease Father    Sleep apnea Father    Other Mother        sepsis   Asthma Sister    Diabetes Sister    Anxiety disorder Daughter    Healthy Daughter     Social History Social History   Tobacco Use   Smoking status: Some Days    Current packs/day: 0.25    Average packs/day: 0.3 packs/day for 15.0 years (3.8 ttl pk-yrs)    Types: Cigarettes   Smokeless tobacco: Never  Vaping Use   Vaping status: Never Used  Substance Use Topics   Alcohol use: No   Drug use: Not Currently     Allergies   Sulfa antibiotics and Codeine   Review of Systems Review of Systems Per HPI  Physical Exam Triage Vital Signs ED Triage Vitals  Encounter Vitals Group     BP 07/04/23 1851 127/73     Systolic BP Percentile --      Diastolic BP Percentile --      Pulse Rate 07/04/23 1851 83     Resp 07/04/23 1851 18     Temp 07/04/23 1851 98.2 F (36.8 C)     Temp Source 07/04/23 1851 Oral     SpO2 07/04/23 1851 98 %     Weight --      Height --      Head Circumference --      Peak Flow --      Pain Score 07/04/23 1853 0     Pain Loc --      Pain Education --      Exclude from Growth Chart --    No data found.  Updated Vital Signs BP 127/73 (BP Location: Right Arm)   Pulse 83   Temp 98.2 F (36.8 C) (Oral)   Resp 18   LMP 06/30/2023 (Approximate)   SpO2 98%   Visual Acuity Right Eye Distance:   Left Eye Distance:   Bilateral Distance:    Right Eye Near:   Left Eye Near:    Bilateral Near:     Physical Exam Vitals and nursing note reviewed.  Constitutional:      General: She is not in acute distress.    Appearance: Normal appearance.  HENT:     Head: Normocephalic.  Eyes:  Extraocular Movements: Extraocular movements intact.     Pupils: Pupils are equal, round, and reactive to light.  Cardiovascular:     Rate and Rhythm: Normal rate and regular rhythm.  Pulmonary:     Effort: Pulmonary effort is normal.     Breath sounds: Normal breath sounds.  Abdominal:     General: Bowel sounds are normal.     Palpations: Abdomen is soft.  Genitourinary:    Comments: GU exam deferred, self swab performed  Musculoskeletal:     Cervical back: Normal range of motion.  Skin:    General: Skin is warm and dry.  Neurological:     General: No focal deficit present.     Mental Status: She is alert and oriented to person, place, and time.  Psychiatric:        Mood and Affect: Mood normal.        Behavior: Behavior normal.      UC Treatments / Results  Labs (all labs ordered are listed, but only abnormal results are displayed) Labs Reviewed  POCT URINALYSIS DIP (MANUAL ENTRY) - Abnormal; Notable for the following components:      Result Value   Clarity, UA hazy (*)    Spec Grav, UA >=1.030 (*)    Blood, UA trace-intact (*)    All other components within normal limits  URINE CULTURE  CERVICOVAGINAL ANCILLARY ONLY    EKG   Radiology No results found.  Procedures Procedures (including critical care time)  Medications Ordered in UC Medications - No data to display  Initial Impression / Assessment and Plan / UC Course  I have reviewed the triage vital signs and the nursing notes.  Pertinent labs & imaging results that were available during my care of the patient were reviewed by me and considered in my medical decision making (see chart for details).  The patient is well-appearing, she is in no acute distress, vital signs are stable.  Cytology swab is pending.  Urinalysis is negative, urine culture is pending.  Symptoms appear to be consistent with vaginitis, will treat empirically with fluconazole 150 mg tablet.  Patient was advised she will be contacted  if the pending test results are abnormal.  Encourage condom use with each sexual encounter.  Follow-up as needed.   Final Clinical Impressions(s) / UC Diagnoses   Final diagnoses:  Vaginal irritation     Discharge Instructions      Take medication as prescribed. Cytology swab is pending.  Results should be available within the next 24 to 72 hours.  You also have access to your results via MyChart.  If your results are abnormal, you will be contacted and provided treatment. Follow-up as needed.     ED Prescriptions     Medication Sig Dispense Auth. Provider   fluconazole (DIFLUCAN) 150 MG tablet Take 1 tablet (150 mg total) by mouth once for 1 dose. 1 tablet Monicka Cyran-Warren, Sadie Haber, NP      PDMP not reviewed this encounter.   Abran Cantor, NP 07/04/23 1916

## 2023-07-05 ENCOUNTER — Telehealth: Payer: Self-pay | Admitting: Emergency Medicine

## 2023-07-05 LAB — CERVICOVAGINAL ANCILLARY ONLY
Bacterial Vaginitis (gardnerella): POSITIVE — AB
Candida Glabrata: NEGATIVE
Candida Vaginitis: POSITIVE — AB
Chlamydia: NEGATIVE
Comment: NEGATIVE
Comment: NEGATIVE
Comment: NEGATIVE
Comment: NEGATIVE
Comment: NEGATIVE
Comment: NORMAL
Neisseria Gonorrhea: NEGATIVE
Trichomonas: NEGATIVE

## 2023-07-05 LAB — URINE CULTURE: Culture: NO GROWTH

## 2023-07-05 MED ORDER — METRONIDAZOLE 500 MG PO TABS: 500.0000 mg | ORAL_TABLET | Freq: Two times a day (BID) | ORAL | 0 refills | Status: DC

## 2023-07-05 NOTE — Telephone Encounter (Signed)
Metronidazole for positive BV

## 2023-12-14 ENCOUNTER — Ambulatory Visit
Admission: EM | Admit: 2023-12-14 | Discharge: 2023-12-14 | Disposition: A | Discharge status: Home / Self Care | Payer: MEDICAID | Attending: Nurse Practitioner | Admitting: Nurse Practitioner

## 2023-12-14 ENCOUNTER — Encounter: Payer: Self-pay | Admitting: Emergency Medicine

## 2023-12-14 DIAGNOSIS — R399 Unspecified symptoms and signs involving the genitourinary system: Secondary | ICD-10-CM | POA: Diagnosis not present

## 2023-12-14 DIAGNOSIS — Z113 Encounter for screening for infections with a predominantly sexual mode of transmission: Secondary | ICD-10-CM | POA: Diagnosis not present

## 2023-12-14 DIAGNOSIS — R300 Dysuria: Secondary | ICD-10-CM | POA: Diagnosis present

## 2023-12-14 DIAGNOSIS — N898 Other specified noninflammatory disorders of vagina: Secondary | ICD-10-CM | POA: Insufficient documentation

## 2023-12-14 DIAGNOSIS — R3915 Urgency of urination: Secondary | ICD-10-CM | POA: Insufficient documentation

## 2023-12-14 LAB — POCT URINALYSIS DIP (MANUAL ENTRY)
Glucose, UA: NEGATIVE mg/dL
Nitrite, UA: NEGATIVE
Protein Ur, POC: 30 mg/dL — AB
Spec Grav, UA: >=1.03 — AB (ref 1.010–1.025)
Urobilinogen, UA: 0.2 U/dL
pH, UA: 5.5 (ref 5.0–8.0)

## 2023-12-14 MED ORDER — NITROFURANTOIN MONOHYD MACRO 100 MG PO CAPS: 100.0000 mg | ORAL_CAPSULE | Freq: Two times a day (BID) | ORAL | 0 refills | Status: AC

## 2023-12-14 NOTE — ED Provider Notes (Signed)
 [facility]    CSN: [id number] Arrival date & time: 12/14/23  1503      History   Chief Complaint No chief complaint on file.   HPI Bonnie Weaver is a 38 y.o. female.   The history is provided by the patient.   Patient presents for complaints of pain with urination and urinary urgency.  Symptoms have been present for the past several days.  Patient denies fever, abdominal pain, nausea, vomiting, diarrhea, rash, hematuria, decreased urine stream, flank pain, or low back pain.  Patient also is concern for STI/STD.  States she is experiencing vaginal discharge.  Endorses 1 female partner.  Past Medical History:  Diagnosis Date   Anxiety    Asthma    Bipolar 1 disorder (HCC)    Blood transfusion without reported diagnosis    post partum unsure if hemorrhage or not   Complication of anesthesia    aspirated with CS   Depression    History of kidney stones    History of pre-eclampsia in prior pregnancy, currently pregnant    HSV (herpes simplex virus) anogenital infection     Patient Active Problem List   Diagnosis Date Noted   Pelvic pain 04/05/2022   History of gestational diabetes 10/17/2021   Calculus of gallbladder without cholecystitis without obstruction    History of cesarean delivery 06/27/2021   History of pre-eclampsia in prior pregnancy, currently pregnant 06/27/2021   History of opioid abuse (HCC) 06/27/2021   Suboxone maintenance treatment complicating pregnancy, antepartum (HCC) 06/27/2021   Bipolar, Dep/anx, ADHD 06/27/2021   Smoker 06/27/2021   Pruritus 04/21/2020   Keratosis pilaris 03/15/2020   Dyshidrotic eczema 03/15/2020   Seborrheic keratosis 03/15/2020   Seborrheic dermatitis 03/15/2020   History of asthma 09/21/2019   Genital herpes simplex 09/21/2019    Past Surgical History:  Procedure Laterality Date   BREAST BIOPSY Right    CESAREAN SECTION     CESAREAN SECTION N/A 12/24/2021   Procedure: CESAREAN SECTION;  Surgeon: Myna Hidalgo, DO;  Location: [facility];  Service: Obstetrics;  Laterality: N/A;   CHOLECYSTECTOMY N/A 08/18/2021   Procedure: LAPAROSCOPIC CHOLECYSTECTOMY;  Surgeon: Franky Macho, MD;  Location: [facility];  Service: General;  Laterality: N/A;   LITHOTRIPSY     MULTIPLE TOOTH EXTRACTIONS     TUBAL LIGATION  12/24/2021   Procedure: BILATERAL TUBAL LIGATION;  Surgeon: Myna Hidalgo, DO;  Location: [facility];  Service: Obstetrics;;    OB History     Gravida  3   Para  2   Term  2   Preterm      AB  1   Living  2      SAB      IAB      Ectopic      Multiple  0   Live Births  2            Home Medications    Prior to Admission medications   Medication Sig Start Date End Date Taking? Authorizing Provider  hydrOXYzine (VISTARIL) 25 MG capsule Take 25 mg by mouth 3 (three) times daily as needed.   Yes [provider]  lamoTRIgine (LAMICTAL) 25 MG tablet Take 25 mg by mouth daily.   Yes [provider]  nitrofurantoin, macrocrystal-monohydrate, (MACROBID) 100 MG capsule Take 1 capsule (100 mg total) by mouth 2 (two) times daily. 12/14/23  Yes Leath-Warren, Sadie Haber, NP  acyclovir (ZOVIRAX) 800 MG tablet Take 800 mg by mouth  daily. 07/08/19   [provider]  albuterol (VENTOLIN HFA) 108 (90 Base) MCG/ACT inhaler 2 puffs every 6 (six) hours as needed for shortness of breath or wheezing. 03/20/19   [provider]  buprenorphine (SUBUTEX) 8 MG SUBL SL tablet Place 4 mg under the tongue 4 (four) times daily. 10/25/21   [provider]  desvenlafaxine (PRISTIQ) 25 MG 24 hr tablet Take 25 mg by mouth daily. 12/12/21   [provider]  omeprazole (PRILOSEC) 20 MG capsule Take 20 mg by mouth daily as needed (acid reflux).    [provider]  TRINTELLIX 10 MG TABS tablet Take 10 mg by mouth daily. 12/12/21   [provider]    Family History Family History  Problem Relation Age of Onset   Rheum arthritis Paternal  Grandmother    Diabetes Maternal Grandmother    Lung cancer Maternal Grandfather    Hypertension Father    Autoimmune disease Father    Sleep apnea Father    Other Mother        sepsis   Asthma Sister    Diabetes Sister    Anxiety disorder Daughter    Healthy Daughter     Social History Social History   Tobacco Use   Smoking status: Some Days    Current packs/day: 0.25    Average packs/day: 0.3 packs/day for 15.0 years (3.8 ttl pk-yrs)    Types: Cigarettes   Smokeless tobacco: Never  Vaping Use   Vaping status: Never Used  Substance Use Topics   Alcohol use: No   Drug use: Not Currently     Allergies   Sulfa antibiotics and Codeine   Review of Systems Review of Systems Per HPI  Physical Exam Triage Vital Signs ED Triage Vitals  Encounter Vitals Group     BP 12/14/23 1513 124/83     Systolic BP Percentile --      Diastolic BP Percentile --      Pulse Rate 12/14/23 1513 92     Resp 12/14/23 1513 18     Temp 12/14/23 1513 98.1 F (36.7 C)     Temp Source 12/14/23 1513 Oral     SpO2 12/14/23 1513 95 %     Weight --      Height --      Head Circumference --      Peak Flow --      Pain Score 12/14/23 1514 5     Pain Loc --      Pain Education --      Exclude from Growth Chart --    No data found.  Updated Vital Signs BP 124/83 (BP Location: Right Arm)   Pulse 92   Temp 98.1 F (36.7 C) (Oral)   Resp 18   LMP 11/18/2023 (Exact Date)   SpO2 95%   Visual Acuity Right Eye Distance:   Left Eye Distance:   Bilateral Distance:    Right Eye Near:   Left Eye Near:    Bilateral Near:     Physical Exam Vitals and nursing note reviewed.  Constitutional:      General: She is not in acute distress.    Appearance: Normal appearance.  HENT:     Head: Normocephalic.  Eyes:     Extraocular Movements: Extraocular movements intact.     Pupils: Pupils are equal, round, and reactive to light.  Cardiovascular:     Rate and Rhythm: Normal rate and regular  rhythm.  Pulses: Normal pulses.     Heart sounds: Normal heart sounds.  Pulmonary:     Effort: Pulmonary effort is normal. No respiratory distress.     Breath sounds: Normal breath sounds. No stridor. No wheezing, rhonchi or rales.  Abdominal:     General: Bowel sounds are normal.     Palpations: Abdomen is soft.     Tenderness: There is abdominal tenderness. There is no right CVA tenderness.  Genitourinary:    Comments: GU exam deferred, self swab performed  Skin:    General: Skin is warm and dry.  Neurological:     General: No focal deficit present.     Mental Status: She is alert and oriented to person, place, and time.  Psychiatric:        Mood and Affect: Mood normal.        Behavior: Behavior normal.      UC Treatments / Results  Labs (all labs ordered are listed, but only abnormal results are displayed) Labs Reviewed  POCT URINALYSIS DIP (MANUAL ENTRY) - Abnormal; Notable for the following components:      Result Value   Bilirubin, UA small (*)    Ketones, POC UA trace (5) (*)    Spec Grav, UA >=1.030 (*)    Blood, UA moderate (*)    Protein Ur, POC =30 (*)    Leukocytes, UA Small (1+) (*)    All other components within normal limits  URINE CULTURE  CERVICOVAGINAL ANCILLARY ONLY    EKG   Radiology No results found.  Procedures Procedures (including critical care time)  Medications Ordered in UC Medications - No data to display  Initial Impression / Assessment and Plan / UC Course  I have reviewed the triage vital signs and the nursing notes.  Pertinent labs & imaging results that were available during my care of the patient were reviewed by me and considered in my medical decision making (see chart for details).  Urine culture is pending.  Urinalysis with positive for blood, and small leukocytes.  Given patient's current symptoms, will treat empirically with Macrobid 100 mg twice daily for the next 5 days.  Cytology swab is pending.  Supportive care  recommendations were provided and discussed with the patient to include fluids, over-the-counter analgesics, developing a toileting schedule, and avoiding caffeine.  Patient advised to follow-up with her PCP if urine culture is negative and she is still experiencing symptoms.  Patient advised to refrain from sexual intercourse until her STI results are received.  Patient was in agreement with this plan of care and verbalized understanding.  All questions were answered.  Patient stable for discharge.  Final Clinical Impressions(s) / UC Diagnoses   Final diagnoses:  UTI symptoms  Vaginal discharge     Discharge Instructions      A urine culture has been ordered.  You will be contacted once the pending test result is received.  If the result is negative, you will be advised to stop the antibiotic.  If the medication needs to be changed, you will also be advised. May take over-the-counter Tylenol as needed for pain, fever, or general discomfort. Make sure you are drinking at least 8-10 8 ounce glasses of water daily. Avoid caffeine such as tea, soda, or coffee while symptoms persist. Make sure you are urinating at least every 2 hours. If your urine culture result is negative and you are still experiencing symptoms, please follow-up with your primary care physician for further evaluation.  Your cytology  results are pending.  You will be contacted if the pending test results are positive.  If you receive treatment, you will need to refrain from sexual intercourse for an additional 7 days after completing treatment.  If your test results are positive, you will need to notify our partners.  Increase condom use.  Follow-up as needed.     ED Prescriptions     Medication Sig Dispense Auth. Provider   nitrofurantoin, macrocrystal-monohydrate, (MACROBID) 100 MG capsule Take 1 capsule (100 mg total) by mouth 2 (two) times daily. 10 capsule Leath-Warren, Sadie Haber, NP      PDMP not reviewed this  encounter.   Abran Cantor, NP 12/15/23 1609

## 2023-12-14 NOTE — Discharge Instructions (Signed)
 A urine culture has been ordered.  You will be contacted once the pending test result is received.  If the result is negative, you will be advised to stop the antibiotic.  If the medication needs to be changed, you will also be advised. May take over-the-counter Tylenol as needed for pain, fever, or general discomfort. Make sure you are drinking at least 8-10 8 ounce glasses of water daily. Avoid caffeine such as tea, soda, or coffee while symptoms persist. Make sure you are urinating at least every 2 hours. If your urine culture result is negative and you are still experiencing symptoms, please follow-up with your primary care physician for further evaluation.  Your cytology results are pending.  You will be contacted if the pending test results are positive.  If you receive treatment, you will need to refrain from sexual intercourse for an additional 7 days after completing treatment.  If your test results are positive, you will need to notify our partners.  Increase condom use.  Follow-up as needed.

## 2023-12-14 NOTE — ED Triage Notes (Signed)
 Pain on urination that started this morning and urinary urgency.

## 2023-12-16 ENCOUNTER — Telehealth: Payer: Self-pay

## 2023-12-16 LAB — CERVICOVAGINAL ANCILLARY ONLY
Bacterial Vaginitis (gardnerella): POSITIVE — AB
Candida Glabrata: NEGATIVE
Candida Vaginitis: POSITIVE — AB
Chlamydia: NEGATIVE
Comment: NEGATIVE
Comment: NEGATIVE
Comment: NEGATIVE
Comment: NEGATIVE
Comment: NEGATIVE
Comment: NORMAL
Neisseria Gonorrhea: NEGATIVE
Trichomonas: NEGATIVE

## 2023-12-16 MED ORDER — METRONIDAZOLE 500 MG PO TABS: 500.0000 mg | ORAL_TABLET | Freq: Two times a day (BID) | ORAL | 0 refills | Status: AC

## 2023-12-16 MED ORDER — FLUCONAZOLE 150 MG PO TABS: 150.0000 mg | ORAL_TABLET | Freq: Once | ORAL | 0 refills | Status: AC

## 2023-12-16 NOTE — Telephone Encounter (Signed)
 Per protocol, pt requires tx with metronidazole and Diflucan.  Rx sent to pharmacy on file.

## 2023-12-17 ENCOUNTER — Telehealth: Payer: Self-pay | Admitting: Family Medicine

## 2023-12-17 ENCOUNTER — Telehealth: Payer: Self-pay

## 2023-12-17 LAB — URINE CULTURE: Culture: 60000 — AB

## 2023-12-17 MED ORDER — CEPHALEXIN 500 MG PO CAPS: 500.0000 mg | ORAL_CAPSULE | Freq: Two times a day (BID) | ORAL | 0 refills | Status: AC

## 2023-12-17 NOTE — Telephone Encounter (Signed)
 Returned patients call about the macrobid causing her N/V x 2 days. Per provider, she sent in Keflex for Uti sxs. Informed pt that she can call back if she has a bad reaction to that med as well.

## 2023-12-17 NOTE — Telephone Encounter (Signed)
 Patient calling stating that the Macrobid is causing nausea vomiting and diarrhea and she cannot tolerate it and requesting to be switched to something else for a UTI.  Urine culture sensitive for all for E. coli UTI.  Keflex sent instead, may discontinue the Macrobid.  Follow-up for any worsening symptoms
